# Patient Record
Sex: Female | Born: 1990 | Race: Black or African American | Hispanic: No | Marital: Single | Smoking: Former smoker
Health system: Southern US, Community
[De-identification: ages and names within clinical notes are randomized; demographics above are authoritative.]

## PROBLEM LIST (undated history)

## (undated) ENCOUNTER — Inpatient Hospital Stay (HOSPITAL_COMMUNITY): Payer: Self-pay

## (undated) DIAGNOSIS — B009 Herpesviral infection, unspecified: Secondary | ICD-10-CM

## (undated) DIAGNOSIS — F419 Anxiety disorder, unspecified: Secondary | ICD-10-CM

## (undated) DIAGNOSIS — R519 Headache, unspecified: Secondary | ICD-10-CM

## (undated) DIAGNOSIS — F329 Major depressive disorder, single episode, unspecified: Secondary | ICD-10-CM

## (undated) DIAGNOSIS — A549 Gonococcal infection, unspecified: Secondary | ICD-10-CM

## (undated) DIAGNOSIS — R51 Headache: Secondary | ICD-10-CM

## (undated) DIAGNOSIS — F319 Bipolar disorder, unspecified: Secondary | ICD-10-CM

## (undated) DIAGNOSIS — D649 Anemia, unspecified: Secondary | ICD-10-CM

## (undated) DIAGNOSIS — O1490 Unspecified pre-eclampsia, unspecified trimester: Secondary | ICD-10-CM

## (undated) DIAGNOSIS — R569 Unspecified convulsions: Secondary | ICD-10-CM

## (undated) DIAGNOSIS — Z3490 Encounter for supervision of normal pregnancy, unspecified, unspecified trimester: Secondary | ICD-10-CM

## (undated) HISTORY — PX: NO PAST SURGERIES: SHX2092

---
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
## 2008-01-19 ENCOUNTER — Emergency Department (HOSPITAL_COMMUNITY): Admission: EM | Admit: 2008-01-19 | Discharge: 2008-01-19 | Payer: Self-pay | Admitting: Emergency Medicine

## 2008-05-28 ENCOUNTER — Ambulatory Visit (HOSPITAL_COMMUNITY): Admission: RE | Admit: 2008-05-28 | Discharge: 2008-05-28 | Payer: Self-pay | Admitting: Obstetrics & Gynecology

## 2008-08-09 ENCOUNTER — Inpatient Hospital Stay (HOSPITAL_COMMUNITY): Admission: AD | Admit: 2008-08-09 | Discharge: 2008-08-09 | Payer: Self-pay | Admitting: Obstetrics & Gynecology

## 2008-08-23 ENCOUNTER — Ambulatory Visit: Payer: Self-pay | Admitting: Obstetrics and Gynecology

## 2008-08-23 ENCOUNTER — Inpatient Hospital Stay (HOSPITAL_COMMUNITY): Admission: AD | Admit: 2008-08-23 | Discharge: 2008-08-23 | Payer: Self-pay | Admitting: Obstetrics & Gynecology

## 2008-08-27 ENCOUNTER — Ambulatory Visit: Payer: Self-pay | Admitting: Family Medicine

## 2008-08-27 ENCOUNTER — Inpatient Hospital Stay (HOSPITAL_COMMUNITY): Admission: AD | Admit: 2008-08-27 | Discharge: 2008-08-30 | Payer: Self-pay | Admitting: Family Medicine

## 2009-05-27 ENCOUNTER — Emergency Department (HOSPITAL_COMMUNITY): Admission: EM | Admit: 2009-05-27 | Discharge: 2009-05-27 | Payer: Self-pay | Admitting: Family Medicine

## 2010-02-28 ENCOUNTER — Ambulatory Visit (HOSPITAL_COMMUNITY): Admission: RE | Admit: 2010-02-28 | Discharge: 2010-02-28 | Payer: Self-pay | Admitting: General Surgery

## 2010-04-15 ENCOUNTER — Emergency Department (HOSPITAL_COMMUNITY): Admission: EM | Admit: 2010-04-15 | Discharge: 2010-04-15 | Payer: Self-pay | Admitting: Family Medicine

## 2010-10-09 ENCOUNTER — Emergency Department (HOSPITAL_COMMUNITY): Admission: EM | Admit: 2010-10-09 | Discharge: 2010-08-14 | Payer: Self-pay | Admitting: Emergency Medicine

## 2011-01-15 LAB — URINALYSIS, ROUTINE W REFLEX MICROSCOPIC
Bilirubin Urine: NEGATIVE
Glucose, UA: NEGATIVE mg/dL
Hgb urine dipstick: NEGATIVE
Nitrite: NEGATIVE
Protein, ur: 30 mg/dL — AB
Specific Gravity, Urine: 1.033 — ABNORMAL HIGH (ref 1.005–1.030)
Urobilinogen, UA: 2 mg/dL — ABNORMAL HIGH (ref 0.0–1.0)
pH: 6 (ref 5.0–8.0)

## 2011-01-15 LAB — URINE MICROSCOPIC-ADD ON

## 2011-01-15 LAB — POCT PREGNANCY, URINE: Preg Test, Ur: NEGATIVE

## 2011-01-15 LAB — WET PREP, GENITAL
Trich, Wet Prep: NONE SEEN
Yeast Wet Prep HPF POC: NONE SEEN

## 2011-01-15 LAB — GC/CHLAMYDIA PROBE AMP, GENITAL
Chlamydia, DNA Probe: POSITIVE — AB
GC Probe Amp, Genital: NEGATIVE

## 2011-01-19 LAB — POCT URINALYSIS DIP (DEVICE)
Bilirubin Urine: NEGATIVE
Glucose, UA: NEGATIVE mg/dL
Hgb urine dipstick: NEGATIVE
Ketones, ur: NEGATIVE mg/dL
Nitrite: NEGATIVE
Protein, ur: NEGATIVE mg/dL
Specific Gravity, Urine: 1.025 (ref 1.005–1.030)
Urobilinogen, UA: 1 mg/dL (ref 0.0–1.0)
pH: 6.5 (ref 5.0–8.0)

## 2011-01-19 LAB — URINE CULTURE: Colony Count: 50000

## 2011-01-19 LAB — POCT PREGNANCY, URINE: Preg Test, Ur: NEGATIVE

## 2011-01-20 LAB — HCG, SERUM, QUALITATIVE: Preg, Serum: NEGATIVE

## 2011-01-20 LAB — DIFFERENTIAL
Basophils Absolute: 0.1 10*3/uL (ref 0.0–0.1)
Basophils Relative: 1 % (ref 0–1)
Eosinophils Absolute: 0.1 10*3/uL (ref 0.0–0.7)
Eosinophils Relative: 2 % (ref 0–5)
Lymphocytes Relative: 40 % (ref 12–46)
Lymphs Abs: 2.2 10*3/uL (ref 0.7–4.0)
Monocytes Absolute: 0.4 10*3/uL (ref 0.1–1.0)
Monocytes Relative: 8 % (ref 3–12)
Neutro Abs: 2.7 10*3/uL (ref 1.7–7.7)
Neutrophils Relative %: 49 % (ref 43–77)

## 2011-01-20 LAB — CBC
HCT: 37.6 % (ref 36.0–46.0)
Hemoglobin: 12.5 g/dL (ref 12.0–15.0)
MCHC: 33.1 g/dL (ref 30.0–36.0)
MCV: 84.9 fL (ref 78.0–100.0)
Platelets: 230 10*3/uL (ref 150–400)
RBC: 4.43 MIL/uL (ref 3.87–5.11)
RDW: 13.2 % (ref 11.5–15.5)
WBC: 5.5 10*3/uL (ref 4.0–10.5)

## 2011-02-08 LAB — GC/CHLAMYDIA PROBE AMP, GENITAL
Chlamydia, DNA Probe: NEGATIVE
GC Probe Amp, Genital: NEGATIVE

## 2011-02-08 LAB — POCT URINALYSIS DIP (DEVICE)
Bilirubin Urine: NEGATIVE
Glucose, UA: NEGATIVE mg/dL
Hgb urine dipstick: NEGATIVE
Ketones, ur: NEGATIVE mg/dL
Nitrite: NEGATIVE
Protein, ur: NEGATIVE mg/dL
Specific Gravity, Urine: 1.015 (ref 1.005–1.030)
Urobilinogen, UA: 2 mg/dL — ABNORMAL HIGH (ref 0.0–1.0)
pH: 6.5 (ref 5.0–8.0)

## 2011-02-08 LAB — WET PREP, GENITAL
Clue Cells Wet Prep HPF POC: NONE SEEN
Trich, Wet Prep: NONE SEEN
WBC, Wet Prep HPF POC: NONE SEEN
Yeast Wet Prep HPF POC: NONE SEEN

## 2011-02-10 ENCOUNTER — Inpatient Hospital Stay (INDEPENDENT_AMBULATORY_CARE_PROVIDER_SITE_OTHER)
Admission: RE | Admit: 2011-02-10 | Discharge: 2011-02-10 | Disposition: A | Discharge status: Home / Self Care | Payer: Medicaid Other | Source: Ambulatory Visit | Attending: Emergency Medicine | Admitting: Emergency Medicine

## 2011-02-10 DIAGNOSIS — K2970 Gastritis, unspecified, without bleeding: Secondary | ICD-10-CM

## 2011-02-10 DIAGNOSIS — R350 Frequency of micturition: Secondary | ICD-10-CM

## 2011-02-10 LAB — POCT URINALYSIS DIP (DEVICE)
Bilirubin Urine: NEGATIVE
Glucose, UA: NEGATIVE mg/dL
Nitrite: NEGATIVE
pH: 6 (ref 5.0–8.0)

## 2011-02-11 LAB — URINE CULTURE

## 2011-08-03 LAB — CBC
HCT: 28.8 — ABNORMAL LOW
Hemoglobin: 9 — ABNORMAL LOW
RDW: 14.7
WBC: 9.3

## 2012-04-14 IMAGING — US US PELVIS COMPLETE MODIFY
1 series · 13 of 25 positions shown · non-contrast
Comparison: None.

CLINICAL DATA: Abdominal pain.  Evaluate for torsion.  Right pelvic
pain.

TRANSABDOMINAL AND TRANSVAGINAL ULTRASOUND OF PELVIS
DOPPLER ULTRASOUND OF OVARIES
TECHNIQUE: Both transabdominal and transvaginal ultrasound
examinations of the pelvis were performed including evaluation of
the uterus, ovaries, adnexal regions, and pelvic cul-de-sac.
Transabdominal technique was performed for global imaging of the
pelvis.  Transvaginal technique was performed for detailed
evaluation of the endometrium and/or ovaries.  Color and duplex
Doppler ultrasound was utilized to evaluate blood flow to the
ovaries.

[Series 1: us pelvis complete modify · 0.19mm/px · 13 of 44 slices shown]
[im 1/44]
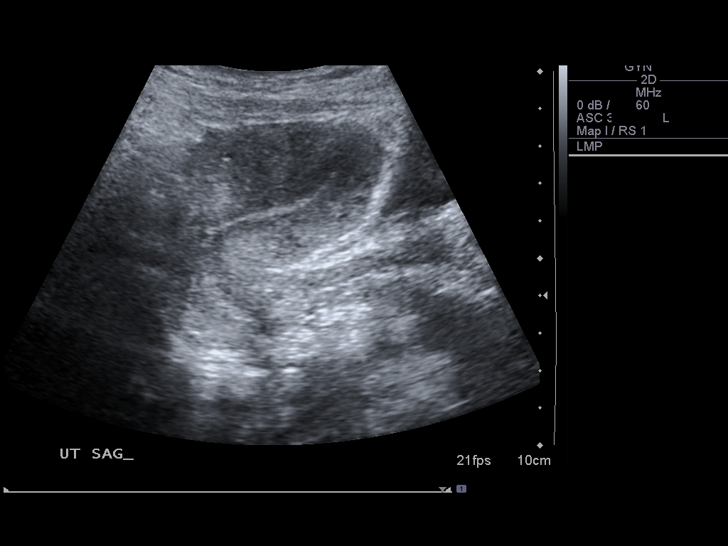
[im 4/44]
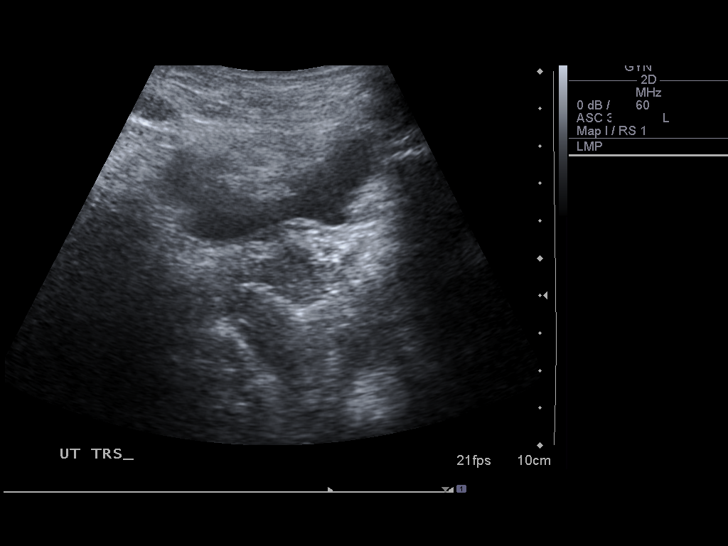
[im 8/44]
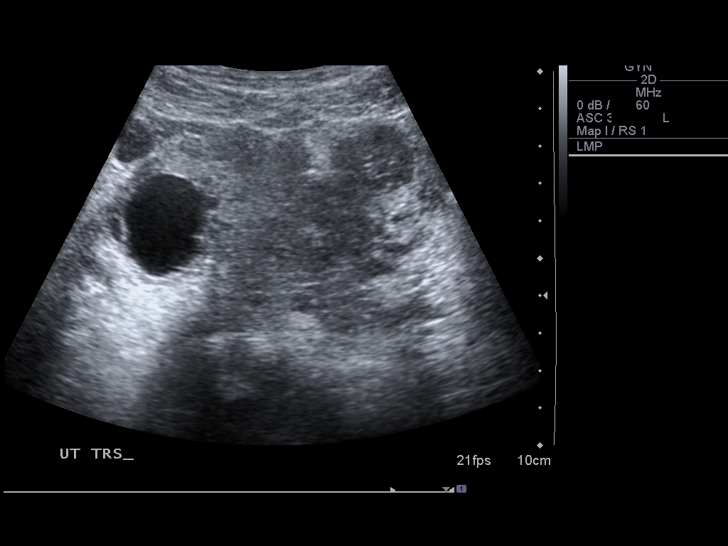
[im 11/44]
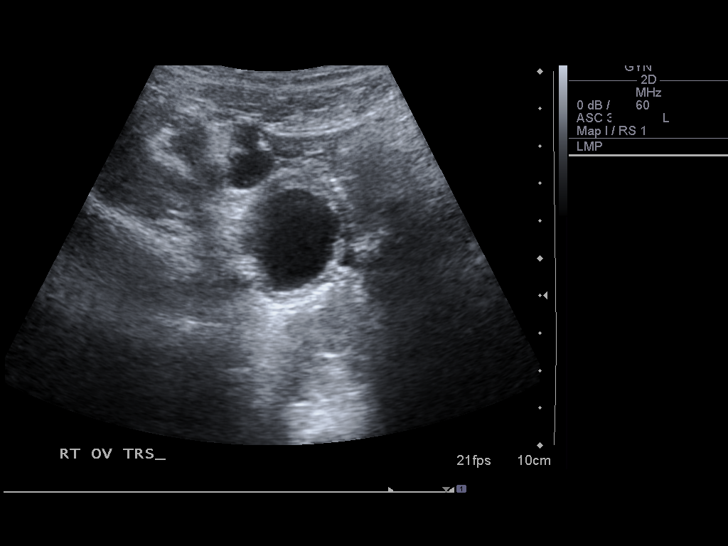
[im 15/44]
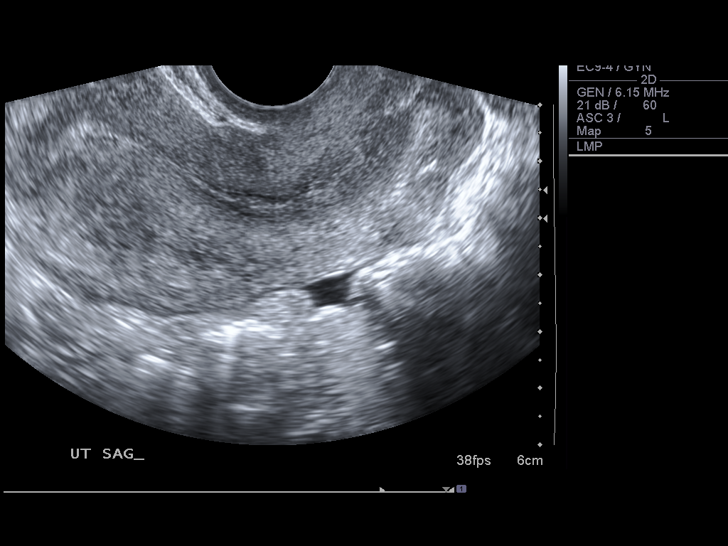
[im 18/44]
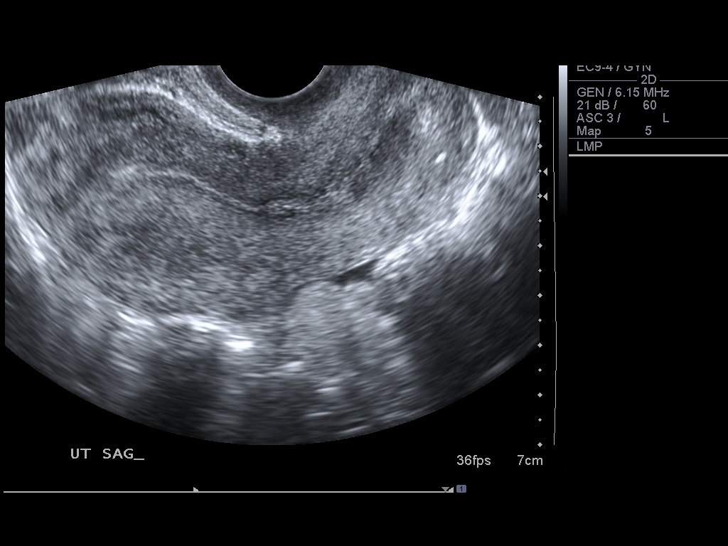
[im 22/44]
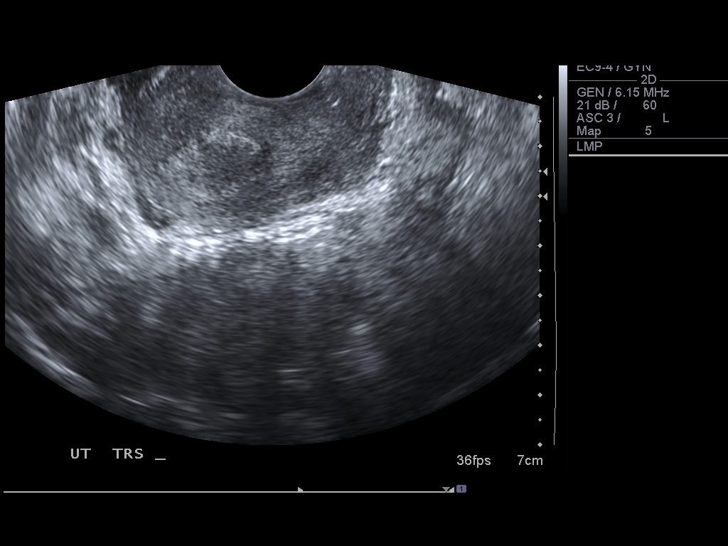
[im 26/44]
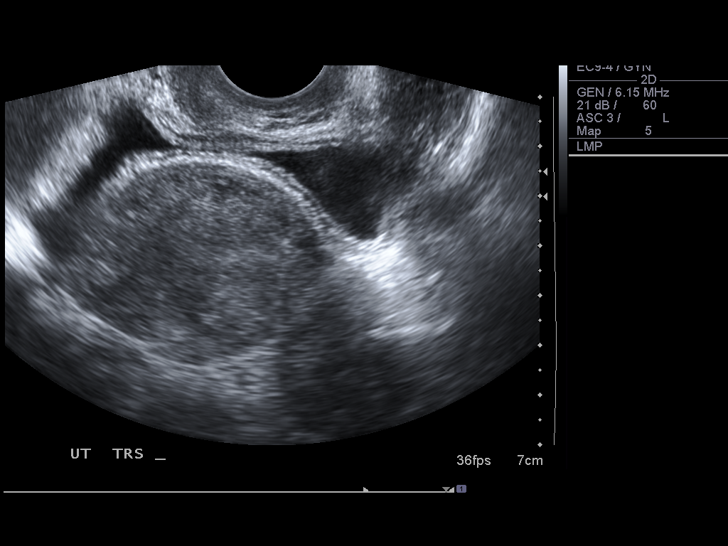
[im 29/44]
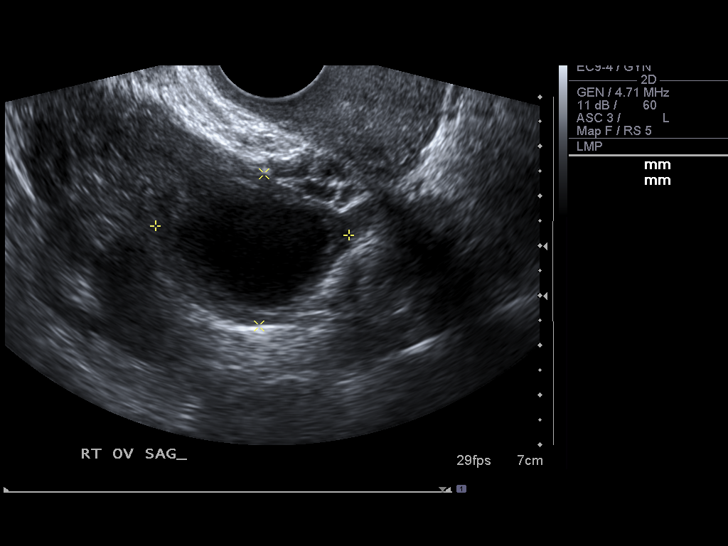
[im 33/44]
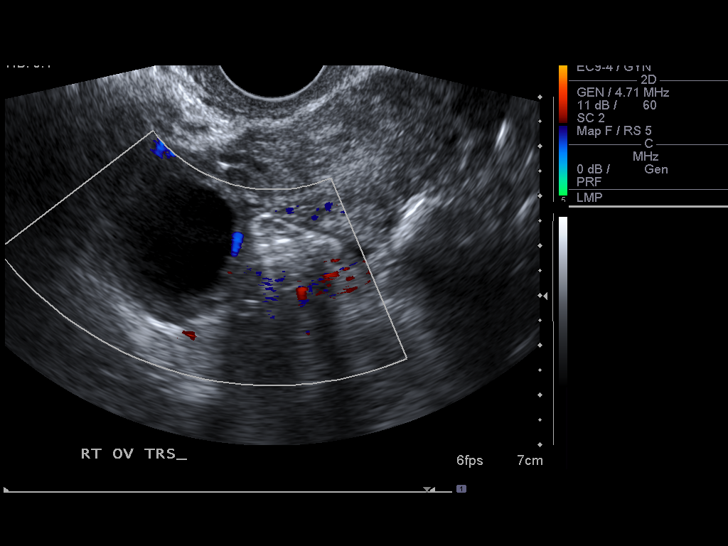
[im 36/44]
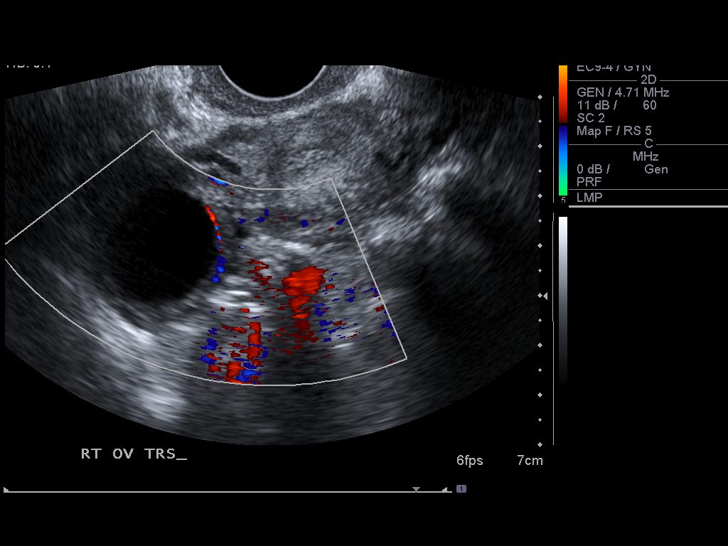
[im 40/44]
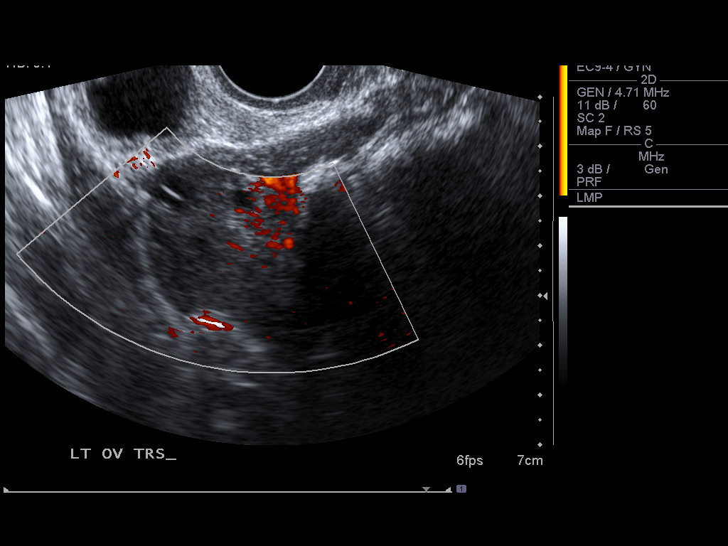
[im 44/44]
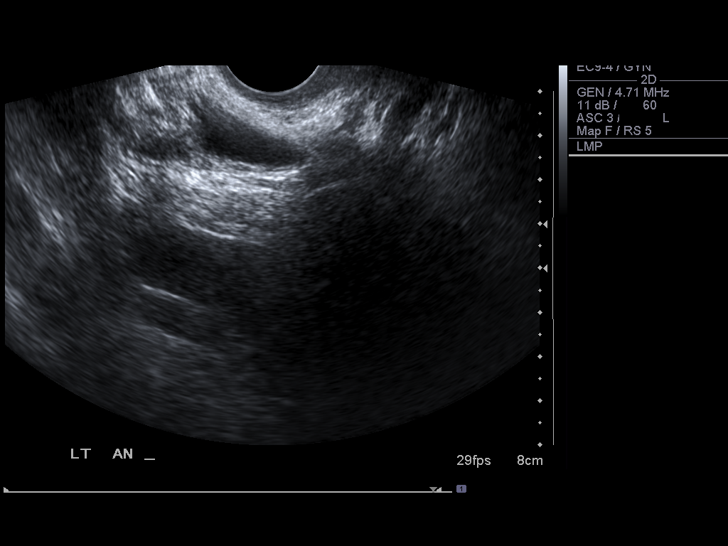

[13 of 25 positions shown; findings below may reference images not displayed]

FINDINGS: Uterus measures 9.3 x 4.5 x 5.9 cm, within normal limits.

Endometrium measures 3 mm, within normal limits.

Right Ovary measures 3.9 x 3.1 x 3.1 cm.  A dominant 2.7 x 2.3 x
2.2 cm cyst is present.  Normal arterial and venous waveforms are
present.

Left Ovary measures 3.7 x 3.2 x 1.9 cm.  It is of normal size and
echotexture.  Normal arterial and venous waveforms are present.

Other Findings:  A small amount of free fluid is present.
IMPRESSION: 1.  Normal arterial and venous waveforms are present in the ovaries
bilaterally without evidence for torsion.
2.  The dominant 2.7 cm cyst is present within the right ovary.
3.  Minimal free fluid is likely physiologic.

## 2012-10-19 ENCOUNTER — Encounter (HOSPITAL_COMMUNITY): Payer: Self-pay

## 2012-10-19 ENCOUNTER — Emergency Department (HOSPITAL_COMMUNITY)
Admission: EM | Admit: 2012-10-19 | Discharge: 2012-10-19 | Disposition: A | Discharge status: Home / Self Care | Payer: Medicaid Other | Attending: Emergency Medicine | Admitting: Emergency Medicine

## 2012-10-19 DIAGNOSIS — Z3202 Encounter for pregnancy test, result negative: Secondary | ICD-10-CM | POA: Insufficient documentation

## 2012-10-19 DIAGNOSIS — R350 Frequency of micturition: Secondary | ICD-10-CM | POA: Insufficient documentation

## 2012-10-19 DIAGNOSIS — R1084 Generalized abdominal pain: Secondary | ICD-10-CM | POA: Insufficient documentation

## 2012-10-19 DIAGNOSIS — N390 Urinary tract infection, site not specified: Secondary | ICD-10-CM | POA: Insufficient documentation

## 2012-10-19 DIAGNOSIS — N730 Acute parametritis and pelvic cellulitis: Secondary | ICD-10-CM | POA: Insufficient documentation

## 2012-10-19 LAB — WET PREP, GENITAL
Trich, Wet Prep: NONE SEEN
Yeast Wet Prep HPF POC: NONE SEEN

## 2012-10-19 LAB — URINE MICROSCOPIC-ADD ON

## 2012-10-19 LAB — URINALYSIS, ROUTINE W REFLEX MICROSCOPIC
Glucose, UA: NEGATIVE mg/dL
Hgb urine dipstick: NEGATIVE
Specific Gravity, Urine: 1.03 (ref 1.005–1.030)

## 2012-10-19 MED ORDER — MORPHINE SULFATE 4 MG/ML IJ SOLN
4.0000 mg | Freq: Once | INTRAMUSCULAR | Status: AC
  Administered 2012-10-19: 4 mg via INTRAVENOUS
  Filled 2012-10-19: qty 1

## 2012-10-19 MED ORDER — SULFAMETHOXAZOLE-TRIMETHOPRIM 800-160 MG PO TABS: 1.0000 | ORAL_TABLET | Freq: Two times a day (BID) | ORAL | Status: DC

## 2012-10-19 MED ORDER — OXYCODONE-ACETAMINOPHEN 5-325 MG PO TABS: 1.0000 | ORAL_TABLET | ORAL | Status: DC | PRN

## 2012-10-19 MED ORDER — AZITHROMYCIN 250 MG PO TABS
1000.0000 mg | ORAL_TABLET | Freq: Once | ORAL | Status: AC
  Administered 2012-10-19: 1000 mg via ORAL
  Filled 2012-10-19: qty 4

## 2012-10-19 MED ORDER — DEXTROSE 5 % IV SOLN
1.0000 g | Freq: Once | INTRAVENOUS | Status: AC
  Administered 2012-10-19: 1 g via INTRAVENOUS
  Filled 2012-10-19: qty 10

## 2012-10-19 MED ORDER — SODIUM CHLORIDE 0.9 % IV SOLN
Freq: Once | INTRAVENOUS | Status: AC
  Administered 2012-10-19: 21:00:00 via INTRAVENOUS

## 2012-10-19 MED ORDER — DOXYCYCLINE HYCLATE 100 MG PO CAPS: 100.0000 mg | ORAL_CAPSULE | Freq: Two times a day (BID) | ORAL | Status: DC

## 2012-10-19 NOTE — ED Provider Notes (Signed)
History     CSN: [id number]  Arrival date & time 10/19/12  1948   First MD Initiated Contact with Patient 10/19/12 2005      Chief Complaint  Patient presents with  . Near Syncope    (Consider location/radiation/quality/duration/timing/severity/associated sxs/prior treatment) HPI History provided by pt.   Pt presents w/ severe, diffuse lower abdominal pain for the past 2 weeks.  Radiates around to both side of low back and in the past 2-3 days has been aggravated by walking and changing positions.  Associated w/ increased urinary frequency, but no fever, N/V/D, vaginal or other urinary sx.  LMP 10/08/12.  Had unprotected sex approx 2 weeks prior to onset of sx.  No h/o STD.    History reviewed. No pertinent past medical history.  History reviewed. No pertinent past surgical history.  No family history on file.  History  Substance Use Topics  . Smoking status: Not on file  . Smokeless tobacco: Not on file  . Alcohol Use: Not on file    OB History    Grav Para Term Preterm Abortions TAB SAB Ect Mult Living                  Review of Systems  All other systems reviewed and are negative.    Allergies  Review of patient's allergies indicates no known allergies.  Home Medications  No current outpatient prescriptions on file.  BP 115/69  Pulse 114  Temp 98 F (36.7 C) (Oral)  Resp 20  SpO2 98%  Physical Exam  Nursing note and vitals reviewed. Constitutional: She is oriented to person, place, and time. She appears well-developed and well-nourished. No distress.       Pt appears uncomfortable  HENT:  Head: Normocephalic and atraumatic.  Eyes:       Normal appearance  Neck: Normal range of motion.  Cardiovascular: Normal rate and regular rhythm.        tachycardic  Pulmonary/Chest: Effort normal and breath sounds normal. No respiratory distress.  Abdominal: Soft. Bowel sounds are normal. She exhibits no distension and no mass. There is no rebound and no  guarding.       Diffuse, mod-severe lower abd ttp.   Genitourinary:       No CVA tenderness.  Nml external genitalia.  Mod amt purulent vaginal discharge.  Cervix closed and appears nml.  Cervical motion and bilateral adnexal ttp.  Musculoskeletal: Normal range of motion.  Neurological: She is alert and oriented to person, place, and time.  Skin: Skin is warm and dry. No rash noted.  Psychiatric: She has a normal mood and affect. Her behavior is normal.    ED Course  Procedures (including critical care time)  Labs Reviewed  URINALYSIS, ROUTINE W REFLEX MICROSCOPIC - Abnormal; Notable for the following:    Color, Urine AMBER (*)  BIOCHEMICALS MAY BE AFFECTED BY COLOR   APPearance TURBID (*)     Ketones, ur 15 (*)     Protein, ur 30 (*)     Urobilinogen, UA 4.0 (*)     Leukocytes, UA LARGE (*)     All other components within normal limits  WET PREP, GENITAL - Abnormal; Notable for the following:    Clue Cells Wet Prep HPF POC FEW (*)     WBC, Wet Prep HPF POC TOO NUMEROUS TO COUNT (*)     All other components within normal limits  URINE MICROSCOPIC-ADD ON - Abnormal; Notable for the following:  Squamous Epithelial / LPF MANY (*)     Bacteria, UA FEW (*)     All other components within normal limits  POCT PREGNANCY, URINE  GC/CHLAMYDIA PROBE AMP  URINE CULTURE   No results found.   1. Urinary tract infection   2. PID (acute pelvic inflammatory disease)       MDM  Healthy 21yo F presents w/ pelvic pain x 2 weeks + increased urinary frequency.  On exam, uncomfortable but non-toxic appearing, afebrile, mildly tachycardic, diffuse lower abd tenderness, purulent vaginal discharge and diffuse tenderness on bimanual exam.  U/A positive for infection and wet prep pos for tntc WBCs.  Will treat pt for UTI and PID.  Received IV NS, rocephin and morphine and po zithromax in ED and D/c'd home w/ percocet, doxycycline and bactrim.  Strict return precautions discussed.  She is aware that  [facility] has an ED.          Otilio Miu, PA-C 10/19/12 [phone]

## 2012-10-19 NOTE — ED Notes (Signed)
Pelvic cart at bedside. 

## 2012-10-19 NOTE — ED Notes (Signed)
Pt had near syncopal episode, constipation, pain in lower abd two weeks.  +orthostatics with EMS.

## 2012-10-19 NOTE — ED Notes (Signed)
Provided pt with crackers and sprite

## 2012-10-19 NOTE — ED Notes (Signed)
Pt had NS with EMS.

## 2012-10-20 LAB — GC/CHLAMYDIA PROBE AMP: CT Probe RNA: POSITIVE — AB

## 2012-10-20 NOTE — ED Provider Notes (Signed)
Medical screening examination/treatment/procedure(s) were performed by non-physician practitioner and as supervising physician I was immediately available for consultation/collaboration.   Kynisha Memon B. Aryav Wimberly, MD 10/20/12 1505 

## 2012-10-21 LAB — URINE CULTURE

## 2012-10-21 NOTE — ED Notes (Signed)
+  Chlamydia. Patient treated with Rocephin and Zithromax. [facility] faxed. 

## 2012-10-22 ENCOUNTER — Telehealth (HOSPITAL_COMMUNITY): Payer: Self-pay | Admitting: Emergency Medicine

## 2012-10-22 NOTE — ED Notes (Signed)
+  Urine. Patient given Doxycycline and Septra DS. No sensitivities listed. Chart sent to EDP office for review.

## 2012-10-23 NOTE — ED Notes (Signed)
Unable to contact patient via phone. Sent letter. °

## 2012-10-23 NOTE — ED Notes (Signed)
Patient treated per protocol. Chart sent to Medical Records. °

## 2012-10-31 NOTE — ED Notes (Signed)
Patient called after receiving letter..was infomed that resullts of her CT probe was positive and she was treat appropriately while here. Sexual educuation reviewed with patient..verbalized understanding

## 2012-11-05 NOTE — ED Notes (Signed)
Chart returned from EDP office. Per Azalia Bilis MD, likely contaminant. No indication for tx. PCP follow-up.

## 2012-11-23 ENCOUNTER — Other Ambulatory Visit: Payer: Medicaid Other

## 2013-02-26 ENCOUNTER — Encounter (HOSPITAL_COMMUNITY): Payer: Self-pay | Admitting: Emergency Medicine

## 2013-02-26 ENCOUNTER — Emergency Department (HOSPITAL_COMMUNITY)
Admission: EM | Admit: 2013-02-26 | Discharge: 2013-02-26 | Disposition: A | Discharge status: Home / Self Care | Payer: Medicaid Other | Attending: Emergency Medicine | Admitting: Emergency Medicine

## 2013-02-26 DIAGNOSIS — Z8669 Personal history of other diseases of the nervous system and sense organs: Secondary | ICD-10-CM | POA: Insufficient documentation

## 2013-02-26 DIAGNOSIS — G43909 Migraine, unspecified, not intractable, without status migrainosus: Secondary | ICD-10-CM | POA: Insufficient documentation

## 2013-02-26 HISTORY — DX: Unspecified convulsions: R56.9

## 2013-02-26 MED ORDER — IBUPROFEN 800 MG PO TABS: 800.0000 mg | ORAL_TABLET | Freq: Three times a day (TID) | ORAL | Status: DC | PRN

## 2013-02-26 MED ORDER — METOCLOPRAMIDE HCL 5 MG/ML IJ SOLN
10.0000 mg | Freq: Once | INTRAMUSCULAR | Status: AC
  Administered 2013-02-26: 10 mg via INTRAVENOUS
  Filled 2013-02-26: qty 2

## 2013-02-26 MED ORDER — DIPHENHYDRAMINE HCL 50 MG/ML IJ SOLN
25.0000 mg | Freq: Once | INTRAMUSCULAR | Status: AC
  Administered 2013-02-26: 25 mg via INTRAVENOUS
  Filled 2013-02-26: qty 1

## 2013-02-26 MED ORDER — KETOROLAC TROMETHAMINE 30 MG/ML IJ SOLN
30.0000 mg | Freq: Once | INTRAMUSCULAR | Status: AC
  Administered 2013-02-26: 30 mg via INTRAVENOUS
  Filled 2013-02-26: qty 1

## 2013-02-26 MED ORDER — SODIUM CHLORIDE 0.9 % IV BOLUS (SEPSIS)
2000.0000 mL | Freq: Once | INTRAVENOUS | Status: AC
  Administered 2013-02-26: 2000 mL via INTRAVENOUS

## 2013-02-26 NOTE — ED Notes (Signed)
Pt. Stated, I started having a headache about 2 weeks ago.

## 2013-02-26 NOTE — ED Provider Notes (Signed)
Medical screening examination/treatment/procedure(s) were performed by non-physician practitioner and as supervising physician I was immediately available for consultation/collaboration.  Anandi Abramo R. Eveline Sauve, MD 02/26/13 1537 

## 2013-02-26 NOTE — ED Provider Notes (Signed)
History     CSN: [id number]  Arrival date & time 02/26/13  1054   First MD Initiated Contact with Patient 02/26/13 1126      Chief Complaint  Patient presents with  . Headache    (Consider location/radiation/quality/duration/timing/severity/associated sxs/prior treatment) HPI Patient presents to the emergency department with migraine headache for the last 7.  Patient, states she's had mild headache over the last 2 weeks, but increased over the last 7 days.  Patient denies numbness, weakness, chest pain, shortness of breath, blurred vision, body aches, dysuria, dizziness, or syncope.  Patient  states she has photophobia.  Patient also, states she has nausea, as well. Past Medical History  Diagnosis Date  . Seizure     History reviewed. No pertinent past surgical history.  No family history on file.  History  Substance Use Topics  . Smoking status: Not on file  . Smokeless tobacco: Not on file  . Alcohol Use: Yes    OB History   Grav Para Term Preterm Abortions TAB SAB Ect Mult Living                  Review of Systems All other systems negative except as documented in the HPI. All pertinent positives and negatives as reviewed in the HPI. Allergies  Review of patient's allergies indicates no known allergies.  Home Medications   Current Outpatient Rx  Name  Route  Sig  Dispense  Refill  . Ibuprofen (IBU PO)   Oral   Take 3-4 tablets by mouth once.         . Naproxen Sodium (ALEVE PO)   Oral   Take 2 capsules by mouth 2 (two) times daily as needed (pain).           BP 119/74  Pulse 87  Temp(Src) 97.9 F (36.6 C) (Oral)  Resp 18  SpO2 100%  LMP 01/20/2013  Physical Exam  Nursing note and vitals reviewed. Constitutional: She is oriented to person, place, and time. She appears well-developed and well-nourished.  HENT:  Head: Normocephalic and atraumatic.  Mouth/Throat: Oropharynx is clear and moist.  Eyes: Pupils are equal, round, and reactive to  light.  Neck: Normal range of motion. Neck supple.  Cardiovascular: Normal rate, regular rhythm and normal heart sounds.  Exam reveals no gallop and no friction rub.   No murmur heard. Pulmonary/Chest: Effort normal and breath sounds normal. No respiratory distress.  Neurological: She is alert and oriented to person, place, and time. She has normal reflexes. No cranial nerve deficit. She exhibits normal muscle tone. Coordination normal.  Skin: Skin is warm and dry. No rash noted. No erythema.    ED Course  Procedures (including critical care time) Patient has no neurological deficits noted on exam.  Patient was treated for migraine headache, and has relief of her symptoms.  Patient is advised to return here for any worsening in her condition. MDM          Carlyle Dolly, PA-C 02/26/13 1329

## 2013-02-26 NOTE — ED Notes (Signed)
Pt requesting food.  States she feels much better and is ready to go home.

## 2013-04-22 ENCOUNTER — Emergency Department (HOSPITAL_COMMUNITY): Payer: Medicaid Other

## 2013-04-22 ENCOUNTER — Emergency Department (HOSPITAL_COMMUNITY)
Admission: EM | Admit: 2013-04-22 | Discharge: 2013-04-22 | Disposition: A | Discharge status: Home / Self Care | Payer: Medicaid Other | Attending: Emergency Medicine | Admitting: Emergency Medicine

## 2013-04-22 ENCOUNTER — Encounter (HOSPITAL_COMMUNITY): Payer: Self-pay | Admitting: *Deleted

## 2013-04-22 DIAGNOSIS — R0789 Other chest pain: Secondary | ICD-10-CM | POA: Insufficient documentation

## 2013-04-22 DIAGNOSIS — Z8669 Personal history of other diseases of the nervous system and sense organs: Secondary | ICD-10-CM | POA: Insufficient documentation

## 2013-04-22 DIAGNOSIS — N390 Urinary tract infection, site not specified: Secondary | ICD-10-CM | POA: Insufficient documentation

## 2013-04-22 DIAGNOSIS — R51 Headache: Secondary | ICD-10-CM | POA: Insufficient documentation

## 2013-04-22 DIAGNOSIS — Z3202 Encounter for pregnancy test, result negative: Secondary | ICD-10-CM | POA: Insufficient documentation

## 2013-04-22 DIAGNOSIS — N730 Acute parametritis and pelvic cellulitis: Secondary | ICD-10-CM | POA: Insufficient documentation

## 2013-04-22 DIAGNOSIS — R0602 Shortness of breath: Secondary | ICD-10-CM | POA: Insufficient documentation

## 2013-04-22 LAB — BASIC METABOLIC PANEL
BUN: 11 mg/dL (ref 6–23)
CO2: 27 mEq/L (ref 19–32)
Chloride: 105 mEq/L (ref 96–112)
GFR calc Af Amer: >90 mL/min (ref >90)
Potassium: 3.8 mEq/L (ref 3.5–5.1)

## 2013-04-22 LAB — URINALYSIS, ROUTINE W REFLEX MICROSCOPIC
Glucose, UA: NEGATIVE mg/dL
Ketones, ur: NEGATIVE mg/dL
Nitrite: NEGATIVE
Protein, ur: NEGATIVE mg/dL
Urobilinogen, UA: 1 mg/dL (ref 0.0–1.0)

## 2013-04-22 LAB — CBC WITH DIFFERENTIAL/PLATELET
Basophils Absolute: 0.1 10*3/uL (ref 0.0–0.1)
Basophils Relative: 1 % (ref 0–1)
Eosinophils Absolute: 0.2 10*3/uL (ref 0.0–0.7)
Eosinophils Relative: 4 % (ref 0–5)
HCT: 37.8 % (ref 36.0–46.0)
Hemoglobin: 12 g/dL (ref 12.0–15.0)
Lymphocytes Relative: 29 % (ref 12–46)
MCHC: 31.7 g/dL (ref 30.0–36.0)
MCV: 82.7 fL (ref 78.0–100.0)
Neutro Abs: 3.8 10*3/uL (ref 1.7–7.7)

## 2013-04-22 LAB — URINE MICROSCOPIC-ADD ON

## 2013-04-22 LAB — WET PREP, GENITAL: Yeast Wet Prep HPF POC: NONE SEEN

## 2013-04-22 LAB — POCT PREGNANCY, URINE: Preg Test, Ur: NEGATIVE

## 2013-04-22 MED ORDER — OXYCODONE-ACETAMINOPHEN 5-325 MG PO TABS: 1.0000 | ORAL_TABLET | Freq: Four times a day (QID) | ORAL | Status: DC | PRN

## 2013-04-22 MED ORDER — METRONIDAZOLE 500 MG PO TABS: 500.0000 mg | ORAL_TABLET | Freq: Two times a day (BID) | ORAL | Status: DC

## 2013-04-22 MED ORDER — PROCHLORPERAZINE EDISYLATE 5 MG/ML IJ SOLN
10.0000 mg | Freq: Once | INTRAMUSCULAR | Status: AC
  Administered 2013-04-22: 10 mg via INTRAVENOUS
  Filled 2013-04-22: qty 2

## 2013-04-22 MED ORDER — DOXYCYCLINE HYCLATE 100 MG PO CAPS: 100.0000 mg | ORAL_CAPSULE | Freq: Two times a day (BID) | ORAL | Status: DC

## 2013-04-22 MED ORDER — CEFTRIAXONE SODIUM 250 MG IJ SOLR
250.0000 mg | Freq: Once | INTRAMUSCULAR | Status: AC
  Administered 2013-04-22: 250 mg via INTRAMUSCULAR
  Filled 2013-04-22: qty 250

## 2013-04-22 MED ORDER — MORPHINE SULFATE 4 MG/ML IJ SOLN
4.0000 mg | Freq: Once | INTRAMUSCULAR | Status: AC
  Administered 2013-04-22: 4 mg via INTRAVENOUS
  Filled 2013-04-22: qty 1

## 2013-04-22 MED ORDER — ONDANSETRON HCL 4 MG/2ML IJ SOLN
4.0000 mg | Freq: Once | INTRAMUSCULAR | Status: AC
  Administered 2013-04-22: 4 mg via INTRAVENOUS
  Filled 2013-04-22: qty 2

## 2013-04-22 NOTE — ED Notes (Signed)
Secretary paged [place] tech

## 2013-04-22 NOTE — ED Notes (Signed)
Bed:WA22<BR> Expected date:<BR> Expected time:<BR> Means of arrival:<BR> Comments:<BR>

## 2013-04-22 NOTE — ED Notes (Signed)
Pt states she has been having lower abd pain that has been getting worse. Headache started this morning. No other associated symptoms with complaints.

## 2013-04-22 NOTE — ED Provider Notes (Signed)
History     CSN: [id number]  Arrival date & time 04/22/13  3244   First MD Initiated Contact with Patient 04/22/13 [phone]      Chief Complaint  Patient presents with  . Abdominal Pain  . Headache    (Consider location/radiation/quality/duration/timing/severity/associated sxs/prior treatment) Patient is a 22 y.o. female presenting with abdominal pain and headaches. The history is provided by the patient.  Abdominal Pain This is a new problem. Associated symptoms include abdominal pain, headaches and shortness of breath. Pertinent negatives include no chest pain.  Headache Associated symptoms: abdominal pain   Associated symptoms: no back pain, no diarrhea, no pain, no nausea, no neck stiffness, no numbness and no vomiting    patient's had lower abdominal pain for the last week. States it is dull and sharp. It is constant. It does not change with eating. No change in her bowel habits. No dysuria. No vaginal bleeding or discharge. Her last period was the end of March. She states she does not have regular periods. No fevers. No change in her appetite. No swelling of her abdomen. She has not had pain like this before. She also said to headache like some of her previous migraines. She states it started today. She also states she woke today with some tightness in her chest. No fevers. No cough.  Past Medical History  Diagnosis Date  . Seizure     History reviewed. No pertinent past surgical history.  No family history on file.  History  Substance Use Topics  . Smoking status: Never Smoker   . Smokeless tobacco: Not on file  . Alcohol Use: Yes    OB History   Grav Para Term Preterm Abortions TAB SAB Ect Mult Living                  Review of Systems  Constitutional: Negative for activity change and appetite change.  HENT: Negative for neck stiffness.   Eyes: Negative for pain.  Respiratory: Positive for shortness of breath. Negative for chest tightness.   Cardiovascular:  Negative for chest pain and leg swelling.  Gastrointestinal: Positive for abdominal pain. Negative for nausea, vomiting and diarrhea.  Genitourinary: Negative for flank pain.  Musculoskeletal: Negative for back pain.  Skin: Negative for rash.  Neurological: Positive for headaches. Negative for weakness and numbness.  Psychiatric/Behavioral: Negative for behavioral problems.    Allergies  Review of patient's allergies indicates no known allergies.  Home Medications   Current Outpatient Rx  Name  Route  Sig  Dispense  Refill  . doxycycline (VIBRAMYCIN) 100 MG capsule   Oral   Take 1 capsule (100 mg total) by mouth 2 (two) times daily.   28 capsule   0   . metroNIDAZOLE (FLAGYL) 500 MG tablet   Oral   Take 1 tablet (500 mg total) by mouth 2 (two) times daily.   14 tablet   0   . oxyCODONE-acetaminophen (PERCOCET/ROXICET) 5-325 MG per tablet   Oral   Take 1-2 tablets by mouth every 6 (six) hours as needed for pain.   6 tablet   0     BP 116/59  Pulse 90  Temp(Src) 98.4 F (36.9 C) (Oral)  Resp 16  SpO2 99%  LMP 03/29/2013  Physical Exam  Nursing note and vitals reviewed. Constitutional: She is oriented to person, place, and time. She appears well-developed and well-nourished.  HENT:  Head: Normocephalic and atraumatic.  Eyes: EOM are normal. Pupils are equal, round, and reactive to  light.  Neck: Normal range of motion. Neck supple.  Cardiovascular: Normal rate, regular rhythm and normal heart sounds.   No murmur heard. Pulmonary/Chest: Effort normal and breath sounds normal. No respiratory distress. She has no wheezes. She has no rales.  Abdominal: Soft. Bowel sounds are normal. She exhibits no distension. There is tenderness. There is no rebound and no guarding.  Tenderness in the midline. No mass. No rebound or guarding  Musculoskeletal: Normal range of motion.  Neurological: She is alert and oriented to person, place, and time. No cranial nerve deficit.  Skin:  Skin is warm and dry.  Psychiatric: She has a normal mood and affect. Her speech is normal.   pelvic exam showed thick white cervical discharge. No adnexal tenderness. There was cervical motion tenderness.  ED Course  Procedures (including critical care time)  Labs Reviewed  WET PREP, GENITAL - Abnormal; Notable for the following:    Clue Cells Wet Prep HPF POC FEW (*)    WBC, Wet Prep HPF POC MODERATE (*)    All other components within normal limits  URINALYSIS, ROUTINE W REFLEX MICROSCOPIC - Abnormal; Notable for the following:    APPearance CLOUDY (*)    Specific Gravity, Urine 1.031 (*)    Leukocytes, UA LARGE (*)    All other components within normal limits  URINE MICROSCOPIC-ADD ON - Abnormal; Notable for the following:    Squamous Epithelial / LPF MANY (*)    Bacteria, UA MANY (*)    All other components within normal limits  GC/CHLAMYDIA PROBE AMP  URINE CULTURE  BASIC METABOLIC PANEL  CBC WITH DIFFERENTIAL  POCT PREGNANCY, URINE   US Transvaginal Non-ob  04/22/2013   *RADIOLOGY REPORT*  Clinical Data: Abdominal pain  TRANSABDOMINAL AND TRANSVAGINAL ULTRASOUND OF PELVIS  Technique:  Both transabdominal and transvaginal ultrasound examinations of the pelvis were performed.  Transabdominal technique was performed for global imaging of the pelvis including uterus, ovaries, adnexal regions, and pelvic cul-de-sac.  It was necessary to proceed with endovaginal exam following the transabdominal exam to visualize the endometrium, uterus and ovaries.  Comparison:  08/14/2010  Findings: Uterus:  The uterus measures 8.5 x 4.7 x 6.5 cm.  No mass identified.  Endometrium: Appears normal measuring 5.7 mm.  Right ovary: Appears normal measuring 4.1 x 2.2 x 2.2 cm.  Left ovary: Appears normal measuring 4.0 x 2.3 x 2.6 cm.  Other Findings:  No free fluid identified.  IMPRESSION: Normal study.  No evidence of pelvic mass or other significant abnormality.   Original Report Authenticated By: Signa Kell, M.D.   US Pelvis Complete  04/22/2013   *RADIOLOGY REPORT*  Clinical Data: Abdominal pain  TRANSABDOMINAL AND TRANSVAGINAL ULTRASOUND OF PELVIS  Technique:  Both transabdominal and transvaginal ultrasound examinations of the pelvis were performed.  Transabdominal technique was performed for global imaging of the pelvis including uterus, ovaries, adnexal regions, and pelvic cul-de-sac.  It was necessary to proceed with endovaginal exam following the transabdominal exam to visualize the endometrium, uterus and ovaries.  Comparison:  08/14/2010  Findings: Uterus:  The uterus measures 8.5 x 4.7 x 6.5 cm.  No mass identified.  Endometrium: Appears normal measuring 5.7 mm.  Right ovary: Appears normal measuring 4.1 x 2.2 x 2.2 cm.  Left ovary: Appears normal measuring 4.0 x 2.3 x 2.6 cm.  Other Findings:  No free fluid identified.  IMPRESSION: Normal study.  No evidence of pelvic mass or other significant abnormality.   Original Report Authenticated By: Signa Kell,  M.D.     1. PID (acute pelvic inflammatory disease)   2. UTI (urinary tract infection)       MDM  Patient with lower abdominal pain. Likely PID and possible urinary tract infection. Patient be treated with antibiotics. Urine culture is sent. Ultrasound did not show tubo-ovarian abscess.        Juliet Rude. Rubin Payor, MD 04/22/13 1544

## 2013-04-23 LAB — URINE CULTURE

## 2013-04-28 ENCOUNTER — Telehealth (HOSPITAL_COMMUNITY): Payer: Self-pay | Admitting: *Deleted

## 2013-04-28 NOTE — ED Notes (Signed)
+   Chlamydia Patient treated with Rocephin And Zithromax-[facility] faxed 

## 2013-04-30 ENCOUNTER — Telehealth (HOSPITAL_COMMUNITY): Payer: Self-pay | Admitting: Emergency Medicine

## 2013-05-05 ENCOUNTER — Telehealth (HOSPITAL_COMMUNITY): Payer: Self-pay | Admitting: Emergency Medicine

## 2013-05-06 NOTE — ED Notes (Signed)
Unable to contact patient via phone. Sent letter. °

## 2013-05-10 ENCOUNTER — Telehealth (HOSPITAL_COMMUNITY): Payer: Self-pay | Admitting: Emergency Medicine

## 2013-06-05 NOTE — ED Notes (Signed)
No response after 30 days .Chart closed out and sent to Medical Records.  

## 2013-07-18 ENCOUNTER — Emergency Department (HOSPITAL_COMMUNITY)
Admission: EM | Admit: 2013-07-18 | Discharge: 2013-07-18 | Disposition: A | Discharge status: Home / Self Care | Payer: Medicaid Other | Attending: Emergency Medicine | Admitting: Emergency Medicine

## 2013-07-18 ENCOUNTER — Encounter (HOSPITAL_COMMUNITY): Payer: Self-pay | Admitting: *Deleted

## 2013-07-18 DIAGNOSIS — R0602 Shortness of breath: Secondary | ICD-10-CM | POA: Insufficient documentation

## 2013-07-18 DIAGNOSIS — Z3201 Encounter for pregnancy test, result positive: Secondary | ICD-10-CM | POA: Insufficient documentation

## 2013-07-18 DIAGNOSIS — O2390 Unspecified genitourinary tract infection in pregnancy, unspecified trimester: Secondary | ICD-10-CM | POA: Insufficient documentation

## 2013-07-18 DIAGNOSIS — R569 Unspecified convulsions: Secondary | ICD-10-CM | POA: Insufficient documentation

## 2013-07-18 DIAGNOSIS — R112 Nausea with vomiting, unspecified: Secondary | ICD-10-CM | POA: Insufficient documentation

## 2013-07-18 DIAGNOSIS — Z3490 Encounter for supervision of normal pregnancy, unspecified, unspecified trimester: Secondary | ICD-10-CM

## 2013-07-18 DIAGNOSIS — N390 Urinary tract infection, site not specified: Secondary | ICD-10-CM | POA: Insufficient documentation

## 2013-07-18 LAB — URINE MICROSCOPIC-ADD ON

## 2013-07-18 LAB — URINALYSIS, ROUTINE W REFLEX MICROSCOPIC
Glucose, UA: NEGATIVE mg/dL
Hgb urine dipstick: NEGATIVE
Protein, ur: 30 mg/dL — AB
Specific Gravity, Urine: 1.029 (ref 1.005–1.030)

## 2013-07-18 LAB — PREGNANCY, URINE: Preg Test, Ur: POSITIVE — AB

## 2013-07-18 MED ORDER — ONDANSETRON HCL 4 MG PO TABS: 4.0000 mg | ORAL_TABLET | Freq: Four times a day (QID) | ORAL | Status: DC

## 2013-07-18 MED ORDER — CEPHALEXIN 500 MG PO CAPS: 500.0000 mg | ORAL_CAPSULE | Freq: Four times a day (QID) | ORAL | Status: DC

## 2013-07-18 NOTE — ED Notes (Addendum)
Pt presents to department for evaluation of nausea/vomiting x2 weeks. States she took at home pregnancy test and was positive. Also states SOB. Respirations unlabored. Pt is alert and oriented x4. No signs of acute distress noted. LMP: 05/29/13.

## 2013-07-18 NOTE — ED Provider Notes (Signed)
CSN: [id number]     Arrival date & time 07/18/13  0945 History   First MD Initiated Contact with Patient 07/18/13 1029     Chief Complaint  Patient presents with  . Emesis   (Consider location/radiation/quality/duration/timing/severity/associated sxs/prior Treatment) The history is provided by the patient. No language interpreter was used.  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 Patient reported that she has had shortness of breath with exertion for the past 2 weeks, reported that when she takes a deep breath in she does not feel like she is getting a full deep breath in. Reported that the shortness of breath only occurs with activity, reported that she breathes fine and has no difficulty at rest. Patient reported that she has been having nausea and emesis for the past week. Reported that every time she eats something she feels nausea and a couple of minutes later she vomits. Patient reported that she has at least 4 episodes of emesis per day. Reported that she took a pregnancy test at home, stated that it was positive. Patient reported that her LMP was anywhere between May 29, 2013 and June 02, 2013. Reported that she traveled to [facility] June 15, 2013 and then returned June 18, 2013 via car - reported that she took breaks during the trip. Patient denied difficulty swallowing, chest pain, difficulty breathing, cough, nasal congestion, neck pain, back pain, dizziness, blurred vision, visual distortions, headaches, sick contacts, numbness, tingling, dysuria, diarrhea, melena, vaginal discharge, vaginal bleeding, fever, chills, pelvic pain, abdominal cramping, abdominal pain. PCP none   Past Medical History  Diagnosis Date  . Seizure    History reviewed. No pertinent past surgical history. History reviewed. No pertinent family history. History  Substance Use Topics  . Smoking  status: Never Smoker   . Smokeless tobacco: Not on file  . Alcohol Use: Yes   OB History   Grav Para Term Preterm Abortions TAB SAB Ect Mult Living                 Review of Systems  Constitutional: Negative for fever and chills.  HENT: Negative for congestion, sore throat, trouble swallowing, neck pain and neck stiffness.   Eyes: Negative for visual disturbance.  Respiratory: Positive for shortness of breath. Negative for cough and chest tightness.   Cardiovascular: Negative for chest pain.  Gastrointestinal: Positive for nausea and vomiting. Negative for abdominal pain, diarrhea, blood in stool and anal bleeding.  Genitourinary: Negative for dysuria, decreased urine volume, vaginal bleeding, vaginal discharge, difficulty urinating, vaginal pain and pelvic pain.  Musculoskeletal: Negative for back pain and arthralgias.  Neurological: Negative for dizziness, weakness and headaches.  All other systems reviewed and are negative.    Allergies  Review of patient's allergies indicates no known allergies.  Home Medications   Current Outpatient Rx  Name  Route  Sig  Dispense  Refill  . cephALEXin (KEFLEX) 500 MG capsule   Oral   Take 1 capsule (500 mg total) by mouth 4 (four) times daily.   40 capsule   0   . ondansetron (ZOFRAN) 4 MG tablet   Oral   Take 1 tablet (4 mg total) by mouth every 6 (six) hours.   12 tablet   0    BP 101/70  Pulse 97  Temp(Src) 98.3 F (36.8 C) (Oral)  Resp 18  SpO2 98%  LMP 05/29/2013 Physical  Exam  Nursing note and vitals reviewed. Constitutional: She is oriented to person, place, and time. She appears well-developed and well-nourished. No distress.  HENT:  Head: Normocephalic and atraumatic.  Mouth/Throat: Oropharynx is clear and moist. No oropharyngeal exudate.  Eyes: Conjunctivae and EOM are normal. Pupils are equal, round, and reactive to light. Right eye exhibits no discharge. Left eye exhibits no discharge.  Neck: Normal range of  motion. Neck supple.  Negative neck stiffness Negative nuchal rigidity Negative lymphadenopathy  Cardiovascular: Normal rate, regular rhythm and normal heart sounds.  Exam reveals no friction rub.   No murmur heard. Pulses:      Radial pulses are 2+ on the right side, and 2+ on the left side.       Dorsalis pedis pulses are 2+ on the right side, and 2+ on the left side.  Cap refill < 3 seconds bilaterally Negative swelling to ankles and legs bilaterally Negative erythema noted to the legs bilaterally Negative pain upon palpation to the calf bilaterally  Pulmonary/Chest: Effort normal and breath sounds normal. No respiratory distress. She has no wheezes. She has no rales. She exhibits no tenderness.  Abdominal: Soft. Bowel sounds are normal. There is no tenderness.  Musculoskeletal: Normal range of motion.  Lymphadenopathy:    She has no cervical adenopathy.  Neurological: She is alert and oriented to person, place, and time. She exhibits normal muscle tone. Coordination normal.  Strength 5+/5+ to upper and lower extremities bilaterally with resistance, equal distribution identified.  Follows commands accordingly, answers questions appropriately  Skin: Skin is warm and dry. No rash noted. She is not diaphoretic. No erythema.  Psychiatric: She has a normal mood and affect. Her behavior is normal. Thought content normal.    ED Course  Procedures (including critical care time)  11:00 AM Spoke with Dr. Freida Busman regarding case - as per physician, did not recommend [place] or beta hCG work-up. Discussed complaint of shortness of breath with exertion and recent road trip, as per physician reported less likely to be a  PE secondary to travel being a month ago, as per physician did not recommend work-up.   11:20 AM Spoke with patient regarding lab findings. Patient denied feeling nauseous. Reported that she has not had an episode of emesis today. Patient calm and relaxing, laying in bed - texting on  her cellphone.   Labs Review Labs Reviewed  URINALYSIS, ROUTINE W REFLEX MICROSCOPIC - Abnormal; Notable for the following:    Color, Urine AMBER (*)    APPearance HAZY (*)    Bilirubin Urine SMALL (*)    Ketones, ur 15 (*)    Protein, ur 30 (*)    Leukocytes, UA MODERATE (*)    All other components within normal limits  PREGNANCY, URINE - Abnormal; Notable for the following:    Preg Test, Ur POSITIVE (*)    All other components within normal limits  URINE MICROSCOPIC-ADD ON - Abnormal; Notable for the following:    Squamous Epithelial / LPF MANY (*)    All other components within normal limits   Imaging Review No results found.  MDM   1. Pregnancy at early stage   2. UTI (urinary tract infection)   3. Shortness of breath      Patient presenting to the ED with shortness of breath for 2 weeks and nausea/vomiting for the past week. Patient reported that she took a pregnancy test at home a couple of days ago, which came back positive. Denied chest pain,  difficulty breathing, abdominal pain, abdominal cramping, vaginal bleeding, vaginal discharge, pelvic pain.  Alert and oriented. Lungs clear to auscultation bilaterally to upper and lower lobes. Negative pain upon palpation to the chest wall. Heart rate and rhythm normal. Pulses palpable, distal and proximal. Cap refill < 3 seconds. Negative swelling or erythema noted to the lower extremities bilaterally. Negative pain with palpation to the calves. Full ROM to extremities. Abdomen exam unremarkable, BS normoactive in all 4 quadrants, negative pain upon palpation. Positive urine pregnancy test. Urine noted infection with moderate leukocytes noted, elevated WBC of 7-10.  Patient stable, afebrile. Doubt PE - patient took breaks during the trip, non-tachypneic, non-tachycardic, negative hemoptysis, negative chest pain, oxygen saturation stable. PERC score 0. Suspicion for shortness of breath with mild cough, beginnings of URI, viral versus  allergic. Nausea and vomiting due to pregnancy, hyperemesis gravidum. UTI noted to urine. Discharged patient with Zofran and Keflex. Referred patient to [facility] for ultrasound, labs for first trimester to be performed. Discussed with patient that she needs to start prenatal vitamins. Discussed with patient what symptoms to watch out for regarding issues with pregnancy. Discussed with patient to rest and stay hydrated. Discussed with patient to continue to monitor symptoms and if symptoms are to worsen or change to report back to the ED - strict return instructions given.  Patient agreed to plan of care, understood, all questions answered.    Raymon Mutton, PA-C 07/18/13 1735

## 2013-07-18 NOTE — ED Notes (Signed)
Pt reports feeling sob, cough and n/v x 2 weeks. Took pregnancy test yesterday and it was +. lmp July.

## 2013-07-19 NOTE — ED Provider Notes (Signed)
Medical screening examination/treatment/procedure(s) were performed by non-physician practitioner and as supervising physician I was immediately available for consultation/collaboration.   Haili Donofrio B. Marcus Schwandt, MD 07/19/13 1842 

## 2013-07-20 ENCOUNTER — Telehealth: Payer: Self-pay | Admitting: *Deleted

## 2013-07-20 NOTE — Telephone Encounter (Signed)
Lehua left a message that was hard to hear, but states was told to call us to schedule an ultrasound.  Per chart review Arrie is pregnant and went to ER for SOB, n&v, is scheduled for first prenatal visit 08/23/13. Called [place] and she said they told her to call us about getting the ultrasound. She states her period was end of July or 1st day August.  Informed her I would schedule per protocol and call her back.  Reveiwed protocol and will schedule when patient is at her visit as she is sure period was end of July or 1st of August. Will let provider at visit decide if we should schedule for routine 18-20 weeks or sooner if exam indicates . Called [facility] and left a message we will schedule the appointment we discussed at her visit , please call if you wish to discuss this.

## 2013-08-18 ENCOUNTER — Encounter (HOSPITAL_COMMUNITY): Payer: Self-pay | Admitting: *Deleted

## 2013-08-18 ENCOUNTER — Inpatient Hospital Stay (HOSPITAL_COMMUNITY)
Admission: AD | Admit: 2013-08-18 | Discharge: 2013-08-18 | Disposition: A | Discharge status: Home / Self Care | Payer: Medicaid Other | Source: Ambulatory Visit | Attending: Obstetrics and Gynecology | Admitting: Obstetrics and Gynecology

## 2013-08-18 ENCOUNTER — Inpatient Hospital Stay (HOSPITAL_COMMUNITY): Payer: Medicaid Other

## 2013-08-18 DIAGNOSIS — O99891 Other specified diseases and conditions complicating pregnancy: Secondary | ICD-10-CM | POA: Insufficient documentation

## 2013-08-18 DIAGNOSIS — R0602 Shortness of breath: Secondary | ICD-10-CM | POA: Insufficient documentation

## 2013-08-18 DIAGNOSIS — Z3482 Encounter for supervision of other normal pregnancy, second trimester: Secondary | ICD-10-CM

## 2013-08-18 DIAGNOSIS — O2390 Unspecified genitourinary tract infection in pregnancy, unspecified trimester: Secondary | ICD-10-CM | POA: Insufficient documentation

## 2013-08-18 DIAGNOSIS — B9689 Other specified bacterial agents as the cause of diseases classified elsewhere: Secondary | ICD-10-CM | POA: Insufficient documentation

## 2013-08-18 DIAGNOSIS — N760 Acute vaginitis: Secondary | ICD-10-CM

## 2013-08-18 DIAGNOSIS — A499 Bacterial infection, unspecified: Secondary | ICD-10-CM

## 2013-08-18 LAB — WET PREP, GENITAL: Trich, Wet Prep: NONE SEEN

## 2013-08-18 MED ORDER — METRONIDAZOLE 500 MG PO TABS: 500.0000 mg | ORAL_TABLET | Freq: Two times a day (BID) | ORAL | Status: DC

## 2013-08-18 NOTE — MAU Provider Note (Signed)
History     CSN: [id number]  Arrival date and time: 08/18/13 1352   None     Chief Complaint  Patient presents with  . Shortness of Breath   HPI  Deanna Murray is 22 y.o. [id number] [redacted]w[redacted]d weeks presenting with trouble breathing when she takes a deep breath.  This began at 10:30pm. Lying down makes it worse, sitting helps.  Denies hx of asthma.  Tried honey without relief.  Denies URI, exposure to illness.  States she feels a little better now.  Having lower abdominal pain that began 2 days ago.  Worst pain 6/10, now 5/10.  Was seen at [facility] 9/14 with same sx.  Pregnancy confirmed that date.  Denies vaginal discharge or bleeding.  Sexually active X 1 partner X 1 yr.  Unplanned pregnancy.  Not using contraception.  Patient of [facility]--has appt on 10/20.     Past Medical History  Diagnosis Date  . Seizure     Past Surgical History  Procedure Laterality Date  . No past surgeries      Family History  Problem Relation Age of Onset  . Alcohol abuse Neg Hx   . Arthritis Neg Hx   . Asthma Neg Hx   . Birth defects Neg Hx   . Cancer Neg Hx   . COPD Neg Hx   . Depression Neg Hx   . Diabetes Neg Hx   . Drug abuse Neg Hx   . Early death Neg Hx   . Hearing loss Neg Hx   . Heart disease Neg Hx   . Hyperlipidemia Neg Hx   . Hypertension Neg Hx   . Kidney disease Neg Hx   . Learning disabilities Neg Hx   . Mental illness Neg Hx   . Mental retardation Neg Hx   . Miscarriages / Stillbirths Neg Hx   . Stroke Neg Hx   . Vision loss Neg Hx     History  Substance Use Topics  . Smoking status: Never Smoker   . Smokeless tobacco: Not on file  . Alcohol Use: Yes    Allergies: No Known Allergies  Prescriptions prior to admission  Medication Sig Dispense Refill  . [DISCONTINUED] cephALEXin (KEFLEX) 500 MG capsule Take 1 capsule (500 mg total) by mouth 4 (four) times daily.  40 capsule  0  . [DISCONTINUED] ondansetron (ZOFRAN) 4 MG tablet Take 1 tablet (4 mg total) by mouth every 6  (six) hours.  12 tablet  0    Review of Systems  Constitutional: Negative for fever and chills.  Gastrointestinal: Positive for abdominal pain (intermittent). Negative for nausea, vomiting, diarrhea and constipation.  Genitourinary: Negative for dysuria, urgency and frequency.       Neg for vaginal discharge and bleeding  Neurological: Negative for headaches.   Physical Exam   Blood pressure 103/54, pulse 84, temperature 98.2 F (36.8 C), temperature source Oral, resp. rate 18, height 5\' 6"  (1.676 m), weight 167 lb (75.751 kg), last menstrual period 05/29/2013, SpO2 100.00%.  Physical Exam  Constitutional: She is oriented to person, place, and time. She appears well-developed and well-nourished. No distress.  HENT:  Head: Normocephalic.  Neck: Normal range of motion.  Cardiovascular: Normal rate.   Respiratory: Effort normal and breath sounds normal. No respiratory distress. She has no wheezes. She has no rales.  GI: Soft. She exhibits no distension and no mass. There is no tenderness. There is no rebound and no guarding.  Fundus palpated to umbilicus.  Genitourinary: There  is no rash, tenderness or lesion on the right labia. There is no rash, tenderness or lesion on the left labia. Uterus is enlarged (measures 20-21wks). No erythema, tenderness or bleeding around the vagina. Vaginal discharge (moderate amount of frothy malodorous discharge) found.  Neurological: She is alert and oriented to person, place, and time.  Skin: Skin is warm and dry.  Psychiatric: She has a normal mood and affect. Her behavior is normal.    MAU Course  Procedures Results for orders placed during the hospital encounter of 08/18/13 (from the past 24 hour(s))  WET PREP, GENITAL     Status: Abnormal   Collection Time    08/18/13  3:40 PM      Result Value Range   Yeast Wet Prep HPF POC NONE SEEN  NONE SEEN   Trich, Wet Prep NONE SEEN  NONE SEEN   Clue Cells Wet Prep HPF POC FEW (*) NONE SEEN   WBC, Wet  Prep HPF POC MODERATE (*) NONE SEEN   MDM Care turned over to S.Chase Picket, NP  Assessment and Plan  SOB [redacted]w[redacted]d pregnant BV- flagyl 500mg  BID for 7 days F/u for OB care at [facility] clinic- message sent to clinic to call pt for appointment  KEY,EVE M 08/18/2013, 3:19 PM

## 2013-08-18 NOTE — MAU Note (Signed)
C/o SOB since last night around 2230;

## 2013-08-19 LAB — CULTURE, OB URINE: Special Requests: NORMAL

## 2013-08-21 LAB — GC/CHLAMYDIA PROBE AMP: GC Probe RNA: NEGATIVE

## 2013-08-21 NOTE — MAU Provider Note (Signed)
Attestation of Attending Supervision of Advanced Practitioner (CNM/NP): Evaluation and management procedures were performed by the Advanced Practitioner under my supervision and collaboration.  I have reviewed the Advanced Practitioner's note and chart, and I agree with the management and plan.  Deanna Murray 08/21/2013 2:33 PM

## 2013-08-22 ENCOUNTER — Encounter (HOSPITAL_COMMUNITY): Payer: Self-pay | Admitting: *Deleted

## 2013-08-23 ENCOUNTER — Encounter: Payer: Self-pay | Admitting: Obstetrics and Gynecology

## 2013-08-23 ENCOUNTER — Encounter: Payer: Medicaid Other | Admitting: Obstetrics and Gynecology

## 2013-08-24 ENCOUNTER — Encounter: Payer: Self-pay | Admitting: *Deleted

## 2013-08-28 ENCOUNTER — Encounter: Payer: Self-pay | Admitting: *Deleted

## 2013-09-13 ENCOUNTER — Encounter: Payer: Self-pay | Admitting: Obstetrics and Gynecology

## 2013-09-13 ENCOUNTER — Encounter: Payer: Medicaid Other | Admitting: Obstetrics and Gynecology

## 2013-09-13 DIAGNOSIS — Z3480 Encounter for supervision of other normal pregnancy, unspecified trimester: Secondary | ICD-10-CM | POA: Insufficient documentation

## 2013-09-13 DIAGNOSIS — B9689 Other specified bacterial agents as the cause of diseases classified elsewhere: Secondary | ICD-10-CM | POA: Insufficient documentation

## 2013-09-13 NOTE — Patient Instructions (Signed)
Second Trimester of Pregnancy The second trimester is from week 13 through week 28, months 4 through 6. The second trimester is often a time when you feel your best. Your body has also adjusted to being pregnant, and you begin to feel better physically. Usually, morning sickness has lessened or quit completely, you may have more energy, and you may have an increase in appetite. The second trimester is also a time when the fetus is growing rapidly. At the end of the sixth month, the fetus is about 9 inches long and weighs about 1 pounds. You will likely begin to feel the baby move (quickening) between 18 and 20 weeks of the pregnancy. BODY CHANGES Your body goes through many changes during pregnancy. The changes vary from woman to woman.   Your weight will continue to increase. You will notice your lower abdomen bulging out.  You may begin to get stretch marks on your hips, abdomen, and breasts.  You may develop headaches that can be relieved by medicines approved by your caregiver.  You may urinate more often because the fetus is pressing on your bladder.  You may develop or continue to have heartburn as a result of your pregnancy.  You may develop constipation because certain hormones are causing the muscles that push waste through your intestines to slow down.  You may develop hemorrhoids or swollen, bulging veins (varicose veins).  You may have back pain because of the weight gain and pregnancy hormones relaxing your joints between the bones in your pelvis and as a result of a shift in weight and the muscles that support your balance.  Your breasts will continue to grow and be tender.  Your gums may bleed and may be sensitive to brushing and flossing.  Dark spots or blotches (chloasma, mask of pregnancy) may develop on your face. This will likely fade after the baby is born.  A dark line from your belly button to the pubic area (linea nigra) may appear. This will likely fade after the  baby is born. WHAT TO EXPECT AT YOUR PRENATAL VISITS During a routine prenatal visit:  You will be weighed to make sure you and the fetus are growing normally.  Your blood pressure will be taken.  Your abdomen will be measured to track your baby's growth.  The fetal heartbeat will be listened to.  Any test results from the previous visit will be discussed. Your caregiver may ask you:  How you are feeling.  If you are feeling the baby move.  If you have had any abnormal symptoms, such as leaking fluid, bleeding, severe headaches, or abdominal cramping.  If you have any questions. Other tests that may be performed during your second trimester include:  Blood tests that check for:  Low iron levels (anemia).  Gestational diabetes (between 24 and 28 weeks).  Rh antibodies.  Urine tests to check for infections, diabetes, or protein in the urine.  An ultrasound to confirm the proper growth and development of the baby.  An amniocentesis to check for possible genetic problems.  Fetal screens for spina bifida and Down syndrome. HOME CARE INSTRUCTIONS   Avoid all smoking, herbs, alcohol, and unprescribed drugs. These chemicals affect the formation and growth of the baby.  Follow your caregiver's instructions regarding medicine use. There are medicines that are either safe or unsafe to take during pregnancy.  Exercise only as directed by your caregiver. Experiencing uterine cramps is a good sign to stop exercising.  Continue to eat regular,   healthy meals.  Wear a good support bra for breast tenderness.  Do not use hot tubs, steam rooms, or saunas.  Wear your seat belt at all times when driving.  Avoid raw meat, uncooked cheese, cat litter boxes, and soil used by cats. These carry germs that can cause birth defects in the baby.  Take your prenatal vitamins.  Try taking a stool softener (if your caregiver approves) if you develop constipation. Eat more high-fiber foods,  such as fresh vegetables or fruit and whole grains. Drink plenty of fluids to keep your urine clear or pale yellow.  Take warm sitz baths to soothe any pain or discomfort caused by hemorrhoids. Use hemorrhoid cream if your caregiver approves.  If you develop varicose veins, wear support hose. Elevate your feet for 15 minutes, 3 4 times a day. Limit salt in your diet.  Avoid heavy lifting, wear low heel shoes, and practice good posture.  Rest with your legs elevated if you have leg cramps or low back pain.  Visit your dentist if you have not gone yet during your pregnancy. Use a soft toothbrush to brush your teeth and be gentle when you floss.  A sexual relationship may be continued unless your caregiver directs you otherwise.  Continue to go to all your prenatal visits as directed by your caregiver. SEEK MEDICAL CARE IF:   You have dizziness.  You have mild pelvic cramps, pelvic pressure, or nagging pain in the abdominal area.  You have persistent nausea, vomiting, or diarrhea.  You have a bad smelling vaginal discharge.  You have pain with urination. SEEK IMMEDIATE MEDICAL CARE IF:   You have a fever.  You are leaking fluid from your vagina.  You have spotting or bleeding from your vagina.  You have severe abdominal cramping or pain.  You have rapid weight gain or loss.  You have shortness of breath with chest pain.  You notice sudden or extreme swelling of your face, hands, ankles, feet, or legs.  You have not felt your baby move in over an hour.  You have severe headaches that do not go away with medicine.  You have vision changes. Document Released: 10/13/2001 Document Revised: 06/21/2013 Document Reviewed: 12/20/2012 [employer] Patient Information 2014 [employer].  

## 2013-09-15 ENCOUNTER — Encounter (HOSPITAL_COMMUNITY): Payer: Self-pay | Admitting: General Practice

## 2013-09-15 ENCOUNTER — Inpatient Hospital Stay (HOSPITAL_COMMUNITY)
Admission: AD | Admit: 2013-09-15 | Discharge: 2013-09-15 | Disposition: A | Discharge status: Home / Self Care | Payer: Medicaid Other | Source: Ambulatory Visit | Attending: Obstetrics & Gynecology | Admitting: Obstetrics & Gynecology

## 2013-09-15 DIAGNOSIS — N949 Unspecified condition associated with female genital organs and menstrual cycle: Secondary | ICD-10-CM | POA: Insufficient documentation

## 2013-09-15 DIAGNOSIS — O98319 Other infections with a predominantly sexual mode of transmission complicating pregnancy, unspecified trimester: Secondary | ICD-10-CM | POA: Insufficient documentation

## 2013-09-15 DIAGNOSIS — A749 Chlamydial infection, unspecified: Secondary | ICD-10-CM

## 2013-09-15 DIAGNOSIS — O0930 Supervision of pregnancy with insufficient antenatal care, unspecified trimester: Secondary | ICD-10-CM | POA: Insufficient documentation

## 2013-09-15 DIAGNOSIS — A5619 Other chlamydial genitourinary infection: Secondary | ICD-10-CM | POA: Insufficient documentation

## 2013-09-15 DIAGNOSIS — R109 Unspecified abdominal pain: Secondary | ICD-10-CM | POA: Insufficient documentation

## 2013-09-15 DIAGNOSIS — N739 Female pelvic inflammatory disease, unspecified: Secondary | ICD-10-CM | POA: Insufficient documentation

## 2013-09-15 LAB — URINALYSIS, ROUTINE W REFLEX MICROSCOPIC
Glucose, UA: NEGATIVE mg/dL
Hgb urine dipstick: NEGATIVE
Ketones, ur: 40 mg/dL — AB
Leukocytes, UA: NEGATIVE
Protein, ur: NEGATIVE mg/dL

## 2013-09-15 MED ORDER — ONDANSETRON 4 MG PO TBDP: 8.0000 mg | ORAL_TABLET | Freq: Three times a day (TID) | ORAL | Status: DC | PRN

## 2013-09-15 MED ORDER — AZITHROMYCIN 250 MG PO TABS
1000.0000 mg | ORAL_TABLET | Freq: Once | ORAL | Status: AC
  Administered 2013-09-15: 1000 mg via ORAL
  Filled 2013-09-15: qty 4

## 2013-09-15 MED ORDER — ONDANSETRON 8 MG PO TBDP
8.0000 mg | ORAL_TABLET | Freq: Once | ORAL | Status: AC
  Administered 2013-09-15: 8 mg via ORAL
  Filled 2013-09-15: qty 1

## 2013-09-15 NOTE — MAU Note (Signed)
Pt arrived via EMS to MAU with c/o abdominal pain that has been going on for the past 2 days but today has become worse.  Pt states some red spotting 2 days ago but did not think anything of it. Pt denies sexual intercourse since she became pregnant. Pt denies drug use.

## 2013-09-15 NOTE — MAU Provider Note (Signed)
History     CSN: [id number]  Arrival date and time: 09/15/13 1749   First Provider Initiated Contact with Patient 09/15/13 1907      Chief Complaint  Patient presents with  . Abdominal Pain   HPI This is a 22 y.o. female at [redacted]w[redacted]d who presents via EMS with c/o abdominal pain for 2 days which worsened today. Also had spotting a few days ago. Denies fever but has had N/V today   Has missed her first two New OB appointments. No explanation. States did not know her chlamydia was positive.  Certified letter was sent since patient's phone mailbox was full. Has not been treated. States broke up with [facility] long time ago.    RN Note: Pt arrived via EMS to MAU with c/o abdominal pain that has been going on for the past 2 days but today has become worse. Pt states some red spotting 2 days ago but did not think anything of it. Pt denies sexual intercourse since she became pregnant. Pt denies drug use.      OB History   Grav Para Term Preterm Abortions TAB SAB Ect Mult Living   3 2 2       2       Past Medical History  Diagnosis Date  . Seizure     Past Surgical History  Procedure Laterality Date  . No past surgeries      Family History  Problem Relation Age of Onset  . Alcohol abuse Neg Hx   . Arthritis Neg Hx   . Asthma Neg Hx   . Birth defects Neg Hx   . Cancer Neg Hx   . COPD Neg Hx   . Depression Neg Hx   . Diabetes Neg Hx   . Drug abuse Neg Hx   . Early death Neg Hx   . Hearing loss Neg Hx   . Heart disease Neg Hx   . Hyperlipidemia Neg Hx   . Hypertension Neg Hx   . Kidney disease Neg Hx   . Learning disabilities Neg Hx   . Mental illness Neg Hx   . Mental retardation Neg Hx   . Miscarriages / Stillbirths Neg Hx   . Stroke Neg Hx   . Vision loss Neg Hx     History  Substance Use Topics  . Smoking status: Never Smoker   . Smokeless tobacco: Not on file  . Alcohol Use: Yes    Allergies: No Known Allergies  Prescriptions prior to admission  Medication Sig  Dispense Refill  . Prenatal Vit-Fe Fumarate-FA (PRENATAL MULTIVITAMIN) TABS tablet Take 1 tablet by mouth daily at 12 noon.        Review of Systems  Constitutional: Negative for fever, chills and malaise/fatigue.  Gastrointestinal: Positive for nausea, vomiting and abdominal pain. Negative for diarrhea and constipation.  Genitourinary: Negative for dysuria.  Neurological: Negative for dizziness.   Physical Exam   Blood pressure 107/60, pulse 113, temperature 98.7 F (37.1 C), temperature source Oral, resp. rate 18, height 5\' 4"  (1.626 m), weight 68.493 kg (151 lb), last menstrual period 05/29/2013.  Physical Exam  Constitutional: She is oriented to person, place, and time. She appears well-developed and well-nourished. No distress (talking on cellphone during interview).  Cardiovascular: Normal rate.   Respiratory: Effort normal.  GI: Soft. She exhibits no distension and no mass. There is tenderness (suprapubic). There is no rebound and no guarding.  Genitourinary: Vagina normal and uterus normal. No vaginal discharge found.  Cervix  closed and long Pelvic exam tender  Musculoskeletal: Normal range of motion.  Neurological: She is alert and oriented to person, place, and time.  Skin: Skin is warm and dry.  Psychiatric: She has a normal mood and affect.    MAU Course  Procedures  MDM Cultures deferred, since already done.   Assessment and Plan  A:  SIUP at [redacted]w[redacted]d       No prenatal care      Missed 2 new ob appts      Untreated chlamydia      Pelvic pain  P:  Zithromax       Zofran       Followup with next New OB appt  [facility] 09/15/2013, 7:26 PM   Pt still with abd pain but declines additional meds/eval- ready to go home. D/C home Rx Zofran F/U on 12/4 at clinic as scheduled- will need TOC at that visit. Clelia Croft, KIMBERLY 9:10 PM 09/15/2013

## 2013-09-18 NOTE — Progress Notes (Signed)
Error - no show

## 2013-10-05 ENCOUNTER — Ambulatory Visit (INDEPENDENT_AMBULATORY_CARE_PROVIDER_SITE_OTHER): Payer: Medicaid Other | Admitting: Advanced Practice Midwife

## 2013-10-05 ENCOUNTER — Other Ambulatory Visit (HOSPITAL_COMMUNITY)
Admission: RE | Admit: 2013-10-05 | Discharge: 2013-10-05 | Disposition: A | Discharge status: Home / Self Care | Payer: Medicaid Other | Source: Ambulatory Visit | Attending: Advanced Practice Midwife | Admitting: Advanced Practice Midwife

## 2013-10-05 ENCOUNTER — Encounter: Payer: Self-pay | Admitting: Advanced Practice Midwife

## 2013-10-05 VITALS — BP 101/62 | Temp 97.9°F | Wt 154.5 lb

## 2013-10-05 DIAGNOSIS — Z113 Encounter for screening for infections with a predominantly sexual mode of transmission: Secondary | ICD-10-CM | POA: Insufficient documentation

## 2013-10-05 DIAGNOSIS — Z1151 Encounter for screening for human papillomavirus (HPV): Secondary | ICD-10-CM | POA: Insufficient documentation

## 2013-10-05 DIAGNOSIS — Z3480 Encounter for supervision of other normal pregnancy, unspecified trimester: Secondary | ICD-10-CM

## 2013-10-05 DIAGNOSIS — Z124 Encounter for screening for malignant neoplasm of cervix: Secondary | ICD-10-CM | POA: Insufficient documentation

## 2013-10-05 DIAGNOSIS — O0930 Supervision of pregnancy with insufficient antenatal care, unspecified trimester: Secondary | ICD-10-CM

## 2013-10-05 DIAGNOSIS — A749 Chlamydial infection, unspecified: Secondary | ICD-10-CM

## 2013-10-05 DIAGNOSIS — O0932 Supervision of pregnancy with insufficient antenatal care, second trimester: Secondary | ICD-10-CM

## 2013-10-05 DIAGNOSIS — O98319 Other infections with a predominantly sexual mode of transmission complicating pregnancy, unspecified trimester: Secondary | ICD-10-CM

## 2013-10-05 DIAGNOSIS — Z23 Encounter for immunization: Secondary | ICD-10-CM

## 2013-10-05 DIAGNOSIS — Z3482 Encounter for supervision of other normal pregnancy, second trimester: Secondary | ICD-10-CM

## 2013-10-05 DIAGNOSIS — R87810 Cervical high risk human papillomavirus (HPV) DNA test positive: Secondary | ICD-10-CM | POA: Insufficient documentation

## 2013-10-05 DIAGNOSIS — O0933 Supervision of pregnancy with insufficient antenatal care, third trimester: Secondary | ICD-10-CM | POA: Insufficient documentation

## 2013-10-05 DIAGNOSIS — R87619 Unspecified abnormal cytological findings in specimens from cervix uteri: Secondary | ICD-10-CM | POA: Insufficient documentation

## 2013-10-05 LAB — POCT URINALYSIS DIP (DEVICE)
Hgb urine dipstick: NEGATIVE
Protein, ur: NEGATIVE mg/dL
Specific Gravity, Urine: 1.025 (ref 1.005–1.030)
Urobilinogen, UA: 2 mg/dL — ABNORMAL HIGH (ref 0.0–1.0)
pH: 6.5 (ref 5.0–8.0)

## 2013-10-05 NOTE — Progress Notes (Signed)
P= 100 Pt. C/o of intermittent lower abdominal/pelvic pressure.  Edema in ankles.  Discussed appropriate weight gain based on BMI (15-25lb); pt. Verbalized understanding.  Flu vaccine today/

## 2013-10-05 NOTE — Progress Notes (Signed)
New OB.  See other note  Subjective:    Deanna Murray is a [id number] [redacted]w[redacted]d being seen today for her first obstetrical visit.  Her obstetrical history is significant for Late to care. Patient does not intend to breast feed. Pregnancy history fully reviewed.  Patient reports no complaints.  Filed Vitals:   10/05/13 1328  BP: 101/62  Temp: 97.9 F (36.6 C)  Weight: 154 lb 8 oz (70.081 kg)    HISTORY: OB History  Gravida Para Term Preterm AB SAB TAB Ectopic Multiple Living  3 2 2       2     # Outcome Date GA Lbr Len/2nd Weight Sex Delivery Anes PTL Lv  3 CUR           2 TRM 08/28/08 [redacted]w[redacted]d  7 lb 10 oz (3.459 kg) F SVD EPI  Y  1 TRM 06/29/06 [redacted]w[redacted]d  7 lb 15 oz (3.6 kg) M SVD None  Y     Past Medical History  Diagnosis Date  . Seizure    Past Surgical History  Procedure Laterality Date  . No past surgeries     Family History  Problem Relation Age of Onset  . Alcohol abuse Neg Hx   . Arthritis Neg Hx   . Asthma Neg Hx   . Birth defects Neg Hx   . Cancer Neg Hx   . COPD Neg Hx   . Depression Neg Hx   . Diabetes Neg Hx   . Drug abuse Neg Hx   . Early death Neg Hx   . Hearing loss Neg Hx   . Heart disease Neg Hx   . Hyperlipidemia Neg Hx   . Hypertension Neg Hx   . Kidney disease Neg Hx   . Learning disabilities Neg Hx   . Mental illness Neg Hx   . Mental retardation Neg Hx   . Miscarriages / Stillbirths Neg Hx   . Stroke Neg Hx   . Vision loss Neg Hx      Exam    Uterus:  Fundal Height: 24 cm  Pelvic Exam:    Perineum: No Hemorrhoids   Vulva: Bartholin's, Urethra, Skene's normal   Vagina:  normal mucosa, normal discharge   pH:    Cervix: multiparous appearance   Adnexa: normal adnexa and no mass, fullness, tenderness   Bony Pelvis: gynecoid  System: Breast:  normal appearance, no masses or tenderness   Skin: normal coloration and turgor, no rashes    Neurologic: oriented, grossly non-focal, normal mood   Extremities: normal strength, tone, and muscle  mass   HEENT neck supple with midline trachea   Mouth/Teeth mucous membranes moist, pharynx normal without lesions   Neck supple   Cardiovascular: regular rate and rhythm   Respiratory:  appears well, vitals normal, no respiratory distress, acyanotic, normal RR, ear and throat exam is normal, neck free of mass or lymphadenopathy, chest clear, no wheezing, crepitations, rhonchi, normal symmetric air entry   Abdomen: soft, non-tender; bowel sounds normal; no masses,  no organomegaly   Urinary: urethral meatus normal      Assessment:    Pregnancy: [id number] Patient Active Problem List   Diagnosis Date Noted  . Late prenatal care complicating pregnancy in second trimester 10/05/2013  . Supervision of normal subsequent pregnancy 09/13/2013  . BV (bacterial vaginosis) 09/13/2013        Plan:     Initial labs drawn. Prenatal vitamins. Problem list reviewed and updated. Genetic Screening discussed Quad Screen:  Too late.  Ultrasound discussed; fetal survey: results reviewed.  Follow up in 4 weeks. 50% of 30 min visit spent on counseling and coordination of care.   Glucola next visit   [facility] 10/05/2013

## 2013-10-05 NOTE — Patient Instructions (Signed)
Second Trimester of Pregnancy The second trimester is from week 13 through week 28, months 4 through 6. The second trimester is often a time when you feel your best. Your body has also adjusted to being pregnant, and you begin to feel better physically. Usually, morning sickness has lessened or quit completely, you may have more energy, and you may have an increase in appetite. The second trimester is also a time when the fetus is growing rapidly. At the end of the sixth month, the fetus is about 9 inches long and weighs about 1 pounds. You will likely begin to feel the baby move (quickening) between 18 and 20 weeks of the pregnancy. BODY CHANGES Your body goes through many changes during pregnancy. The changes vary from woman to woman.   Your weight will continue to increase. You will notice your lower abdomen bulging out.  You may begin to get stretch marks on your hips, abdomen, and breasts.  You may develop headaches that can be relieved by medicines approved by your caregiver.  You may urinate more often because the fetus is pressing on your bladder.  You may develop or continue to have heartburn as a result of your pregnancy.  You may develop constipation because certain hormones are causing the muscles that push waste through your intestines to slow down.  You may develop hemorrhoids or swollen, bulging veins (varicose veins).  You may have back pain because of the weight gain and pregnancy hormones relaxing your joints between the bones in your pelvis and as a result of a shift in weight and the muscles that support your balance.  Your breasts will continue to grow and be tender.  Your gums may bleed and may be sensitive to brushing and flossing.  Dark spots or blotches (chloasma, mask of pregnancy) may develop on your face. This will likely fade after the baby is born.  A dark line from your belly button to the pubic area (linea nigra) may appear. This will likely fade after the  baby is born. WHAT TO EXPECT AT YOUR PRENATAL VISITS During a routine prenatal visit:  You will be weighed to make sure you and the fetus are growing normally.  Your blood pressure will be taken.  Your abdomen will be measured to track your baby's growth.  The fetal heartbeat will be listened to.  Any test results from the previous visit will be discussed. Your caregiver may ask you:  How you are feeling.  If you are feeling the baby move.  If you have had any abnormal symptoms, such as leaking fluid, bleeding, severe headaches, or abdominal cramping.  If you have any questions. Other tests that may be performed during your second trimester include:  Blood tests that check for:  Low iron levels (anemia).  Gestational diabetes (between 24 and 28 weeks).  Rh antibodies.  Urine tests to check for infections, diabetes, or protein in the urine.  An ultrasound to confirm the proper growth and development of the baby.  An amniocentesis to check for possible genetic problems.  Fetal screens for spina bifida and Down syndrome. HOME CARE INSTRUCTIONS   Avoid all smoking, herbs, alcohol, and unprescribed drugs. These chemicals affect the formation and growth of the baby.  Follow your caregiver's instructions regarding medicine use. There are medicines that are either safe or unsafe to take during pregnancy.  Exercise only as directed by your caregiver. Experiencing uterine cramps is a good sign to stop exercising.  Continue to eat regular,   healthy meals.  Wear a good support bra for breast tenderness.  Do not use hot tubs, steam rooms, or saunas.  Wear your seat belt at all times when driving.  Avoid raw meat, uncooked cheese, cat litter boxes, and soil used by cats. These carry germs that can cause birth defects in the baby.  Take your prenatal vitamins.  Try taking a stool softener (if your caregiver approves) if you develop constipation. Eat more high-fiber foods,  such as fresh vegetables or fruit and whole grains. Drink plenty of fluids to keep your urine clear or pale yellow.  Take warm sitz baths to soothe any pain or discomfort caused by hemorrhoids. Use hemorrhoid cream if your caregiver approves.  If you develop varicose veins, wear support hose. Elevate your feet for 15 minutes, 3 4 times a day. Limit salt in your diet.  Avoid heavy lifting, wear low heel shoes, and practice good posture.  Rest with your legs elevated if you have leg cramps or low back pain.  Visit your dentist if you have not gone yet during your pregnancy. Use a soft toothbrush to brush your teeth and be gentle when you floss.  A sexual relationship may be continued unless your caregiver directs you otherwise.  Continue to go to all your prenatal visits as directed by your caregiver. SEEK MEDICAL CARE IF:   You have dizziness.  You have mild pelvic cramps, pelvic pressure, or nagging pain in the abdominal area.  You have persistent nausea, vomiting, or diarrhea.  You have a bad smelling vaginal discharge.  You have pain with urination. SEEK IMMEDIATE MEDICAL CARE IF:   You have a fever.  You are leaking fluid from your vagina.  You have spotting or bleeding from your vagina.  You have severe abdominal cramping or pain.  You have rapid weight gain or loss.  You have shortness of breath with chest pain.  You notice sudden or extreme swelling of your face, hands, ankles, feet, or legs.  You have not felt your baby move in over an hour.  You have severe headaches that do not go away with medicine.  You have vision changes. Document Released: 10/13/2001 Document Revised: 06/21/2013 Document Reviewed: 12/20/2012 [employer] Patient Information 2014 [employer].  

## 2013-10-06 LAB — OBSTETRIC PANEL
Antibody Screen: NEGATIVE
HCT: 31.9 % — ABNORMAL LOW (ref 36.0–46.0)
Hemoglobin: 10.4 g/dL — ABNORMAL LOW (ref 12.0–15.0)
Lymphocytes Relative: 19 % (ref 12–46)
Lymphs Abs: 1.4 10*3/uL (ref 0.7–4.0)
Monocytes Absolute: 0.4 10*3/uL (ref 0.1–1.0)
Monocytes Relative: 6 % (ref 3–12)
Neutro Abs: 5.6 10*3/uL (ref 1.7–7.7)
RBC: 3.86 MIL/uL — ABNORMAL LOW (ref 3.87–5.11)
Rh Type: POSITIVE
Rubella: 1.87 Index — ABNORMAL HIGH (ref <0.90)
WBC: 7.5 10*3/uL (ref 4.0–10.5)

## 2013-10-06 LAB — HIV ANTIBODY (ROUTINE TESTING W REFLEX): HIV: NONREACTIVE

## 2013-10-09 LAB — HEMOGLOBINOPATHY EVALUATION: Hemoglobin Other: 0 %

## 2013-10-12 ENCOUNTER — Encounter: Payer: Self-pay | Admitting: Advanced Practice Midwife

## 2013-10-12 ENCOUNTER — Other Ambulatory Visit: Payer: Self-pay | Admitting: Advanced Practice Midwife

## 2013-10-12 DIAGNOSIS — R87619 Unspecified abnormal cytological findings in specimens from cervix uteri: Secondary | ICD-10-CM | POA: Insufficient documentation

## 2013-10-12 DIAGNOSIS — O98212 Gonorrhea complicating pregnancy, second trimester: Secondary | ICD-10-CM | POA: Insufficient documentation

## 2013-10-12 MED ORDER — CEFTRIAXONE SODIUM 1 G IJ SOLR: 250.0000 mg | Freq: Once | INTRAMUSCULAR | Status: DC

## 2013-10-16 ENCOUNTER — Telehealth: Payer: Self-pay

## 2013-10-16 NOTE — Telephone Encounter (Signed)
Message copied by Louanna Raw on Mon Oct 16, 2013  1:01 PM ------      Message from: [place], [place] L      Created: Thu Oct 12, 2013  8:55 PM      Regarding: Needs GC treatment and Colpo       Has ASCUS pap, Cannot exclude high grade lesion, + HR HPV            Also has GC.            I put in a Rocephin order.             Chlamydia negative            Thanks       Hilda Lias ------

## 2013-10-16 NOTE — Telephone Encounter (Signed)
Called pt. And informed her of gonorrhea and the need to come in and get treated. Pt. Stated she could come in tomorrow morning at 930. Also informed patient that her pap was abnormal and therefore she will need a colposcopy. Pt. Verbalized understanding. Informed pt. I would pass her information to the front and she would be called with an appointment for that. Pt. Verbalized understanding and had no further questions or concerns.

## 2013-10-17 ENCOUNTER — Telehealth: Payer: Self-pay

## 2013-10-17 ENCOUNTER — Ambulatory Visit: Payer: Medicaid Other

## 2013-10-17 NOTE — Telephone Encounter (Signed)
Opened in error

## 2013-10-17 NOTE — Telephone Encounter (Signed)
Called pt. To see why she had not come to her appointment for treatment. Pt. Stated she really forgot. Pt. States she can come in tomorrow morning at 10am. Informed pt. I will put her on the schedule and we will see her tomorrow at 10. Reiterated it is important for both her and her partner to be treated Pt. Verbalized understanding and gratitude.

## 2013-10-18 ENCOUNTER — Ambulatory Visit: Payer: Medicaid Other

## 2013-11-01 ENCOUNTER — Ambulatory Visit (INDEPENDENT_AMBULATORY_CARE_PROVIDER_SITE_OTHER): Payer: Medicaid Other | Admitting: Family Medicine

## 2013-11-01 ENCOUNTER — Encounter: Payer: Self-pay | Admitting: Family Medicine

## 2013-11-01 VITALS — BP 127/72 | Temp 97.0°F | Wt 159.5 lb

## 2013-11-01 DIAGNOSIS — O0930 Supervision of pregnancy with insufficient antenatal care, unspecified trimester: Secondary | ICD-10-CM

## 2013-11-01 DIAGNOSIS — O0932 Supervision of pregnancy with insufficient antenatal care, second trimester: Secondary | ICD-10-CM

## 2013-11-01 DIAGNOSIS — O98212 Gonorrhea complicating pregnancy, second trimester: Secondary | ICD-10-CM

## 2013-11-01 DIAGNOSIS — O98219 Gonorrhea complicating pregnancy, unspecified trimester: Secondary | ICD-10-CM

## 2013-11-01 DIAGNOSIS — IMO0002 Reserved for concepts with insufficient information to code with codable children: Secondary | ICD-10-CM

## 2013-11-01 DIAGNOSIS — Z3483 Encounter for supervision of other normal pregnancy, third trimester: Secondary | ICD-10-CM

## 2013-11-01 DIAGNOSIS — Z23 Encounter for immunization: Secondary | ICD-10-CM

## 2013-11-01 LAB — HIV ANTIBODY (ROUTINE TESTING W REFLEX): HIV: NONREACTIVE

## 2013-11-01 LAB — GLUCOSE TOLERANCE, 1 HOUR (50G) W/O FASTING: Glucose, 1 Hour GTT: 142 mg/dL — ABNORMAL HIGH (ref 70–140)

## 2013-11-01 LAB — POCT URINALYSIS DIP (DEVICE)
Ketones, ur: NEGATIVE mg/dL
Protein, ur: 30 mg/dL — AB
Urobilinogen, UA: 1 mg/dL (ref 0.0–1.0)
pH: 6.5 (ref 5.0–8.0)

## 2013-11-01 LAB — CBC
Hemoglobin: 9.8 g/dL — ABNORMAL LOW (ref 12.0–15.0)
MCH: 27.2 pg (ref 26.0–34.0)
MCHC: 32.3 g/dL (ref 30.0–36.0)
MCV: 84.2 fL (ref 78.0–100.0)
RDW: 13.9 % (ref 11.5–15.5)

## 2013-11-01 LAB — RPR

## 2013-11-01 MED ORDER — TETANUS-DIPHTH-ACELL PERTUSSIS 5-2.5-18.5 LF-MCG/0.5 IM SUSP: 0.5000 mL | Freq: Once | INTRAMUSCULAR | Status: DC

## 2013-11-01 NOTE — Progress Notes (Signed)
Pulse- 94 

## 2013-11-01 NOTE — Addendum Note (Signed)
Addended by: Gerome Apley on: 11/01/2013 11:29 AM   Modules accepted: Orders

## 2013-11-01 NOTE — Progress Notes (Signed)
+  FM, no lof, no vb no ctx TOC for ghonorrhea today  Colpo - delay until postpartum - discussed risks with patient and agrees with plan   Deanna Murray is a 22 y.o. [id number] at [redacted]w[redacted]d here for ROB visit.  Discussed with Patient:  -Plans to breast/bottle feed.  All questions answered. -Continue prenatal vitamins. -Reviewed fetal kick counts (Pt to perform daily at a time when the baby is active, lie laterally with both hands on belly in quiet room and count all movements (hiccups, shoulder rolls, obvious kicks, etc); pt is to report to clinic or MAU for less than 10 movements felt in a one hour time period-pt told as soon as she counts 10 movements the count is complete.)  - Routine precautions discussed (depression, infection s/s).   Patient provided with all pertinent phone numbers for emergencies. - RTC for any VB, regular, painful cramps/ctxs occurring at a rate of >2/10 min, fever (100.5 or higher), n/v/d, any pain that is unresolving or worsening, LOF, decreased fetal movement, CP, SOB, edema  Problems: Patient Active Problem List   Diagnosis Date Noted  . Abnormal Pap smear 10/12/2013  . Gonorrhea complicating pregnancy in second trimester 10/12/2013  . Late prenatal care complicating pregnancy in second trimester 10/05/2013  . Supervision of normal subsequent pregnancy 09/13/2013  . BV (bacterial vaginosis) 09/13/2013    To Do: 1. glucola today 2. CBC and antibody screen ordered. 3. TOC today 4.  [ ]  Vaccines: Flu: rec'd  Tdap: 11/01/13 [ ]  BCM: mirena  Edu: [x ] PTL precautions; [ ]  BF class; [ ]  childbirth class; [ ]   BF counseling;

## 2013-11-01 NOTE — Patient Instructions (Signed)
Third Trimester of Pregnancy  The third trimester is from week 29 through week 42, months 7 through 9. The third trimester is a time when the fetus is growing rapidly. At the end of the ninth month, the fetus is about 20 inches in length and weighs 6 10 pounds.   BODY CHANGES  Your body goes through many changes during pregnancy. The changes vary from woman to woman.    Your weight will continue to increase. You can expect to gain 25 35 pounds (11 16 kg) by the end of the pregnancy.   You may begin to get stretch marks on your hips, abdomen, and breasts.   You may urinate more often because the fetus is moving lower into your pelvis and pressing on your bladder.   You may develop or continue to have heartburn as a result of your pregnancy.   You may develop constipation because certain hormones are causing the muscles that push waste through your intestines to slow down.   You may develop hemorrhoids or swollen, bulging veins (varicose veins).   You may have pelvic pain because of the weight gain and pregnancy hormones relaxing your joints between the bones in your pelvis. Back aches may result from over exertion of the muscles supporting your posture.   Your breasts will continue to grow and be tender. A yellow discharge may leak from your breasts called colostrum.   Your belly button may stick out.   You may feel short of breath because of your expanding uterus.   You may notice the fetus "dropping," or moving lower in your abdomen.   You may have a bloody mucus discharge. This usually occurs a few days to a week before labor begins.   Your cervix becomes thin and soft (effaced) near your due date.  WHAT TO EXPECT AT YOUR PRENATAL EXAMS   You will have prenatal exams every 2 weeks until week 36. Then, you will have weekly prenatal exams. During a routine prenatal visit:   You will be weighed to make sure you and the fetus are growing normally.   Your blood pressure is taken.   Your abdomen will be  measured to track your baby's growth.   The fetal heartbeat will be listened to.   Any test results from the previous visit will be discussed.   You may have a cervical check near your due date to see if you have effaced.  At around 36 weeks, your caregiver will check your cervix. At the same time, your caregiver will also perform a test on the secretions of the vaginal tissue. This test is to determine if a type of bacteria, Group B streptococcus, is present. Your caregiver will explain this further.  Your caregiver may ask you:   What your birth plan is.   How you are feeling.   If you are feeling the baby move.   If you have had any abnormal symptoms, such as leaking fluid, bleeding, severe headaches, or abdominal cramping.   If you have any questions.  Other tests or screenings that may be performed during your third trimester include:   Blood tests that check for low iron levels (anemia).   Fetal testing to check the health, activity level, and growth of the fetus. Testing is done if you have certain medical conditions or if there are problems during the pregnancy.  FALSE LABOR  You may feel small, irregular contractions that eventually go away. These are called Braxton Hicks contractions, or   false labor. Contractions may last for hours, days, or even weeks before true labor sets in. If contractions come at regular intervals, intensify, or become painful, it is best to be seen by your caregiver.   SIGNS OF LABOR    Menstrual-like cramps.   Contractions that are 5 minutes apart or less.   Contractions that start on the top of the uterus and spread down to the lower abdomen and back.   A sense of increased pelvic pressure or back pain.   A watery or bloody mucus discharge that comes from the vagina.  If you have any of these signs before the 37th week of pregnancy, call your caregiver right away. You need to go to the hospital to get checked immediately.  HOME CARE INSTRUCTIONS    Avoid all  smoking, herbs, alcohol, and unprescribed drugs. These chemicals affect the formation and growth of the baby.   Follow your caregiver's instructions regarding medicine use. There are medicines that are either safe or unsafe to take during pregnancy.   Exercise only as directed by your caregiver. Experiencing uterine cramps is a good sign to stop exercising.   Continue to eat regular, healthy meals.   Wear a good support bra for breast tenderness.   Do not use hot tubs, steam rooms, or saunas.   Wear your seat belt at all times when driving.   Avoid raw meat, uncooked cheese, cat litter boxes, and soil used by cats. These carry germs that can cause birth defects in the baby.   Take your prenatal vitamins.   Try taking a stool softener (if your caregiver approves) if you develop constipation. Eat more high-fiber foods, such as fresh vegetables or fruit and whole grains. Drink plenty of fluids to keep your urine clear or pale yellow.   Take warm sitz baths to soothe any pain or discomfort caused by hemorrhoids. Use hemorrhoid cream if your caregiver approves.   If you develop varicose veins, wear support hose. Elevate your feet for 15 minutes, 3 4 times a day. Limit salt in your diet.   Avoid heavy lifting, wear low heal shoes, and practice good posture.   Rest a lot with your legs elevated if you have leg cramps or low back pain.   Visit your dentist if you have not gone during your pregnancy. Use a soft toothbrush to brush your teeth and be gentle when you floss.   A sexual relationship may be continued unless your caregiver directs you otherwise.   Do not travel far distances unless it is absolutely necessary and only with the approval of your caregiver.   Take prenatal classes to understand, practice, and ask questions about the labor and delivery.   Make a trial run to the hospital.   Pack your hospital bag.   Prepare the baby's nursery.   Continue to go to all your prenatal visits as directed  by your caregiver.  SEEK MEDICAL CARE IF:   You are unsure if you are in labor or if your water has broken.   You have dizziness.   You have mild pelvic cramps, pelvic pressure, or nagging pain in your abdominal area.   You have persistent nausea, vomiting, or diarrhea.   You have a bad smelling vaginal discharge.   You have pain with urination.  SEEK IMMEDIATE MEDICAL CARE IF:    You have a fever.   You are leaking fluid from your vagina.   You have spotting or bleeding from your vagina.     You have severe abdominal cramping or pain.   You have rapid weight loss or gain.   You have shortness of breath with chest pain.   You notice sudden or extreme swelling of your face, hands, ankles, feet, or legs.   You have not felt your baby move in over an hour.   You have severe headaches that do not go away with medicine.   You have vision changes.  Document Released: 10/13/2001 Document Revised: 06/21/2013 Document Reviewed: 12/20/2012  [employer] Patient Information 2014 [employer].

## 2013-11-02 LAB — GC/CHLAMYDIA PROBE AMP
CT Probe RNA: NEGATIVE
GC Probe RNA: POSITIVE — AB

## 2013-11-02 NOTE — L&D Delivery Note (Signed)
Delivery Note At 3:28 AM a viable female was delivered via Vaginal, Spontaneous Delivery (Presentation: ROA).  APGAR: 10,10 ; weight - pending.  Placenta status: Intact, Spontaneous.  Cord: 3 vessel cord with the following complications: None.  Cord pH: N/A.  Anesthesia: Epidural  Episiotomy: None Lacerations: None; Small skin tear (hemostatic).  Suture Repair: N/A Est. Blood Loss (mL): 250  Mom to postpartum.  Baby to Couplet care / Skin to Skin.  Everlene OtherCook, Jayce 02/01/2014, 3:42 AM  I was present for the exam and agree with above.  [place] Chayim Bialas, CNM 02/01/2014 6:03 AM

## 2013-11-06 ENCOUNTER — Telehealth: Payer: Self-pay

## 2013-11-06 NOTE — Telephone Encounter (Signed)
Message copied by Louanna RawAMPBELL, Jalyne Brodzinski M on Mon Nov 06, 2013 11:38 AM ------      Message from: Jolyn LentDOM, MICHAEL R      Created: Sun Nov 05, 2013  4:45 PM       Elevated 1hr, please schedule for 3hr ------

## 2013-11-06 NOTE — Telephone Encounter (Signed)
Called. Pt. And informed her of abnormal 1hr gtt. Explained to patient that she needs to come in for 3hr in which she is fasting. Pt. States she can come tomorrow 11/07/13 at 8am. Will give information to front desk.

## 2013-11-07 ENCOUNTER — Other Ambulatory Visit: Payer: Medicaid Other

## 2013-11-07 DIAGNOSIS — O99810 Abnormal glucose complicating pregnancy: Secondary | ICD-10-CM

## 2013-11-08 ENCOUNTER — Encounter: Payer: Self-pay | Admitting: *Deleted

## 2013-11-08 LAB — GLUCOSE TOLERANCE, 3 HOURS
Glucose Tolerance, 1 hour: 129 mg/dL (ref 70–189)
Glucose Tolerance, 2 hour: 118 mg/dL (ref 70–164)
Glucose Tolerance, Fasting: 81 mg/dL (ref 70–104)
Glucose, GTT - 3 Hour: 112 mg/dL (ref 70–144)

## 2013-11-15 ENCOUNTER — Emergency Department (HOSPITAL_COMMUNITY)
Admission: EM | Admit: 2013-11-15 | Discharge: 2013-11-15 | Disposition: A | Discharge status: Home / Self Care | Payer: Medicaid Other | Attending: Emergency Medicine | Admitting: Emergency Medicine

## 2013-11-15 ENCOUNTER — Encounter: Payer: Self-pay | Admitting: Family Medicine

## 2013-11-15 ENCOUNTER — Encounter (HOSPITAL_COMMUNITY): Payer: Self-pay | Admitting: Emergency Medicine

## 2013-11-15 DIAGNOSIS — IMO0002 Reserved for concepts with insufficient information to code with codable children: Secondary | ICD-10-CM | POA: Insufficient documentation

## 2013-11-15 DIAGNOSIS — L0291 Cutaneous abscess, unspecified: Secondary | ICD-10-CM

## 2013-11-15 DIAGNOSIS — Z8669 Personal history of other diseases of the nervous system and sense organs: Secondary | ICD-10-CM | POA: Insufficient documentation

## 2013-11-15 DIAGNOSIS — Z79899 Other long term (current) drug therapy: Secondary | ICD-10-CM | POA: Insufficient documentation

## 2013-11-15 DIAGNOSIS — O9989 Other specified diseases and conditions complicating pregnancy, childbirth and the puerperium: Secondary | ICD-10-CM | POA: Insufficient documentation

## 2013-11-15 DIAGNOSIS — L0390 Cellulitis, unspecified: Secondary | ICD-10-CM

## 2013-11-15 HISTORY — DX: Encounter for supervision of normal pregnancy, unspecified, unspecified trimester: Z34.90

## 2013-11-15 MED ORDER — CEPHALEXIN 500 MG PO CAPS: 500.0000 mg | ORAL_CAPSULE | Freq: Four times a day (QID) | ORAL | Status: DC

## 2013-11-15 NOTE — ED Provider Notes (Signed)
Medical screening examination/treatment/procedure(s) were performed by non-physician practitioner and as supervising physician I was immediately available for consultation/collaboration.  EKG Interpretation   None         Shon Batonourtney F Jadda Hunsucker, MD 11/15/13 2006

## 2013-11-15 NOTE — ED Provider Notes (Signed)
CSN: [id number]     Arrival date & time 11/15/13  1020 History  This chart was scribed for non-physician practitioner Junius FinnerErin O'Malley, PA-C working with Shon Batonourtney F Horton, MD by Danella Maiersaroline Early, ED Scribe. This patient was seen in room TR10C/TR10C and the patient's care was started at 10:47 AM.    Chief Complaint  Patient presents with  . Insect Bite   The history is provided by the patient. No language interpreter was used.   HPI Comments: Deanna Murray is a 23 y.o. female who is 7 months pregnant who presents to the Emergency Department complaining of abscess to her right forearm with associated redness, pain to touch, and swelling onset 2 days ago. She is unsure how she got the abscess and denies seeing an insect bite her. She denies drainage fromm the area, fevers, numbness or tingling in her fingers. She denies h/o abscesses.   Past Medical History  Diagnosis Date  . Seizure   . Pregnancy     7 months   Past Surgical History  Procedure Laterality Date  . No past surgeries     Family History  Problem Relation Age of Onset  . Alcohol abuse Neg Hx   . Arthritis Neg Hx   . Asthma Neg Hx   . Birth defects Neg Hx   . Cancer Neg Hx   . COPD Neg Hx   . Depression Neg Hx   . Diabetes Neg Hx   . Drug abuse Neg Hx   . Early death Neg Hx   . Hearing loss Neg Hx   . Heart disease Neg Hx   . Hyperlipidemia Neg Hx   . Hypertension Neg Hx   . Kidney disease Neg Hx   . Learning disabilities Neg Hx   . Mental illness Neg Hx   . Mental retardation Neg Hx   . Miscarriages / Stillbirths Neg Hx   . Stroke Neg Hx   . Vision loss Neg Hx    History  Substance Use Topics  . Smoking status: Never Smoker   . Smokeless tobacco: Never Used  . Alcohol Use: No   OB History   Grav Para Term Preterm Abortions TAB SAB Ect Mult Living   3 2 2       2      Review of Systems  Constitutional: Negative for fever.  Skin: Positive for wound.  Neurological: Negative for numbness.  All other  systems reviewed and are negative.    Allergies  Review of patient's allergies indicates no known allergies.  Home Medications   Current Outpatient Rx  Name  Route  Sig  Dispense  Refill  . Prenatal Vit-Fe Fumarate-FA (PRENATAL MULTIVITAMIN) TABS tablet   Oral   Take 1 tablet by mouth daily at 12 noon.         . cephALEXin (KEFLEX) 500 MG capsule   Oral   Take 1 capsule (500 mg total) by mouth 4 (four) times daily.   40 capsule   0    BP 107/63  Pulse 110  Temp(Src) 97.8 F (36.6 C) (Oral)  Resp 18  Ht 5\' 4"  (1.626 m)  Wt 161 lb (73.029 kg)  BMI 27.62 kg/m2  SpO2 99%  LMP 05/29/2013 Physical Exam  Nursing note and vitals reviewed. Constitutional: She appears well-developed and well-nourished. No distress.  HENT:  Head: Normocephalic and atraumatic.  Eyes: Conjunctivae are normal. No scleral icterus.  Neck: Normal range of motion.  Cardiovascular: Normal rate, regular rhythm and normal  heart sounds.   Pulmonary/Chest: Effort normal and breath sounds normal. No respiratory distress. She has no wheezes. She has no rales. She exhibits no tenderness.  Abdominal: Soft.  Musculoskeletal: Normal range of motion.  Neurological: She is alert.  Skin: Skin is warm and dry. She is not diaphoretic.  Right forearm -  3 cm circular area of erythema and induration. Centralized scab. No active drainage. Ttp. No red streaking. Radial pulse 2+. 5/5 grip strength.    ED Course  Procedures (including critical care time) Medications - No data to display  DIAGNOSTIC STUDIES: Oxygen Saturation is 99% on RA, normal by my interpretation.    COORDINATION OF CARE: 11:31 AM- Discussed treatment plan with pt which includes discharge home with antibiotics. Pt agrees to plan.    Labs Review Labs Reviewed - No data to display Imaging Review No results found.  EKG Interpretation   None       MDM   1. Cellulitis and abscess    Possible insect bite with cellulitis and abscess  of right forearm. No active drainage, although centralized scab over abscess.  Tender to palpation. No red streaking. Pt denies fever, n/v/d. Rx: keflex, advised to use warm compresses 3-4x day, acetaminophen as needed for pain. F/u in 2 days with [facility] or ED if necessary for recheck of wound. Return precautions provided. Pt verbalized understanding and agreement with tx plan.  I personally performed the services described in this documentation, which was scribed in my presence. The recorded information has been reviewed and is accurate.   Junius Finner, PA-C 11/15/13 1534

## 2013-11-15 NOTE — ED Notes (Signed)
23 yo female from home presents with open pus filled abscess with swelling approximately 2 cm around the area on right lateral forearm.  She states that it has been there for 2 days; no medications or treatment have been taken or placed on affected area. Pt is 7 months pregnant and didn't know what to do for it.

## 2013-11-15 NOTE — Discharge Instructions (Signed)
Please take antibiotics as prescribed. You may take acetaminophen and ibuprofen as needed for pain.  Please use a warm damp compress such as a washcloth and gentle massage to area 3-4 times a day to help area drain.  If not improving in 2 days, follow up with a primary care provider or return to ER if necessary.  Follow up sooner for NEW or worsening symptoms.   Abscess An abscess (boil or furuncle) is an infected area on or under the skin. This area is filled with yellowish-white fluid (pus) and other material (debris). HOME CARE   Only take medicines as told by your doctor.  If you were given antibiotic medicine, take it as directed. Finish the medicine even if you start to feel better.  If gauze is used, follow your doctor's directions for changing the gauze.  To avoid spreading the infection:  Keep your abscess covered with a bandage.  Wash your hands well.  Do not share personal care items, towels, or whirlpools with others.  Avoid skin contact with others.  Keep your skin and clothes clean around the abscess.  Keep all doctor visits as told. GET HELP RIGHT AWAY IF:   You have more pain, puffiness (swelling), or redness in the wound site.  You have more fluid or blood coming from the wound site.  You have muscle aches, chills, or you feel sick.  You have a fever. Cellulitis Cellulitis is an infection of the skin and the tissue under the skin. The infected area is usually red and tender. This happens most often in the arms and lower legs. HOME CARE  Take your antibiotic medicine as told. Finish the medicine even if you start to feel better. Keep the infected arm or leg raised (elevated). Put a warm cloth on the area up to 4 times per day. Only take medicines as told by your doctor. Keep all doctor visits as told. GET HELP RIGHT AWAY IF:  You have a fever. You feel very sleepy. You throw up (vomit) or have watery poop (diarrhea). You feel sick and have muscle aches  and pains. You see red streaks on the skin coming from the infected area. Your red area gets bigger or turns a dark color. Your bone or joint under the infected area is painful after the skin heals. Your infection comes back in the same area or different area. You have a puffy (swollen) bump in the infected area. You have new symptoms. MAKE SURE YOU:  Understand these instructions. Will watch your condition. Will get help right away if you are not doing well or get worse. Document Released: 04/06/2008 Document Revised: 04/19/2012 Document Reviewed: 01/04/2012 [employer] Patient Information 2014 [employer], [place].  MAKE SURE YOU:   Understand these instructions.  Will watch your condition.  Will get help right away if you are not doing well or get worse. Document Released: 04/06/2008 Document Revised: 04/19/2012 Document Reviewed: 01/01/2012 [facility] Patient Information 2014 [place].

## 2013-11-22 ENCOUNTER — Encounter: Payer: Self-pay | Admitting: Advanced Practice Midwife

## 2013-11-22 ENCOUNTER — Ambulatory Visit (INDEPENDENT_AMBULATORY_CARE_PROVIDER_SITE_OTHER): Payer: Medicaid Other | Admitting: Advanced Practice Midwife

## 2013-11-22 ENCOUNTER — Encounter: Payer: Self-pay | Admitting: *Deleted

## 2013-11-22 VITALS — BP 103/64 | Temp 97.1°F | Wt 160.2 lb

## 2013-11-22 DIAGNOSIS — O0932 Supervision of pregnancy with insufficient antenatal care, second trimester: Secondary | ICD-10-CM

## 2013-11-22 DIAGNOSIS — O0930 Supervision of pregnancy with insufficient antenatal care, unspecified trimester: Secondary | ICD-10-CM

## 2013-11-22 LAB — POCT URINALYSIS DIP (DEVICE)
Glucose, UA: NEGATIVE mg/dL
HGB URINE DIPSTICK: NEGATIVE
KETONES UR: NEGATIVE mg/dL
Nitrite: NEGATIVE
PH: 6.5 (ref 5.0–8.0)
Protein, ur: 30 mg/dL — AB
Specific Gravity, Urine: 1.025 (ref 1.005–1.030)
Urobilinogen, UA: 1 mg/dL (ref 0.0–1.0)

## 2013-11-22 NOTE — Patient Instructions (Signed)
Third Trimester of Pregnancy  The third trimester is from week 29 through week 42, months 7 through 9. The third trimester is a time when the fetus is growing rapidly. At the end of the ninth month, the fetus is about 20 inches in length and weighs 6 10 pounds.   BODY CHANGES  Your body goes through many changes during pregnancy. The changes vary from woman to woman.    Your weight will continue to increase. You can expect to gain 25 35 pounds (11 16 kg) by the end of the pregnancy.   You may begin to get stretch marks on your hips, abdomen, and breasts.   You may urinate more often because the fetus is moving lower into your pelvis and pressing on your bladder.   You may develop or continue to have heartburn as a result of your pregnancy.   You may develop constipation because certain hormones are causing the muscles that push waste through your intestines to slow down.   You may develop hemorrhoids or swollen, bulging veins (varicose veins).   You may have pelvic pain because of the weight gain and pregnancy hormones relaxing your joints between the bones in your pelvis. Back aches may result from over exertion of the muscles supporting your posture.   Your breasts will continue to grow and be tender. A yellow discharge may leak from your breasts called colostrum.   Your belly button may stick out.   You may feel short of breath because of your expanding uterus.   You may notice the fetus "dropping," or moving lower in your abdomen.   You may have a bloody mucus discharge. This usually occurs a few days to a week before labor begins.   Your cervix becomes thin and soft (effaced) near your due date.  WHAT TO EXPECT AT YOUR PRENATAL EXAMS   You will have prenatal exams every 2 weeks until week 36. Then, you will have weekly prenatal exams. During a routine prenatal visit:   You will be weighed to make sure you and the fetus are growing normally.   Your blood pressure is taken.   Your abdomen will be  measured to track your baby's growth.   The fetal heartbeat will be listened to.   Any test results from the previous visit will be discussed.   You may have a cervical check near your due date to see if you have effaced.  At around 36 weeks, your caregiver will check your cervix. At the same time, your caregiver will also perform a test on the secretions of the vaginal tissue. This test is to determine if a type of bacteria, Group B streptococcus, is present. Your caregiver will explain this further.  Your caregiver may ask you:   What your birth plan is.   How you are feeling.   If you are feeling the baby move.   If you have had any abnormal symptoms, such as leaking fluid, bleeding, severe headaches, or abdominal cramping.   If you have any questions.  Other tests or screenings that may be performed during your third trimester include:   Blood tests that check for low iron levels (anemia).   Fetal testing to check the health, activity level, and growth of the fetus. Testing is done if you have certain medical conditions or if there are problems during the pregnancy.  FALSE LABOR  You may feel small, irregular contractions that eventually go away. These are called Braxton Hicks contractions, or   false labor. Contractions may last for hours, days, or even weeks before true labor sets in. If contractions come at regular intervals, intensify, or become painful, it is best to be seen by your caregiver.   SIGNS OF LABOR    Menstrual-like cramps.   Contractions that are 5 minutes apart or less.   Contractions that start on the top of the uterus and spread down to the lower abdomen and back.   A sense of increased pelvic pressure or back pain.   A watery or bloody mucus discharge that comes from the vagina.  If you have any of these signs before the 37th week of pregnancy, call your caregiver right away. You need to go to the hospital to get checked immediately.  HOME CARE INSTRUCTIONS    Avoid all  smoking, herbs, alcohol, and unprescribed drugs. These chemicals affect the formation and growth of the baby.   Follow your caregiver's instructions regarding medicine use. There are medicines that are either safe or unsafe to take during pregnancy.   Exercise only as directed by your caregiver. Experiencing uterine cramps is a good sign to stop exercising.   Continue to eat regular, healthy meals.   Wear a good support bra for breast tenderness.   Do not use hot tubs, steam rooms, or saunas.   Wear your seat belt at all times when driving.   Avoid raw meat, uncooked cheese, cat litter boxes, and soil used by cats. These carry germs that can cause birth defects in the baby.   Take your prenatal vitamins.   Try taking a stool softener (if your caregiver approves) if you develop constipation. Eat more high-fiber foods, such as fresh vegetables or fruit and whole grains. Drink plenty of fluids to keep your urine clear or pale yellow.   Take warm sitz baths to soothe any pain or discomfort caused by hemorrhoids. Use hemorrhoid cream if your caregiver approves.   If you develop varicose veins, wear support hose. Elevate your feet for 15 minutes, 3 4 times a day. Limit salt in your diet.   Avoid heavy lifting, wear low heal shoes, and practice good posture.   Rest a lot with your legs elevated if you have leg cramps or low back pain.   Visit your dentist if you have not gone during your pregnancy. Use a soft toothbrush to brush your teeth and be gentle when you floss.   A sexual relationship may be continued unless your caregiver directs you otherwise.   Do not travel far distances unless it is absolutely necessary and only with the approval of your caregiver.   Take prenatal classes to understand, practice, and ask questions about the labor and delivery.   Make a trial run to the hospital.   Pack your hospital bag.   Prepare the baby's nursery.   Continue to go to all your prenatal visits as directed  by your caregiver.  SEEK MEDICAL CARE IF:   You are unsure if you are in labor or if your water has broken.   You have dizziness.   You have mild pelvic cramps, pelvic pressure, or nagging pain in your abdominal area.   You have persistent nausea, vomiting, or diarrhea.   You have a bad smelling vaginal discharge.   You have pain with urination.  SEEK IMMEDIATE MEDICAL CARE IF:    You have a fever.   You are leaking fluid from your vagina.   You have spotting or bleeding from your vagina.     You have severe abdominal cramping or pain.   You have rapid weight loss or gain.   You have shortness of breath with chest pain.   You notice sudden or extreme swelling of your face, hands, ankles, feet, or legs.   You have not felt your baby move in over an hour.   You have severe headaches that do not go away with medicine.   You have vision changes.  Document Released: 10/13/2001 Document Revised: 06/21/2013 Document Reviewed: 12/20/2012  [employer] Patient Information 2014 [employer].

## 2013-11-22 NOTE — Progress Notes (Signed)
P=96  Pt reports unexplained bumps between toes/hand/rt arm.  Pt states she woke up yesterday with rt side swollen.

## 2013-11-22 NOTE — Progress Notes (Signed)
Does not want to do TOC today. Will need at next visit.  Has scattered papules on 3 places, less than 1mm.  On finger, foot and arm. No itching or pain. Unsure etiology. C/O swelling in right leg and knee, but no edema observed anywhere. Right knee is tender to movement of patella. Suspect this is dependent edema from sleeping position.

## 2013-12-06 ENCOUNTER — Ambulatory Visit (INDEPENDENT_AMBULATORY_CARE_PROVIDER_SITE_OTHER): Payer: Medicaid Other | Admitting: Obstetrics & Gynecology

## 2013-12-06 VITALS — BP 113/64 | Temp 97.4°F | Wt 164.0 lb

## 2013-12-06 DIAGNOSIS — Z3480 Encounter for supervision of other normal pregnancy, unspecified trimester: Secondary | ICD-10-CM

## 2013-12-06 DIAGNOSIS — O0930 Supervision of pregnancy with insufficient antenatal care, unspecified trimester: Secondary | ICD-10-CM

## 2013-12-06 DIAGNOSIS — O0932 Supervision of pregnancy with insufficient antenatal care, second trimester: Secondary | ICD-10-CM

## 2013-12-06 LAB — POCT URINALYSIS DIP (DEVICE)
Bilirubin Urine: NEGATIVE
Glucose, UA: NEGATIVE mg/dL
HGB URINE DIPSTICK: NEGATIVE
Nitrite: NEGATIVE
PROTEIN: NEGATIVE mg/dL
SPECIFIC GRAVITY, URINE: 1.025 (ref 1.005–1.030)
Urobilinogen, UA: 1 mg/dL (ref 0.0–1.0)
pH: 6.5 (ref 5.0–8.0)

## 2013-12-06 NOTE — Progress Notes (Signed)
P = 111   Pt reports back pain and irregular UC's- approx 5/hr.

## 2013-12-06 NOTE — Patient Instructions (Signed)
Third Trimester of Pregnancy  The third trimester is from week 29 through week 42, months 7 through 9. The third trimester is a time when the fetus is growing rapidly. At the end of the ninth month, the fetus is about 20 inches in length and weighs 6 10 pounds.   BODY CHANGES  Your body goes through many changes during pregnancy. The changes vary from woman to woman.    Your weight will continue to increase. You can expect to gain 25 35 pounds (11 16 kg) by the end of the pregnancy.   You may begin to get stretch marks on your hips, abdomen, and breasts.   You may urinate more often because the fetus is moving lower into your pelvis and pressing on your bladder.   You may develop or continue to have heartburn as a result of your pregnancy.   You may develop constipation because certain hormones are causing the muscles that push waste through your intestines to slow down.   You may develop hemorrhoids or swollen, bulging veins (varicose veins).   You may have pelvic pain because of the weight gain and pregnancy hormones relaxing your joints between the bones in your pelvis. Back aches may result from over exertion of the muscles supporting your posture.   Your breasts will continue to grow and be tender. A yellow discharge may leak from your breasts called colostrum.   Your belly button may stick out.   You may feel short of breath because of your expanding uterus.   You may notice the fetus "dropping," or moving lower in your abdomen.   You may have a bloody mucus discharge. This usually occurs a few days to a week before labor begins.   Your cervix becomes thin and soft (effaced) near your due date.  WHAT TO EXPECT AT YOUR PRENATAL EXAMS   You will have prenatal exams every 2 weeks until week 36. Then, you will have weekly prenatal exams. During a routine prenatal visit:   You will be weighed to make sure you and the fetus are growing normally.   Your blood pressure is taken.   Your abdomen will be  measured to track your baby's growth.   The fetal heartbeat will be listened to.   Any test results from the previous visit will be discussed.   You may have a cervical check near your due date to see if you have effaced.  At around 36 weeks, your caregiver will check your cervix. At the same time, your caregiver will also perform a test on the secretions of the vaginal tissue. This test is to determine if a type of bacteria, Group B streptococcus, is present. Your caregiver will explain this further.  Your caregiver may ask you:   What your birth plan is.   How you are feeling.   If you are feeling the baby move.   If you have had any abnormal symptoms, such as leaking fluid, bleeding, severe headaches, or abdominal cramping.   If you have any questions.  Other tests or screenings that may be performed during your third trimester include:   Blood tests that check for low iron levels (anemia).   Fetal testing to check the health, activity level, and growth of the fetus. Testing is done if you have certain medical conditions or if there are problems during the pregnancy.  FALSE LABOR  You may feel small, irregular contractions that eventually go away. These are called Braxton Hicks contractions, or   false labor. Contractions may last for hours, days, or even weeks before true labor sets in. If contractions come at regular intervals, intensify, or become painful, it is best to be seen by your caregiver.   SIGNS OF LABOR    Menstrual-like cramps.   Contractions that are 5 minutes apart or less.   Contractions that start on the top of the uterus and spread down to the lower abdomen and back.   A sense of increased pelvic pressure or back pain.   A watery or bloody mucus discharge that comes from the vagina.  If you have any of these signs before the 37th week of pregnancy, call your caregiver right away. You need to go to the hospital to get checked immediately.  HOME CARE INSTRUCTIONS    Avoid all  smoking, herbs, alcohol, and unprescribed drugs. These chemicals affect the formation and growth of the baby.   Follow your caregiver's instructions regarding medicine use. There are medicines that are either safe or unsafe to take during pregnancy.   Exercise only as directed by your caregiver. Experiencing uterine cramps is a good sign to stop exercising.   Continue to eat regular, healthy meals.   Wear a good support bra for breast tenderness.   Do not use hot tubs, steam rooms, or saunas.   Wear your seat belt at all times when driving.   Avoid raw meat, uncooked cheese, cat litter boxes, and soil used by cats. These carry germs that can cause birth defects in the baby.   Take your prenatal vitamins.   Try taking a stool softener (if your caregiver approves) if you develop constipation. Eat more high-fiber foods, such as fresh vegetables or fruit and whole grains. Drink plenty of fluids to keep your urine clear or pale yellow.   Take warm sitz baths to soothe any pain or discomfort caused by hemorrhoids. Use hemorrhoid cream if your caregiver approves.   If you develop varicose veins, wear support hose. Elevate your feet for 15 minutes, 3 4 times a day. Limit salt in your diet.   Avoid heavy lifting, wear low heal shoes, and practice good posture.   Rest a lot with your legs elevated if you have leg cramps or low back pain.   Visit your dentist if you have not gone during your pregnancy. Use a soft toothbrush to brush your teeth and be gentle when you floss.   A sexual relationship may be continued unless your caregiver directs you otherwise.   Do not travel far distances unless it is absolutely necessary and only with the approval of your caregiver.   Take prenatal classes to understand, practice, and ask questions about the labor and delivery.   Make a trial run to the hospital.   Pack your hospital bag.   Prepare the baby's nursery.   Continue to go to all your prenatal visits as directed  by your caregiver.  SEEK MEDICAL CARE IF:   You are unsure if you are in labor or if your water has broken.   You have dizziness.   You have mild pelvic cramps, pelvic pressure, or nagging pain in your abdominal area.   You have persistent nausea, vomiting, or diarrhea.   You have a bad smelling vaginal discharge.   You have pain with urination.  SEEK IMMEDIATE MEDICAL CARE IF:    You have a fever.   You are leaking fluid from your vagina.   You have spotting or bleeding from your vagina.     You have severe abdominal cramping or pain.   You have rapid weight loss or gain.   You have shortness of breath with chest pain.   You notice sudden or extreme swelling of your face, hands, ankles, feet, or legs.   You have not felt your baby move in over an hour.   You have severe headaches that do not go away with medicine.   You have vision changes.  Document Released: 10/13/2001 Document Revised: 06/21/2013 Document Reviewed: 12/20/2012  [employer] Patient Information 2014 [employer].

## 2013-12-06 NOTE — Progress Notes (Signed)
Has to prop up at night to breath properly, no cough, no HB.

## 2013-12-27 ENCOUNTER — Ambulatory Visit (INDEPENDENT_AMBULATORY_CARE_PROVIDER_SITE_OTHER): Payer: Medicaid Other | Admitting: Obstetrics and Gynecology

## 2013-12-27 VITALS — BP 118/65 | Temp 96.8°F | Wt 168.6 lb

## 2013-12-27 DIAGNOSIS — D509 Iron deficiency anemia, unspecified: Secondary | ICD-10-CM | POA: Insufficient documentation

## 2013-12-27 DIAGNOSIS — O0930 Supervision of pregnancy with insufficient antenatal care, unspecified trimester: Secondary | ICD-10-CM

## 2013-12-27 DIAGNOSIS — O99820 Streptococcus B carrier state complicating pregnancy: Secondary | ICD-10-CM

## 2013-12-27 DIAGNOSIS — O98212 Gonorrhea complicating pregnancy, second trimester: Secondary | ICD-10-CM

## 2013-12-27 DIAGNOSIS — Z22330 Carrier of Group B streptococcus: Secondary | ICD-10-CM

## 2013-12-27 DIAGNOSIS — O98219 Gonorrhea complicating pregnancy, unspecified trimester: Secondary | ICD-10-CM

## 2013-12-27 DIAGNOSIS — O09899 Supervision of other high risk pregnancies, unspecified trimester: Secondary | ICD-10-CM

## 2013-12-27 DIAGNOSIS — O0932 Supervision of pregnancy with insufficient antenatal care, second trimester: Secondary | ICD-10-CM

## 2013-12-27 LAB — POCT URINALYSIS DIP (DEVICE)
Glucose, UA: NEGATIVE mg/dL
HGB URINE DIPSTICK: NEGATIVE
Ketones, ur: NEGATIVE mg/dL
Nitrite: NEGATIVE
PH: 7 (ref 5.0–8.0)
PROTEIN: 30 mg/dL — AB
SPECIFIC GRAVITY, URINE: 1.02 (ref 1.005–1.030)
UROBILINOGEN UA: 1 mg/dL (ref 0.0–1.0)

## 2013-12-27 LAB — OB RESULTS CONSOLE GBS: STREP GROUP B AG: POSITIVE

## 2013-12-27 NOTE — Progress Notes (Signed)
GC/CT, TOC and GBS done. Rare B-H. Plans breast. Contraception undecided, not PPS. Hgb 9.8> iron foods and 1 iron tab/d.

## 2013-12-27 NOTE — Addendum Note (Signed)
Addended by: Danae OrleansPOE, Evaristo Tsuda C on: 12/27/2013 02:56 PM   Modules accepted: Orders

## 2013-12-27 NOTE — Progress Notes (Signed)
Pulse- 99 Patient reports pain and pressure in pelvis and a few braxton hicks contractions

## 2013-12-27 NOTE — Patient Instructions (Signed)
Third Trimester of Pregnancy  The third trimester is from week 29 through week 42, months 7 through 9. The third trimester is a time when the fetus is growing rapidly. At the end of the ninth month, the fetus is about 20 inches in length and weighs 6 10 pounds.   BODY CHANGES  Your body goes through many changes during pregnancy. The changes vary from woman to woman.    Your weight will continue to increase. You can expect to gain 25 35 pounds (11 16 kg) by the end of the pregnancy.   You may begin to get stretch marks on your hips, abdomen, and breasts.   You may urinate more often because the fetus is moving lower into your pelvis and pressing on your bladder.   You may develop or continue to have heartburn as a result of your pregnancy.   You may develop constipation because certain hormones are causing the muscles that push waste through your intestines to slow down.   You may develop hemorrhoids or swollen, bulging veins (varicose veins).   You may have pelvic pain because of the weight gain and pregnancy hormones relaxing your joints between the bones in your pelvis. Back aches may result from over exertion of the muscles supporting your posture.   Your breasts will continue to grow and be tender. A yellow discharge may leak from your breasts called colostrum.   Your belly button may stick out.   You may feel short of breath because of your expanding uterus.   You may notice the fetus "dropping," or moving lower in your abdomen.   You may have a bloody mucus discharge. This usually occurs a few days to a week before labor begins.   Your cervix becomes thin and soft (effaced) near your due date.  WHAT TO EXPECT AT YOUR PRENATAL EXAMS   You will have prenatal exams every 2 weeks until week 36. Then, you will have weekly prenatal exams. During a routine prenatal visit:   You will be weighed to make sure you and the fetus are growing normally.   Your blood pressure is taken.   Your abdomen will be  measured to track your baby's growth.   The fetal heartbeat will be listened to.   Any test results from the previous visit will be discussed.   You may have a cervical check near your due date to see if you have effaced.  At around 36 weeks, your caregiver will check your cervix. At the same time, your caregiver will also perform a test on the secretions of the vaginal tissue. This test is to determine if a type of bacteria, Group B streptococcus, is present. Your caregiver will explain this further.  Your caregiver may ask you:   What your birth plan is.   How you are feeling.   If you are feeling the baby move.   If you have had any abnormal symptoms, such as leaking fluid, bleeding, severe headaches, or abdominal cramping.   If you have any questions.  Other tests or screenings that may be performed during your third trimester include:   Blood tests that check for low iron levels (anemia).   Fetal testing to check the health, activity level, and growth of the fetus. Testing is done if you have certain medical conditions or if there are problems during the pregnancy.  FALSE LABOR  You may feel small, irregular contractions that eventually go away. These are called Braxton Hicks contractions, or   false labor. Contractions may last for hours, days, or even weeks before true labor sets in. If contractions come at regular intervals, intensify, or become painful, it is best to be seen by your caregiver.   SIGNS OF LABOR    Menstrual-like cramps.   Contractions that are 5 minutes apart or less.   Contractions that start on the top of the uterus and spread down to the lower abdomen and back.   A sense of increased pelvic pressure or back pain.   A watery or bloody mucus discharge that comes from the vagina.  If you have any of these signs before the 37th week of pregnancy, call your caregiver right away. You need to go to the hospital to get checked immediately.  HOME CARE INSTRUCTIONS    Avoid all  smoking, herbs, alcohol, and unprescribed drugs. These chemicals affect the formation and growth of the baby.   Follow your caregiver's instructions regarding medicine use. There are medicines that are either safe or unsafe to take during pregnancy.   Exercise only as directed by your caregiver. Experiencing uterine cramps is a good sign to stop exercising.   Continue to eat regular, healthy meals.   Wear a good support bra for breast tenderness.   Do not use hot tubs, steam rooms, or saunas.   Wear your seat belt at all times when driving.   Avoid raw meat, uncooked cheese, cat litter boxes, and soil used by cats. These carry germs that can cause birth defects in the baby.   Take your prenatal vitamins.   Try taking a stool softener (if your caregiver approves) if you develop constipation. Eat more high-fiber foods, such as fresh vegetables or fruit and whole grains. Drink plenty of fluids to keep your urine clear or pale yellow.   Take warm sitz baths to soothe any pain or discomfort caused by hemorrhoids. Use hemorrhoid cream if your caregiver approves.   If you develop varicose veins, wear support hose. Elevate your feet for 15 minutes, 3 4 times a day. Limit salt in your diet.   Avoid heavy lifting, wear low heal shoes, and practice good posture.   Rest a lot with your legs elevated if you have leg cramps or low back pain.   Visit your dentist if you have not gone during your pregnancy. Use a soft toothbrush to brush your teeth and be gentle when you floss.   A sexual relationship may be continued unless your caregiver directs you otherwise.   Do not travel far distances unless it is absolutely necessary and only with the approval of your caregiver.   Take prenatal classes to understand, practice, and ask questions about the labor and delivery.   Make a trial run to the hospital.   Pack your hospital bag.   Prepare the baby's nursery.   Continue to go to all your prenatal visits as directed  by your caregiver.  SEEK MEDICAL CARE IF:   You are unsure if you are in labor or if your water has broken.   You have dizziness.   You have mild pelvic cramps, pelvic pressure, or nagging pain in your abdominal area.   You have persistent nausea, vomiting, or diarrhea.   You have a bad smelling vaginal discharge.   You have pain with urination.  SEEK IMMEDIATE MEDICAL CARE IF:    You have a fever.   You are leaking fluid from your vagina.   You have spotting or bleeding from your vagina.     You have severe abdominal cramping or pain.   You have rapid weight loss or gain.   You have shortness of breath with chest pain.   You notice sudden or extreme swelling of your face, hands, ankles, feet, or legs.   You have not felt your baby move in over an hour.   You have severe headaches that do not go away with medicine.   You have vision changes.  Document Released: 10/13/2001 Document Revised: 06/21/2013 Document Reviewed: 12/20/2012  [employer] Patient Information 2014 [employer].

## 2013-12-27 NOTE — Addendum Note (Signed)
Addended by: Aldona LentoFISHER, Tirso Laws L on: 12/27/2013 02:55 PM   Modules accepted: Orders

## 2013-12-28 LAB — GC/CHLAMYDIA PROBE AMP
CT Probe RNA: NEGATIVE
GC Probe RNA: POSITIVE — AB

## 2013-12-29 DIAGNOSIS — O99820 Streptococcus B carrier state complicating pregnancy: Secondary | ICD-10-CM | POA: Insufficient documentation

## 2013-12-29 LAB — CULTURE, BETA STREP (GROUP B ONLY)

## 2014-01-01 ENCOUNTER — Inpatient Hospital Stay (HOSPITAL_COMMUNITY)
Admission: AD | Admit: 2014-01-01 | Discharge: 2014-01-01 | Disposition: A | Discharge status: Home / Self Care | Payer: Medicaid Other | Source: Ambulatory Visit | Attending: Obstetrics & Gynecology | Admitting: Obstetrics & Gynecology

## 2014-01-01 ENCOUNTER — Encounter (HOSPITAL_COMMUNITY): Payer: Self-pay | Admitting: *Deleted

## 2014-01-01 ENCOUNTER — Telehealth: Payer: Self-pay

## 2014-01-01 DIAGNOSIS — O4700 False labor before 37 completed weeks of gestation, unspecified trimester: Secondary | ICD-10-CM | POA: Insufficient documentation

## 2014-01-01 DIAGNOSIS — O479 False labor, unspecified: Secondary | ICD-10-CM

## 2014-01-01 NOTE — Telephone Encounter (Signed)
Called pt. On both numbers. Left messages on both numbers stating we are calling with results, it is important, if you could call us back at your earliest convenience.

## 2014-01-01 NOTE — MAU Note (Signed)
Pt G3 P2 at 36.4wks having contractions and bloody show.

## 2014-01-01 NOTE — Discharge Instructions (Signed)

## 2014-01-01 NOTE — Telephone Encounter (Signed)
Message copied by Louanna RawAMPBELL, Rosemond Lyttle M on Mon Jan 01, 2014  9:39 AM ------      Message from: POE, DEIRDRE C      Created: Fri Dec 29, 2013  8:33 AM       Please treat Rocephin 250 mg IM and azithromycin 1 gm. This is second pos GC. Confirm partner treated and advise abstinence after tx ------

## 2014-01-01 NOTE — MAU Note (Signed)
PT SAYS SHE STARTED HURTING BAD AT 6PM.    PNC- DOWNSTAIRS-  CLINIC.   VE LAST WEEK -  DOES NOT REMENMBER.     DENIES HSV AND RMSA.

## 2014-01-02 NOTE — Telephone Encounter (Signed)
Called pt. And informed her of positive GC. Pt. Will be here for an appointment tomorrow and will be treated at that time. Pt. Verbalized understanding and had no other questions or concerns.

## 2014-01-03 ENCOUNTER — Inpatient Hospital Stay (HOSPITAL_COMMUNITY)
Admission: AD | Admit: 2014-01-03 | Discharge: 2014-01-03 | Disposition: A | Discharge status: Home / Self Care | Payer: Medicaid Other | Source: Ambulatory Visit | Attending: Obstetrics and Gynecology | Admitting: Obstetrics and Gynecology

## 2014-01-03 ENCOUNTER — Encounter (HOSPITAL_COMMUNITY): Payer: Self-pay | Admitting: *Deleted

## 2014-01-03 ENCOUNTER — Encounter: Payer: Medicaid Other | Admitting: Family Medicine

## 2014-01-03 DIAGNOSIS — O4690 Antepartum hemorrhage, unspecified, unspecified trimester: Secondary | ICD-10-CM | POA: Insufficient documentation

## 2014-01-03 DIAGNOSIS — R109 Unspecified abdominal pain: Secondary | ICD-10-CM | POA: Insufficient documentation

## 2014-01-03 DIAGNOSIS — O479 False labor, unspecified: Secondary | ICD-10-CM

## 2014-01-03 DIAGNOSIS — O4700 False labor before 37 completed weeks of gestation, unspecified trimester: Secondary | ICD-10-CM | POA: Insufficient documentation

## 2014-01-03 LAB — WET PREP, GENITAL
Trich, Wet Prep: NONE SEEN
YEAST WET PREP: NONE SEEN

## 2014-01-03 LAB — URINALYSIS, ROUTINE W REFLEX MICROSCOPIC
Bilirubin Urine: NEGATIVE
Glucose, UA: NEGATIVE mg/dL
Hgb urine dipstick: NEGATIVE
Ketones, ur: 15 mg/dL — AB
Nitrite: NEGATIVE
PROTEIN: NEGATIVE mg/dL
SPECIFIC GRAVITY, URINE: 1.02 (ref 1.005–1.030)
UROBILINOGEN UA: 0.2 mg/dL (ref 0.0–1.0)
pH: 6.5 (ref 5.0–8.0)

## 2014-01-03 LAB — URINE MICROSCOPIC-ADD ON

## 2014-01-03 MED ORDER — AZITHROMYCIN 250 MG PO TABS
1000.0000 mg | ORAL_TABLET | Freq: Once | ORAL | Status: AC
  Administered 2014-01-03: 1000 mg via ORAL
  Filled 2014-01-03: qty 4

## 2014-01-03 MED ORDER — CEFTRIAXONE SODIUM 250 MG IJ SOLR
250.0000 mg | Freq: Once | INTRAMUSCULAR | Status: AC
  Administered 2014-01-03: 250 mg via INTRAMUSCULAR
  Filled 2014-01-03: qty 250

## 2014-01-03 NOTE — Discharge Instructions (Signed)

## 2014-01-03 NOTE — MAU Provider Note (Signed)
Chief Complaint:  Vaginal Bleeding and Contractions  Deanna Murray is a 23 y.o.  [id number] with IUP at [redacted]w[redacted]d presenting for Vaginal Bleeding and Contractions She reports irregular contraction starting about 4pm today and then a "gush" of blood slightly less than a period while taking a shower @ 6 pm.  She also reports "9/10 pressure like lower abdominal pain in her cervix that has been constant for the past 15 mins. She reports good FM. Denies any fevers, chills, N/V/D or dysuria.  She has not been treated for [facility] that was diagnosed yesterday. She denies any complications with this or previous pregnancies. She says her first delivery was breech.   PNC at [facility] since 24 wks.   Menstrual History: OB History   Grav Para Term Preterm Abortions TAB SAB Ect Mult Living   3 2 2       2       Patient's last menstrual period was 05/29/2013.    Past Medical History  Diagnosis Date  . Seizure   . Pregnancy     7 months    Past Surgical History  Procedure Laterality Date  . No past surgeries      Family History  Problem Relation Age of Onset  . Alcohol abuse Neg Hx   . Arthritis Neg Hx   . Asthma Neg Hx   . Birth defects Neg Hx   . Cancer Neg Hx   . COPD Neg Hx   . Depression Neg Hx   . Diabetes Neg Hx   . Drug abuse Neg Hx   . Early death Neg Hx   . Hearing loss Neg Hx   . Heart disease Neg Hx   . Hyperlipidemia Neg Hx   . Hypertension Neg Hx   . Kidney disease Neg Hx   . Learning disabilities Neg Hx   . Mental illness Neg Hx   . Mental retardation Neg Hx   . Miscarriages / Stillbirths Neg Hx   . Stroke Neg Hx   . Vision loss Neg Hx     History  Substance Use Topics  . Smoking status: Never Smoker   . Smokeless tobacco: Never Used  . Alcohol Use: No     No Known Allergies  Prescriptions prior to admission  Medication Sig Dispense Refill  . Prenatal Vit-Fe Fumarate-FA (PRENATAL MULTIVITAMIN) TABS tablet Take 1 tablet by mouth daily at 12 noon.        Review of  Systems - Negative except for what is mentioned in HPI.  Physical Exam  Blood pressure 131/72, pulse 104, temperature 98.3 F (36.8 C), temperature source Oral, resp. rate 20, last menstrual period 05/29/2013. GENERAL: Well-developed, well-nourished female in no acute distress.  LUNGS: Clear to auscultation bilaterally.  HEART: Regular rate and rhythm. ABDOMEN: Soft, nontender, nondistended, gravid.  EXTREMITIES: Nontender, no edema, 2+ distal pulses. FHT:  Baseline rate 135 bpm   Variability moderate  Accelerations present   Decelerations occasional variable that resolved with repositioning; Contractions: Irregular Speculum exam: No pooling or blood noted; Thick white discharge Dilation: Closed Effacement (%): Thick Cervical Position: Posterior Exam by:: Vira Blanco, RN   Labs: Results for orders placed during the hospital encounter of 01/03/14 (from the past 24 hour(s))  WET PREP, GENITAL   Collection Time    01/03/14  9:23 PM      Result Value Ref Range   Yeast Wet Prep HPF POC NONE SEEN  NONE SEEN   Trich, Wet Prep NONE SEEN  NONE  SEEN   Clue Cells Wet Prep HPF POC FEW (*) NONE SEEN   WBC, Wet Prep HPF POC MODERATE (*) NONE SEEN  URINALYSIS, ROUTINE W REFLEX MICROSCOPIC   Collection Time    01/03/14  9:23 PM      Result Value Ref Range   Color, Urine YELLOW  YELLOW   APPearance CLEAR  CLEAR   Specific Gravity, Urine 1.020  1.005 - 1.030   pH 6.5  5.0 - 8.0   Glucose, UA NEGATIVE  NEGATIVE mg/dL   Hgb urine dipstick NEGATIVE  NEGATIVE   Bilirubin Urine NEGATIVE  NEGATIVE   Ketones, ur 15 (*) NEGATIVE mg/dL   Protein, ur NEGATIVE  NEGATIVE mg/dL   Urobilinogen, UA 0.2  0.0 - 1.0 mg/dL   Nitrite NEGATIVE  NEGATIVE   Leukocytes, UA SMALL (*) NEGATIVE  URINE MICROSCOPIC-ADD ON   Collection Time    01/03/14  9:23 PM      Result Value Ref Range   Squamous Epithelial / LPF FEW (*) RARE   WBC, UA 7-10  <3 WBC/hpf   RBC / HPF 3-6  <3 RBC/hpf   Bacteria, UA FEW (*)  RARE   Imaging Studies:  No results found.  Assessment: Deanna Murray is  23 y.o. [id number] at 4719w6d presents with Vaginal Bleeding and Contractions Speculum exam and fern negative for ruptured membranes. Irregular contractions with closed cervix. FHT reactive and reassuring due to a few variables that resolved. Pt did have occasional variable decels but with great variability. Overall reassuring and reactive.  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 No blood in vault is reassuring. Pt given strict return precautions and overall reassuring infant. Pt to follow up as previously scheduled  I spoke with and examined patient and agree with resident's note and plan of care.  Tawana ScaleMichael Ryan Kyeshia Zinn, MD OB Fellow 01/04/2014 5:44 AM

## 2014-01-03 NOTE — MAU Note (Signed)
Taking a shower and noticed blood running down legs.  Started having contractions.  No intercourse since early pregnancy

## 2014-01-04 LAB — RAPID URINE DRUG SCREEN, HOSP PERFORMED
AMPHETAMINES: NOT DETECTED
Barbiturates: NOT DETECTED
Benzodiazepines: NOT DETECTED
Cocaine: NOT DETECTED
Opiates: NOT DETECTED
TETRAHYDROCANNABINOL: NOT DETECTED

## 2014-01-08 ENCOUNTER — Ambulatory Visit (INDEPENDENT_AMBULATORY_CARE_PROVIDER_SITE_OTHER): Payer: Medicaid Other | Admitting: Obstetrics & Gynecology

## 2014-01-08 VITALS — BP 130/85 | Temp 98.1°F | Wt 179.8 lb

## 2014-01-08 DIAGNOSIS — Z22330 Carrier of Group B streptococcus: Secondary | ICD-10-CM

## 2014-01-08 DIAGNOSIS — O0933 Supervision of pregnancy with insufficient antenatal care, third trimester: Secondary | ICD-10-CM

## 2014-01-08 DIAGNOSIS — A499 Bacterial infection, unspecified: Secondary | ICD-10-CM

## 2014-01-08 DIAGNOSIS — B9689 Other specified bacterial agents as the cause of diseases classified elsewhere: Secondary | ICD-10-CM

## 2014-01-08 DIAGNOSIS — O99820 Streptococcus B carrier state complicating pregnancy: Secondary | ICD-10-CM

## 2014-01-08 DIAGNOSIS — O0930 Supervision of pregnancy with insufficient antenatal care, unspecified trimester: Secondary | ICD-10-CM

## 2014-01-08 DIAGNOSIS — N760 Acute vaginitis: Secondary | ICD-10-CM

## 2014-01-08 DIAGNOSIS — O09899 Supervision of other high risk pregnancies, unspecified trimester: Secondary | ICD-10-CM

## 2014-01-08 NOTE — Progress Notes (Signed)
Labor was r/o 01/03/14 in MAU

## 2014-01-08 NOTE — Patient Instructions (Signed)

## 2014-01-08 NOTE — Progress Notes (Signed)
Pulse- 98 

## 2014-01-12 NOTE — MAU Provider Note (Signed)
Attestation of Attending Supervision of Advanced Practitioner: Evaluation and management procedures were performed by the PA/NP/CNM/OB Fellow under my supervision/collaboration. Chart reviewed and agree with management and plan.  Cassady Turano V 01/12/2014 6:50 PM

## 2014-01-15 ENCOUNTER — Ambulatory Visit (INDEPENDENT_AMBULATORY_CARE_PROVIDER_SITE_OTHER): Payer: Medicaid Other | Admitting: Family Medicine

## 2014-01-15 VITALS — BP 132/75 | Temp 97.6°F | Wt 181.4 lb

## 2014-01-15 DIAGNOSIS — O98219 Gonorrhea complicating pregnancy, unspecified trimester: Secondary | ICD-10-CM

## 2014-01-15 DIAGNOSIS — O09899 Supervision of other high risk pregnancies, unspecified trimester: Secondary | ICD-10-CM

## 2014-01-15 DIAGNOSIS — O0930 Supervision of pregnancy with insufficient antenatal care, unspecified trimester: Secondary | ICD-10-CM

## 2014-01-15 DIAGNOSIS — O0933 Supervision of pregnancy with insufficient antenatal care, third trimester: Secondary | ICD-10-CM

## 2014-01-15 DIAGNOSIS — O99820 Streptococcus B carrier state complicating pregnancy: Secondary | ICD-10-CM

## 2014-01-15 DIAGNOSIS — O98212 Gonorrhea complicating pregnancy, second trimester: Secondary | ICD-10-CM

## 2014-01-15 DIAGNOSIS — Z22330 Carrier of Group B streptococcus: Secondary | ICD-10-CM

## 2014-01-15 LAB — POCT URINALYSIS DIP (DEVICE)
BILIRUBIN URINE: NEGATIVE
GLUCOSE, UA: NEGATIVE mg/dL
Hgb urine dipstick: NEGATIVE
Ketones, ur: NEGATIVE mg/dL
NITRITE: NEGATIVE
Protein, ur: 30 mg/dL — AB
Specific Gravity, Urine: 1.02 (ref 1.005–1.030)
Urobilinogen, UA: 2 mg/dL — ABNORMAL HIGH (ref 0.0–1.0)
pH: 7 (ref 5.0–8.0)

## 2014-01-15 LAB — OB RESULTS CONSOLE GC/CHLAMYDIA
Chlamydia: NEGATIVE
Gonorrhea: NEGATIVE

## 2014-01-15 NOTE — Patient Instructions (Signed)
Third Trimester of Pregnancy The third trimester is from week 29 through week 42, months 7 through 9. The third trimester is a time when the fetus is growing rapidly. At the end of the ninth month, the fetus is about 20 inches in length and weighs 6 10 pounds.  BODY CHANGES Your body goes through many changes during pregnancy. The changes vary from woman to woman.   Your weight will continue to increase. You can expect to gain 25 35 pounds (11 16 kg) by the end of the pregnancy.  You may begin to get stretch marks on your hips, abdomen, and breasts.  You may urinate more often because the fetus is moving lower into your pelvis and pressing on your bladder.  You may develop or continue to have heartburn as a result of your pregnancy.  You may develop constipation because certain hormones are causing the muscles that push waste through your intestines to slow down.  You may develop hemorrhoids or swollen, bulging veins (varicose veins).  You may have pelvic pain because of the weight gain and pregnancy hormones relaxing your joints between the bones in your pelvis. Back aches may result from over exertion of the muscles supporting your posture.  Your breasts will continue to grow and be tender. A yellow discharge may leak from your breasts called colostrum.  Your belly button may stick out.  You may feel short of breath because of your expanding uterus.  You may notice the fetus "dropping," or moving lower in your abdomen.  You may have a bloody mucus discharge. This usually occurs a few days to a week before labor begins.  Your cervix becomes thin and soft (effaced) near your due date. WHAT TO EXPECT AT YOUR PRENATAL EXAMS  You will have prenatal exams every 2 weeks until week 36. Then, you will have weekly prenatal exams. During a routine prenatal visit:  You will be weighed to make sure you and the fetus are growing normally.  Your blood pressure is taken.  Your abdomen will  be measured to track your baby's growth.  The fetal heartbeat will be listened to.  Any test results from the previous visit will be discussed.  You may have a cervical check near your due date to see if you have effaced. At around 36 weeks, your caregiver will check your cervix. At the same time, your caregiver will also perform a test on the secretions of the vaginal tissue. This test is to determine if a type of bacteria, Group B streptococcus, is present. Your caregiver will explain this further. Your caregiver may ask you:  What your birth plan is.  How you are feeling.  If you are feeling the baby move.  If you have had any abnormal symptoms, such as leaking fluid, bleeding, severe headaches, or abdominal cramping.  If you have any questions. Other tests or screenings that may be performed during your third trimester include:  Blood tests that check for low iron levels (anemia).  Fetal testing to check the health, activity level, and growth of the fetus. Testing is done if you have certain medical conditions or if there are problems during the pregnancy. FALSE LABOR You may feel small, irregular contractions that eventually go away. These are called Braxton Hicks contractions, or false labor. Contractions may last for hours, days, or even weeks before true labor sets in. If contractions come at regular intervals, intensify, or become painful, it is best to be seen by your caregiver.    SIGNS OF LABOR   Menstrual-like cramps.  Contractions that are 5 minutes apart or less.  Contractions that start on the top of the uterus and spread down to the lower abdomen and back.  A sense of increased pelvic pressure or back pain.  A watery or bloody mucus discharge that comes from the vagina. If you have any of these signs before the 37th week of pregnancy, call your caregiver right away. You need to go to the hospital to get checked immediately. HOME CARE INSTRUCTIONS   Avoid all  smoking, herbs, alcohol, and unprescribed drugs. These chemicals affect the formation and growth of the baby.  Follow your caregiver's instructions regarding medicine use. There are medicines that are either safe or unsafe to take during pregnancy.  Exercise only as directed by your caregiver. Experiencing uterine cramps is a good sign to stop exercising.  Continue to eat regular, healthy meals.  Wear a good support bra for breast tenderness.  Do not use hot tubs, steam rooms, or saunas.  Wear your seat belt at all times when driving.  Avoid raw meat, uncooked cheese, cat litter boxes, and soil used by cats. These carry germs that can cause birth defects in the baby.  Take your prenatal vitamins.  Try taking a stool softener (if your caregiver approves) if you develop constipation. Eat more high-fiber foods, such as fresh vegetables or fruit and whole grains. Drink plenty of fluids to keep your urine clear or pale yellow.  Take warm sitz baths to soothe any pain or discomfort caused by hemorrhoids. Use hemorrhoid cream if your caregiver approves.  If you develop varicose veins, wear support hose. Elevate your feet for 15 minutes, 3 4 times a day. Limit salt in your diet.  Avoid heavy lifting, wear low heal shoes, and practice good posture.  Rest a lot with your legs elevated if you have leg cramps or low back pain.  Visit your dentist if you have not gone during your pregnancy. Use a soft toothbrush to brush your teeth and be gentle when you floss.  A sexual relationship may be continued unless your caregiver directs you otherwise.  Do not travel far distances unless it is absolutely necessary and only with the approval of your caregiver.  Take prenatal classes to understand, practice, and ask questions about the labor and delivery.  Make a trial run to the hospital.  Pack your hospital bag.  Prepare the baby's nursery.  Continue to go to all your prenatal visits as directed  by your caregiver. SEEK MEDICAL CARE IF:  You are unsure if you are in labor or if your water has broken.  You have dizziness.  You have mild pelvic cramps, pelvic pressure, or nagging pain in your abdominal area.  You have persistent nausea, vomiting, or diarrhea.  You have a bad smelling vaginal discharge.  You have pain with urination. SEEK IMMEDIATE MEDICAL CARE IF:   You have a fever.  You are leaking fluid from your vagina.  You have spotting or bleeding from your vagina.  You have severe abdominal cramping or pain.  You have rapid weight loss or gain.  You have shortness of breath with chest pain.  You notice sudden or extreme swelling of your face, hands, ankles, feet, or legs.  You have not felt your baby move in over an hour.  You have severe headaches that do not go away with medicine.  You have vision changes. Document Released: 10/13/2001 Document Revised: 06/21/2013 Document Reviewed:   12/20/2012 [employer] Patient Information 2014 [employer].  Breastfeeding Deciding to breastfeed is one of the best choices you can make for you and your baby. A change in hormones during pregnancy causes your breast tissue to grow and increases the number and size of your milk ducts. These hormones also allow proteins, sugars, and fats from your blood supply to make breast milk in your milk-producing glands. Hormones prevent breast milk from being released before your baby is born as well as prompt milk flow after birth. Once breastfeeding has begun, thoughts of your baby, as well as his or her sucking or crying, can stimulate the release of milk from your milk-producing glands.  BENEFITS OF BREASTFEEDING For Your Baby  Your first milk (colostrum) helps your baby's digestive system function better.   There are antibodies in your milk that help your baby fight off infections.   Your baby has a lower incidence of asthma, allergies, and sudden infant death syndrome.    The nutrients in breast milk are better for your baby than infant formulas and are designed uniquely for your baby's needs.   Breast milk improves your baby's brain development.   Your baby is less likely to develop other conditions, such as childhood obesity, asthma, or type 2 diabetes mellitus.  For You   Breastfeeding helps to create a very special bond between you and your baby.   Breastfeeding is convenient. Breast milk is always available at the correct temperature and costs nothing.   Breastfeeding helps to burn calories and helps you lose the weight gained during pregnancy.   Breastfeeding makes your uterus contract to its prepregnancy size faster and slows bleeding (lochia) after you give birth.   Breastfeeding helps to lower your risk of developing type 2 diabetes mellitus, osteoporosis, and breast or ovarian cancer later in life. SIGNS THAT YOUR BABY IS HUNGRY Early Signs of Hunger  Increased alertness or activity.  Stretching.  Movement of the head from side to side.  Movement of the head and opening of the mouth when the corner of the mouth or cheek is stroked (rooting).  Increased sucking sounds, smacking lips, cooing, sighing, or squeaking.  Hand-to-mouth movements.  Increased sucking of fingers or hands. Late Signs of Hunger  Fussing.  Intermittent crying. Extreme Signs of Hunger Signs of extreme hunger will require calming and consoling before your baby will be able to breastfeed successfully. Do not wait for the following signs of extreme hunger to occur before you initiate breastfeeding:   Restlessness.  A loud, strong cry.   Screaming. BREASTFEEDING BASICS Breastfeeding Initiation  Find a comfortable place to sit or lie down, with your neck and back well supported.  Place a pillow or rolled up blanket under your baby to bring him or her to the level of your breast (if you are seated). Nursing pillows are specially designed to help  support your arms and your baby while you breastfeed.  Make sure that your baby's abdomen is facing your abdomen.   Gently massage your breast. With your fingertips, massage from your chest wall toward your nipple in a circular motion. This encourages milk flow. You may need to continue this action during the feeding if your milk flows slowly.  Support your breast with 4 fingers underneath and your thumb above your nipple. Make sure your fingers are well away from your nipple and your baby's mouth.   Stroke your baby's lips gently with your finger or nipple.   When your baby's mouth is   open wide enough, quickly bring your baby to your breast, placing your entire nipple and as much of the colored area around your nipple (areola) as possible into your baby's mouth.   More areola should be visible above your baby's upper lip than below the lower lip.   Your baby's tongue should be between his or her lower gum and your breast.   Ensure that your baby's mouth is correctly positioned around your nipple (latched). Your baby's lips should create a seal on your breast and be turned out (everted).  It is common for your baby to suck about 2 3 minutes in order to start the flow of breast milk. Latching Teaching your baby how to latch on to your breast properly is very important. An improper latch can cause nipple pain and decreased milk supply for you and poor weight gain in your baby. Also, if your baby is not latched onto your nipple properly, he or she may swallow some air during feeding. This can make your baby fussy. Burping your baby when you switch breasts during the feeding can help to get rid of the air. However, teaching your baby to latch on properly is still the best way to prevent fussiness from swallowing air while breastfeeding. Signs that your baby has successfully latched on to your nipple:    Silent tugging or silent sucking, without causing you pain.   Swallowing heard  between every 3 4 sucks.    Muscle movement above and in front of his or her ears while sucking.  Signs that your baby has not successfully latched on to nipple:   Sucking sounds or smacking sounds from your baby while breastfeeding.  Nipple pain. If you think your baby has not latched on correctly, slip your finger into the corner of your baby's mouth to break the suction and place it between your baby's gums. Attempt breastfeeding initiation again. Signs of Successful Breastfeeding Signs from your baby:   A gradual decrease in the number of sucks or complete cessation of sucking.   Falling asleep.   Relaxation of his or her body.   Retention of a small amount of milk in his or her mouth.   Letting go of your breast by himself or herself. Signs from you:  Breasts that have increased in firmness, weight, and size 1 3 hours after feeding.   Breasts that are softer immediately after breastfeeding.  Increased milk volume, as well as a change in milk consistency and color by the 5th day of breastfeeding.   Nipples that are not sore, cracked, or bleeding. Signs That Your Baby is Getting Enough Milk  Wetting at least 3 diapers in a 24-hour period. The urine should be clear and pale yellow by age 5 days.  At least 3 stools in a 24-hour period by age 5 days. The stool should be soft and yellow.  At least 3 stools in a 24-hour period by age 7 days. The stool should be seedy and yellow.  No loss of weight greater than 10% of birth weight during the first 3 days of age.  Average weight gain of 4 7 ounces (120 210 mL) per week after age 4 days.  Consistent daily weight gain by age 5 days, without weight loss after the age of 2 weeks. After a feeding, your baby may spit up a small amount. This is common. BREASTFEEDING FREQUENCY AND DURATION Frequent feeding will help you make more milk and can prevent sore nipples and breast engorgement.   Breastfeed when you feel the need to  reduce the fullness of your breasts or when your baby shows signs of hunger. This is called "breastfeeding on demand." Avoid introducing a pacifier to your baby while you are working to establish breastfeeding (the first 4 6 weeks after your baby is born). After this time you may choose to use a pacifier. Research has shown that pacifier use during the first year of a baby's life decreases the risk of sudden infant death syndrome (SIDS). Allow your baby to feed on each breast as long as he or she wants. Breastfeed until your baby is finished feeding. When your baby unlatches or falls asleep while feeding from the first breast, offer the second breast. Because newborns are often sleepy in the first few weeks of life, you may need to awaken your baby to get him or her to feed. Breastfeeding times will vary from baby to baby. However, the following rules can serve as a guide to help you ensure that your baby is properly fed:  Newborns (babies 4 weeks of age or younger) may breastfeed every 1 3 hours.  Newborns should not go longer than 3 hours during the day or 5 hours during the night without breastfeeding.  You should breastfeed your baby a minimum of 8 times in a 24-hour period until you begin to introduce solid foods to your baby at around 6 months of age. BREAST MILK PUMPING Pumping and storing breast milk allows you to ensure that your baby is exclusively fed your breast milk, even at times when you are unable to breastfeed. This is especially important if you are going back to work while you are still breastfeeding or when you are not able to be present during feedings. Your lactation consultant can give you guidelines on how long it is safe to store breast milk.  A breast pump is a machine that allows you to pump milk from your breast into a sterile bottle. The pumped breast milk can then be stored in a refrigerator or freezer. Some breast pumps are operated by hand, while others use electricity. Ask  your lactation consultant which type will work best for you. Breast pumps can be purchased, but some hospitals and breastfeeding support groups lease breast pumps on a monthly basis. A lactation consultant can teach you how to hand express breast milk, if you prefer not to use a pump.  CARING FOR YOUR BREASTS WHILE YOU BREASTFEED Nipples can become dry, cracked, and sore while breastfeeding. The following recommendations can help keep your breasts moisturized and healthy:  Avoid using soap on your nipples.   Wear a supportive bra. Although not required, special nursing bras and tank tops are designed to allow access to your breasts for breastfeeding without taking off your entire bra or top. Avoid wearing underwire style bras or extremely tight bras.  Air dry your nipples for 3 4minutes after each feeding.   Use only cotton bra pads to absorb leaked breast milk. Leaking of breast milk between feedings is normal.   Use lanolin on your nipples after breastfeeding. Lanolin helps to maintain your skin's normal moisture barrier. If you use pure lanolin you do not need to wash it off before feeding your baby again. Pure lanolin is not toxic to your baby. You may also hand express a few drops of breast milk and gently massage that milk into your nipples and allow the milk to air dry. In the first few weeks after giving birth, some women   experience extremely full breasts (engorgement). Engorgement can make your breasts feel heavy, warm, and tender to the touch. Engorgement peaks within 3 5 days after you give birth. The following recommendations can help ease engorgement:  Completely empty your breasts while breastfeeding or pumping. You may want to start by applying warm, moist heat (in the shower or with warm water-soaked hand towels) just before feeding or pumping. This increases circulation and helps the milk flow. If your baby does not completely empty your breasts while breastfeeding, pump any extra  milk after he or she is finished.  Wear a snug bra (nursing or regular) or tank top for 1 2 days to signal your body to slightly decrease milk production.  Apply ice packs to your breasts, unless this is too uncomfortable for you.  Make sure that your baby is latched on and positioned properly while breastfeeding. If engorgement persists after 48 hours of following these recommendations, contact your health care provider or a lactation consultant. OVERALL HEALTH CARE RECOMMENDATIONS WHILE BREASTFEEDING  Eat healthy foods. Alternate between meals and snacks, eating 3 of each per day. Because what you eat affects your breast milk, some of the foods may make your baby more irritable than usual. Avoid eating these foods if you are sure that they are negatively affecting your baby.  Drink milk, fruit juice, and water to satisfy your thirst (about 10 glasses a day).   Rest often, relax, and continue to take your prenatal vitamins to prevent fatigue, stress, and anemia.  Continue breast self-awareness checks.  Avoid chewing and smoking tobacco.  Avoid alcohol and drug use. Some medicines that may be harmful to your baby can pass through breast milk. It is important to ask your health care provider before taking any medicine, including all over-the-counter and prescription medicine as well as vitamin and herbal supplements. It is possible to become pregnant while breastfeeding. If birth control is desired, ask your health care provider about options that will be safe for your baby. SEEK MEDICAL CARE IF:   You feel like you want to stop breastfeeding or have become frustrated with breastfeeding.  You have painful breasts or nipples.  Your nipples are cracked or bleeding.  Your breasts are red, tender, or warm.  You have a swollen area on either breast.  You have a fever or chills.  You have nausea or vomiting.  You have drainage other than breast milk from your nipples.  Your breasts  do not become full before feedings by the 5th day after you give birth.  You feel sad and depressed.  Your baby is too sleepy to eat well.  Your baby is having trouble sleeping.   Your baby is wetting less than 3 diapers in a 24-hour period.  Your baby has less than 3 stools in a 24-hour period.  Your baby's skin or the white part of his or her eyes becomes yellow.   Your baby is not gaining weight by 5 days of age. SEEK IMMEDIATE MEDICAL CARE IF:   Your baby is overly tired (lethargic) and does not want to wake up and feed.  Your baby develops an unexplained fever. Document Released: 10/19/2005 Document Revised: 06/21/2013 Document Reviewed: 04/12/2013 [employer] Patient Information 2014 [employer].  

## 2014-01-15 NOTE — Progress Notes (Signed)
Doing well--membranes could not be stripped TOC today for + GC Labor precautions  ---------------------------------

## 2014-01-15 NOTE — Progress Notes (Signed)
P= 96 C/o of intermittent lower abdominal/pelvic pressure and irregular contractions.  Mild edema in feet and hands.

## 2014-01-16 LAB — GC/CHLAMYDIA PROBE AMP
CT PROBE, AMP APTIMA: NEGATIVE
GC Probe RNA: NEGATIVE

## 2014-01-23 ENCOUNTER — Ambulatory Visit (INDEPENDENT_AMBULATORY_CARE_PROVIDER_SITE_OTHER): Payer: Medicaid Other | Admitting: Family Medicine

## 2014-01-23 VITALS — BP 135/75 | Temp 97.0°F | Wt 181.6 lb

## 2014-01-23 DIAGNOSIS — O0930 Supervision of pregnancy with insufficient antenatal care, unspecified trimester: Secondary | ICD-10-CM

## 2014-01-23 DIAGNOSIS — O0933 Supervision of pregnancy with insufficient antenatal care, third trimester: Secondary | ICD-10-CM

## 2014-01-23 LAB — POCT URINALYSIS DIP (DEVICE)
BILIRUBIN URINE: NEGATIVE
GLUCOSE, UA: NEGATIVE mg/dL
Hgb urine dipstick: NEGATIVE
KETONES UR: 15 mg/dL — AB
Nitrite: NEGATIVE
Protein, ur: NEGATIVE mg/dL
Specific Gravity, Urine: 1.015 (ref 1.005–1.030)
Urobilinogen, UA: 1 mg/dL (ref 0.0–1.0)
pH: 6.5 (ref 5.0–8.0)

## 2014-01-23 NOTE — Patient Instructions (Addendum)
Offices that do circumcisions:  [facility] [place]): [phone] $150 within 4 weeks of birth [facility] [phone] [place]) [phone] within 4 weeks of birth,  [facility] [phone] [place]) $250 within 7 days of birth,  [facility] [phone] [facility]) $175 within 2 weeks of birth Third Trimester of Pregnancy The third trimester is from week 29 through week 42, months 7 through 9. The third trimester is a time when the fetus is growing rapidly. At the end of the ninth month, the fetus is about 20 inches in length and weighs 6 10 pounds.  BODY CHANGES Your body goes through many changes during pregnancy. The changes vary from woman to woman.   Your weight will continue to increase. You can expect to gain 25 35 pounds (11 16 kg) by the end of the pregnancy.  You may begin to get stretch marks on your hips, abdomen, and breasts.  You may urinate more often because the fetus is moving lower into your pelvis and pressing on your bladder.  You may develop or continue to have heartburn as a result of your pregnancy.  You may develop constipation because certain hormones are causing the muscles that push waste through your intestines to slow down.  You may develop hemorrhoids or swollen, bulging veins (varicose veins).  You may have pelvic pain because of the weight gain and pregnancy hormones relaxing your joints between the bones in your pelvis. Back aches may result from over exertion of the muscles supporting your posture.  Your breasts will continue to grow and be tender. A yellow discharge may leak from your breasts called colostrum.  Your belly button may stick out.  You may feel short of breath because of your expanding uterus.  You may notice the fetus "dropping," or moving lower in your abdomen.  You may have a bloody mucus discharge. This usually occurs a few days to a week before labor begins.  Your cervix becomes thin and soft (effaced) near  your due date. WHAT TO EXPECT AT YOUR PRENATAL EXAMS  You will have prenatal exams every 2 weeks until week 36. Then, you will have weekly prenatal exams. During a routine prenatal visit:  You will be weighed to make sure you and the fetus are growing normally.  Your blood pressure is taken.  Your abdomen will be measured to track your baby's growth.  The fetal heartbeat will be listened to.  Any test results from the previous visit will be discussed.  You may have a cervical check near your due date to see if you have effaced. At around 36 weeks, your caregiver will check your cervix. At the same time, your caregiver will also perform a test on the secretions of the vaginal tissue. This test is to determine if a type of bacteria, Group B streptococcus, is present. Your caregiver will explain this further. Your caregiver may ask you:  What your birth plan is.  How you are feeling.  If you are feeling the baby move.  If you have had any abnormal symptoms, such as leaking fluid, bleeding, severe headaches, or abdominal cramping.  If you have any questions. Other tests or screenings that may be performed during your third trimester include:  Blood tests that check for low iron levels (anemia).  Fetal testing to check the health, activity level, and growth of the fetus. Testing is done if you have certain medical conditions or if there are problems during the pregnancy. FALSE LABOR You may feel small,  irregular contractions that eventually go away. These are called Braxton Hicks contractions, or false labor. Contractions may last for hours, days, or even weeks before true labor sets in. If contractions come at regular intervals, intensify, or become painful, it is best to be seen by your caregiver.  SIGNS OF LABOR   Menstrual-like cramps.  Contractions that are 5 minutes apart or less.  Contractions that start on the top of the uterus and spread down to the lower abdomen and  back.  A sense of increased pelvic pressure or back pain.  A watery or bloody mucus discharge that comes from the vagina. If you have any of these signs before the 37th week of pregnancy, call your caregiver right away. You need to go to the hospital to get checked immediately. HOME CARE INSTRUCTIONS   Avoid all smoking, herbs, alcohol, and unprescribed drugs. These chemicals affect the formation and growth of the baby.  Follow your caregiver's instructions regarding medicine use. There are medicines that are either safe or unsafe to take during pregnancy.  Exercise only as directed by your caregiver. Experiencing uterine cramps is a good sign to stop exercising.  Continue to eat regular, healthy meals.  Wear a good support bra for breast tenderness.  Do not use hot tubs, steam rooms, or saunas.  Wear your seat belt at all times when driving.  Avoid raw meat, uncooked cheese, cat litter boxes, and soil used by cats. These carry germs that can cause birth defects in the baby.  Take your prenatal vitamins.  Try taking a stool softener (if your caregiver approves) if you develop constipation. Eat more high-fiber foods, such as fresh vegetables or fruit and whole grains. Drink plenty of fluids to keep your urine clear or pale yellow.  Take warm sitz baths to soothe any pain or discomfort caused by hemorrhoids. Use hemorrhoid cream if your caregiver approves.  If you develop varicose veins, wear support hose. Elevate your feet for 15 minutes, 3 4 times a day. Limit salt in your diet.  Avoid heavy lifting, wear low heal shoes, and practice good posture.  Rest a lot with your legs elevated if you have leg cramps or low back pain.  Visit your dentist if you have not gone during your pregnancy. Use a soft toothbrush to brush your teeth and be gentle when you floss.  A sexual relationship may be continued unless your caregiver directs you otherwise.  Do not travel far distances unless  it is absolutely necessary and only with the approval of your caregiver.  Take prenatal classes to understand, practice, and ask questions about the labor and delivery.  Make a trial run to the hospital.  Pack your hospital bag.  Prepare the baby's nursery.  Continue to go to all your prenatal visits as directed by your caregiver. SEEK MEDICAL CARE IF:  You are unsure if you are in labor or if your water has broken.  You have dizziness.  You have mild pelvic cramps, pelvic pressure, or nagging pain in your abdominal area.  You have persistent nausea, vomiting, or diarrhea.  You have a bad smelling vaginal discharge.  You have pain with urination. SEEK IMMEDIATE MEDICAL CARE IF:   You have a fever.  You are leaking fluid from your vagina.  You have spotting or bleeding from your vagina.  You have severe abdominal cramping or pain.  You have rapid weight loss or gain.  You have shortness of breath with chest pain.  You notice  sudden or extreme swelling of your face, hands, ankles, feet, or legs.  You have not felt your baby move in over an hour.  You have severe headaches that do not go away with medicine.  You have vision changes. Document Released: 10/13/2001 Document Revised: 06/21/2013 Document Reviewed: 12/20/2012 [facility] Patient Information 2014 [place], [place].

## 2014-01-23 NOTE — Progress Notes (Signed)
P= 100 C/o of intermittent lower abdominal/pelvic pressure and irregular contractions.  Mild edema in hands and feet.

## 2014-01-23 NOTE — Progress Notes (Signed)
+  FM, no lof, no vb, +CTX - occassional.  Deanna Murray is a 23 y.o. G3P2002 at 6926w5d here for ROB visit.    TOC neg for GC/C 3/16  Unable to strip membranes  Discussed with Patient:  - Plans to breast feed.  All questions answered. - Continue prenatal vitamins. - Reviewed fetal kick counts Pt to perform daily at a time when the baby is active, lie laterally with both hands on belly in quiet room and count all movements (hiccups, shoulder rolls, obvious kicks, etc); pt is to report to clinic MAU for less than 10 movements felt in a 2 hour time period-pt told as soon as she counts 10 movements the count is complete.  - Routine precautions discussed (depression, infection s/s).   Patient provided with all pertinent phone numbers for emergencies. - RTC for any VB, regular, painful cramps/ctxs occurring at a rate of >2/10 min, fever (100.5 or higher), n/v/d, any pain that is unresolving or worsening, LOF, decreased fetal movement, CP, SOB, edema -RTC in one week for next visit.  Problems: Patient Active Problem List   Diagnosis Date Noted  . Group B Streptococcus carrier, +RV culture, currently pregnant 12/29/2013  . Iron deficiency anemia 12/27/2013  . Abnormal Pap smear 10/12/2013  . Gonorrhea complicating pregnancy in second trimester 10/12/2013  . Late prenatal care complicating pregnancy in third trimester 10/05/2013  . Supervision of normal subsequent pregnancy 09/13/2013  . BV (bacterial vaginosis) 09/13/2013    To Do: 1.

## 2014-01-26 ENCOUNTER — Inpatient Hospital Stay (HOSPITAL_COMMUNITY)
Admission: AD | Admit: 2014-01-26 | Discharge: 2014-01-26 | Disposition: A | Discharge status: Home / Self Care | Payer: Medicaid Other | Source: Ambulatory Visit | Attending: Obstetrics & Gynecology | Admitting: Obstetrics & Gynecology

## 2014-01-26 ENCOUNTER — Encounter (HOSPITAL_COMMUNITY): Payer: Self-pay | Admitting: *Deleted

## 2014-01-26 DIAGNOSIS — O479 False labor, unspecified: Secondary | ICD-10-CM | POA: Insufficient documentation

## 2014-01-26 DIAGNOSIS — O212 Late vomiting of pregnancy: Secondary | ICD-10-CM

## 2014-01-26 LAB — URINALYSIS, ROUTINE W REFLEX MICROSCOPIC
Bilirubin Urine: NEGATIVE
Glucose, UA: NEGATIVE mg/dL
Hgb urine dipstick: NEGATIVE
KETONES UR: NEGATIVE mg/dL
Nitrite: NEGATIVE
Protein, ur: NEGATIVE mg/dL
Urobilinogen, UA: 0.2 mg/dL (ref 0.0–1.0)
pH: 6 (ref 5.0–8.0)

## 2014-01-26 LAB — URINE MICROSCOPIC-ADD ON

## 2014-01-26 NOTE — MAU Note (Signed)
Vomiting & contractions. Vomited x 4 today, then started contracting after vomiting, every 5-7 minutes. Denies LOF or vaginal bleeding. Positive fetal movement.

## 2014-01-26 NOTE — MAU Provider Note (Signed)
First Provider Initiated Contact with Patient 01/26/14 0103      Chief Complaint:  Labor Eval and Emesis During Pregnancy   Deanna Murray is  23 y.o. [id number] at [redacted]w[redacted]d presents complaining of Labor Eval and Emesis During Pregnancy .  She states irregular, every 5-7 minutes contractions are associated with none vaginal bleeding, intact membranes, along with active fetal movement. She had a few episodes of vomiting that preceeded contractions, but feels fine now. She was 2 cms dilated on her last vaginal exam   Obstetrical/Gynecological History: OB History   Grav Para Term Preterm Abortions TAB SAB Ect Mult Living   3 2 2       2      Past Medical History: Past Medical History  Diagnosis Date  . Seizure   . Pregnancy     7 months    Past Surgical History: Past Surgical History  Procedure Laterality Date  . No past surgeries      Family History: Family History  Problem Relation Age of Onset  . Alcohol abuse Neg Hx   . Arthritis Neg Hx   . Asthma Neg Hx   . Birth defects Neg Hx   . Cancer Neg Hx   . COPD Neg Hx   . Depression Neg Hx   . Diabetes Neg Hx   . Drug abuse Neg Hx   . Early death Neg Hx   . Hearing loss Neg Hx   . Heart disease Neg Hx   . Hyperlipidemia Neg Hx   . Hypertension Neg Hx   . Kidney disease Neg Hx   . Learning disabilities Neg Hx   . Mental illness Neg Hx   . Mental retardation Neg Hx   . Miscarriages / Stillbirths Neg Hx   . Stroke Neg Hx   . Vision loss Neg Hx     Social History: History  Substance Use Topics  . Smoking status: Never Smoker   . Smokeless tobacco: Never Used  . Alcohol Use: No    Allergies: No Known Allergies  Meds:  Prescriptions prior to admission  Medication Sig Dispense Refill  . Prenatal Vit-Fe Fumarate-FA (PRENATAL MULTIVITAMIN) TABS tablet Take 1 tablet by mouth daily at 12 noon.        Review of Systems   Constitutional: Negative for fever and chills Eyes: Negative for visual  disturbances Respiratory: Negative for shortness of breath, dyspnea Cardiovascular: Negative for chest pain or palpitations  Gastrointestinal: Negative for diarrhea and constipation Genitourinary: Negative for dysuria and urgency Musculoskeletal: Negative for back pain, joint pain, myalgias  Neurological: Negative for dizziness and headaches     Physical Exam  Blood pressure 146/85, pulse 103, temperature 98.6 F (37 C), temperature source Oral, resp. rate 18, height 5\' 6"  (1.676 m), weight 83.643 kg (184 lb 6.4 oz), last menstrual period 05/29/2013, SpO2 99.00%. GENERAL: Well-developed, well-nourished female in no acute distress.  LUNGS: Clear to auscultation bilaterally.  HEART: Regular rate and rhythm. ABDOMEN: Soft, nontender, nondistended, gravid.  EXTREMITIES: Nontender, no edema, 2+ distal pulses. DTR's 2+ CERVICAL EXAM: Dilatation 2cm   Effacement 50%   Station -3, posterior   Presentation: cephalic FHT:  Baseline rate 140 bpm   Variability moderate  Accelerations present   Decelerations none Contractions: Every 7 mins   Labs: No results found for this or any previous visit (from the past 24 hour(s)). Imaging Studies:  No results found.  Assessment: Deanna Murray is  23 y.o. A5W0981 at [redacted]w[redacted]d presents with early  vs false labor.  Plan: D/C home; come back when/if her contractions increase in frequency/intensity.  CRESENZO-DISHMAN,Jovan Schickling 3/27/20151:07 AM

## 2014-01-26 NOTE — Discharge Instructions (Signed)

## 2014-01-28 ENCOUNTER — Encounter (HOSPITAL_COMMUNITY): Payer: Self-pay

## 2014-01-28 ENCOUNTER — Inpatient Hospital Stay (HOSPITAL_COMMUNITY)
Admission: AD | Admit: 2014-01-28 | Discharge: 2014-01-28 | Disposition: A | Discharge status: Home / Self Care | Payer: Medicaid Other | Source: Ambulatory Visit | Attending: Obstetrics & Gynecology | Admitting: Obstetrics & Gynecology

## 2014-01-28 DIAGNOSIS — N390 Urinary tract infection, site not specified: Secondary | ICD-10-CM | POA: Insufficient documentation

## 2014-01-28 DIAGNOSIS — R109 Unspecified abdominal pain: Secondary | ICD-10-CM | POA: Insufficient documentation

## 2014-01-28 DIAGNOSIS — O212 Late vomiting of pregnancy: Secondary | ICD-10-CM | POA: Insufficient documentation

## 2014-01-28 DIAGNOSIS — O2390 Unspecified genitourinary tract infection in pregnancy, unspecified trimester: Secondary | ICD-10-CM | POA: Insufficient documentation

## 2014-01-28 DIAGNOSIS — O479 False labor, unspecified: Secondary | ICD-10-CM | POA: Insufficient documentation

## 2014-01-28 LAB — URINALYSIS, ROUTINE W REFLEX MICROSCOPIC
BILIRUBIN URINE: NEGATIVE
Glucose, UA: NEGATIVE mg/dL
Hgb urine dipstick: NEGATIVE
Ketones, ur: NEGATIVE mg/dL
Nitrite: NEGATIVE
Protein, ur: NEGATIVE mg/dL
Specific Gravity, Urine: <1.005 — ABNORMAL LOW (ref 1.005–1.030)
Urobilinogen, UA: 0.2 mg/dL (ref 0.0–1.0)
pH: 6 (ref 5.0–8.0)

## 2014-01-28 LAB — URINE MICROSCOPIC-ADD ON

## 2014-01-28 MED ORDER — ONDANSETRON 4 MG PO TBDP
4.0000 mg | ORAL_TABLET | Freq: Once | ORAL | Status: AC
  Administered 2014-01-28: 4 mg via ORAL
  Filled 2014-01-28: qty 1

## 2014-01-28 MED ORDER — NITROFURANTOIN MONOHYD MACRO 100 MG PO CAPS: 100.0000 mg | ORAL_CAPSULE | Freq: Once | ORAL | Status: DC

## 2014-01-28 MED ORDER — ONDANSETRON 4 MG PO TBDP: 4.0000 mg | ORAL_TABLET | Freq: Once | ORAL | Status: DC

## 2014-01-28 MED ORDER — NITROFURANTOIN MONOHYD MACRO 100 MG PO CAPS
100.0000 mg | ORAL_CAPSULE | Freq: Once | ORAL | Status: AC
  Administered 2014-01-28: 100 mg via ORAL
  Filled 2014-01-28: qty 1

## 2014-01-28 NOTE — MAU Provider Note (Signed)
None     Chief Complaint:  No chief complaint on file.   Marijean Niemannenika R Haxton is  23 y.o. Z6X0960G3P2002 at 7054w3d presents complaining of lower abdominal pain and emesis 5 times today. She reports one episode of with a small amount of red in the emesis pt thought was blood. Pt denies light headedness, SOB, CP. Denies coffee ground emesis. Pt currently minimally nauseated.  Pt endorses painful lower uterine/abdomen pain that is constant but worse with contractions. Pt states they have been intensifying since being seen in MAU 2 days ago. Pt with frequent contractions, normal fetal movement, and no lof or vb.   Otherwise pt is in usual state of health. Denies urinary symptoms, bowel symptoms, f/c, ect.  Obstetrical/Gynecological History: OB History   Grav Para Term Preterm Abortions TAB SAB Ect Mult Living   3 2 2       2      Past Medical History: Past Medical History  Diagnosis Date  . Seizure   . Pregnancy     7 months    Past Surgical History: Past Surgical History  Procedure Laterality Date  . No past surgeries      Family History: Family History  Problem Relation Age of Onset  . Alcohol abuse Neg Hx   . Arthritis Neg Hx   . Asthma Neg Hx   . Birth defects Neg Hx   . Cancer Neg Hx   . COPD Neg Hx   . Depression Neg Hx   . Diabetes Neg Hx   . Drug abuse Neg Hx   . Early death Neg Hx   . Hearing loss Neg Hx   . Heart disease Neg Hx   . Hyperlipidemia Neg Hx   . Hypertension Neg Hx   . Kidney disease Neg Hx   . Learning disabilities Neg Hx   . Mental illness Neg Hx   . Mental retardation Neg Hx   . Miscarriages / Stillbirths Neg Hx   . Stroke Neg Hx   . Vision loss Neg Hx     Social History: History  Substance Use Topics  . Smoking status: Never Smoker   . Smokeless tobacco: Never Used  . Alcohol Use: No    Allergies: No Known Allergies  Meds:  Prescriptions prior to admission  Medication Sig Dispense Refill  . Prenatal Vit-Fe Fumarate-FA (PRENATAL  MULTIVITAMIN) TABS tablet Take 1 tablet by mouth daily at 12 noon.        Review of Systems -   Review of Systems  See above   Physical Exam  Blood pressure 139/79, pulse 89, temperature 98.2 F (36.8 C), temperature source Oral, resp. rate 18, height 5\' 4"  (1.626 m), weight 85.843 kg (189 lb 4 oz), last menstrual period 05/29/2013. GENERAL: Well-developed, well-nourished female in no acute distress.  LUNGS: Clear to auscultation bilaterally.  HEART: Regular rate and rhythm. ABDOMEN: Soft, nontender, nondistended, gravid. No CVAT EXTREMITIES: Nontender, no edema, 2+ distal pulses. DTR's 2+ Dilation: 3 Effacement (%): 60 Station: -2 Presentation: Vertex Exam by:: Dr Ike Benedom  FHT:  Baseline rate 120s bpm   Variability moderate  Accelerations present   Decelerations none Contractions: Every 3-7 mins   Labs: No results found for this or any previous visit (from the past 24 hour(s)). Imaging Studies:  No results found.  Assessment: Marijean Niemannenika R Hara is  23 y.o. A5W0981G3P2002 at 754w3d presents with nausea and vomiting likely related to early labor complicated by a UTI. - pt given macrobid in ED  and will give #13 additional to complete 7d course - Zofran in ED improved mild nausea, given Rx for home - pt to return for worsening labor symptoms - pt to return for inability to tolerate PO or worsening emesis or recurrent blood. Pt instructed to take photo of any recurrent emesis for review.  Pt states understanding and will follow up in MAU or clinic depending on symptoms  Ariell Gunnels RYAN 3/29/20154:07 AM

## 2014-01-28 NOTE — MAU Note (Addendum)
Pt said she is having pressure between her legs for last 2 days, pressure worse with urination.  Can't sleep well due to low back pain.  Vomited earlier and saw some blood.  Said she just burped and food comes back up.  Reports some contractions but pressure in low pelvis is worse.  Denies any vaginal bleeding or leaking.  Baby moving well.  Was 2 cm last visit.

## 2014-01-28 NOTE — MAU Note (Signed)
PT SAYS SHE STARTED HURTING    BAD AT 530PM.      VE IN CLINIC  2 CM  3-24.   DENIES HSV AND MRSA.

## 2014-01-29 NOTE — MAU Provider Note (Signed)
Attestation of Attending Supervision of Obstetric Fellow: Evaluation and management procedures were performed by the Obstetric Fellow under my supervision and collaboration.  I have reviewed the Obstetric Fellow's note and chart, and I agree with the management and plan.  Annaliese Saez, MD, FACOG Attending Obstetrician & Gynecologist Faculty Practice, [facility]   

## 2014-01-30 ENCOUNTER — Ambulatory Visit (INDEPENDENT_AMBULATORY_CARE_PROVIDER_SITE_OTHER): Payer: Medicaid Other | Admitting: Advanced Practice Midwife

## 2014-01-30 VITALS — BP 130/76 | Temp 97.5°F | Wt 191.7 lb

## 2014-01-30 DIAGNOSIS — O480 Post-term pregnancy: Secondary | ICD-10-CM

## 2014-01-30 LAB — US OB FOLLOW UP

## 2014-01-30 LAB — POCT URINALYSIS DIP (DEVICE)
BILIRUBIN URINE: NEGATIVE
Glucose, UA: NEGATIVE mg/dL
Hgb urine dipstick: NEGATIVE
Nitrite: NEGATIVE
PH: 6 (ref 5.0–8.0)
PROTEIN: NEGATIVE mg/dL
Specific Gravity, Urine: 1.02 (ref 1.005–1.030)
UROBILINOGEN UA: 1 mg/dL (ref 0.0–1.0)

## 2014-01-30 NOTE — Progress Notes (Signed)
Doing well.  Good fetal movement, denies vaginal bleeding, LOF, regular contractions.  IOL scheduled for Friday (41 weeks). Membranes swept in office today.

## 2014-01-30 NOTE — Progress Notes (Signed)
P = 78   10lb weight gain in 1 week.

## 2014-01-31 ENCOUNTER — Encounter (HOSPITAL_COMMUNITY): Payer: Self-pay | Admitting: Obstetrics

## 2014-01-31 ENCOUNTER — Inpatient Hospital Stay (HOSPITAL_COMMUNITY)
Admission: AD | Admit: 2014-01-31 | Discharge: 2014-02-02 | DRG: 774 | Disposition: A | Discharge status: Home / Self Care | Payer: Medicaid Other | Source: Ambulatory Visit | Attending: Obstetrics & Gynecology | Admitting: Obstetrics & Gynecology

## 2014-01-31 ENCOUNTER — Telehealth (HOSPITAL_COMMUNITY): Payer: Self-pay | Admitting: *Deleted

## 2014-01-31 DIAGNOSIS — G40909 Epilepsy, unspecified, not intractable, without status epilepticus: Secondary | ICD-10-CM | POA: Diagnosis present

## 2014-01-31 DIAGNOSIS — O99354 Diseases of the nervous system complicating childbirth: Secondary | ICD-10-CM | POA: Diagnosis present

## 2014-01-31 DIAGNOSIS — IMO0001 Reserved for inherently not codable concepts without codable children: Secondary | ICD-10-CM

## 2014-01-31 DIAGNOSIS — O9989 Other specified diseases and conditions complicating pregnancy, childbirth and the puerperium: Secondary | ICD-10-CM

## 2014-01-31 DIAGNOSIS — O99892 Other specified diseases and conditions complicating childbirth: Secondary | ICD-10-CM | POA: Diagnosis present

## 2014-01-31 DIAGNOSIS — O0930 Supervision of pregnancy with insufficient antenatal care, unspecified trimester: Secondary | ICD-10-CM

## 2014-01-31 DIAGNOSIS — O9902 Anemia complicating childbirth: Secondary | ICD-10-CM | POA: Diagnosis present

## 2014-01-31 DIAGNOSIS — IMO0002 Reserved for concepts with insufficient information to code with codable children: Principal | ICD-10-CM | POA: Diagnosis not present

## 2014-01-31 DIAGNOSIS — Z22330 Carrier of Group B streptococcus: Secondary | ICD-10-CM

## 2014-01-31 DIAGNOSIS — D649 Anemia, unspecified: Secondary | ICD-10-CM | POA: Diagnosis present

## 2014-01-31 HISTORY — DX: Anemia, unspecified: D64.9

## 2014-01-31 MED ORDER — OXYCODONE-ACETAMINOPHEN 5-325 MG PO TABS
1.0000 | ORAL_TABLET | ORAL | Status: DC | PRN
  Filled 2014-01-31: qty 1
  Filled 2014-01-31 (×2): qty 2

## 2014-01-31 MED ORDER — LACTATED RINGERS IV SOLN
500.0000 mL | Freq: Once | INTRAVENOUS | Status: AC
  Administered 2014-02-01: 500 mL via INTRAVENOUS

## 2014-01-31 MED ORDER — SODIUM CHLORIDE 0.9 % IV SOLN
2.0000 g | Freq: Once | INTRAVENOUS | Status: AC
  Administered 2014-01-31: 2 g via INTRAVENOUS
  Filled 2014-01-31: qty 2000

## 2014-01-31 MED ORDER — ACETAMINOPHEN 325 MG PO TABS: 650.0000 mg | ORAL_TABLET | ORAL | Status: DC | PRN

## 2014-01-31 MED ORDER — LIDOCAINE HCL (PF) 1 % IJ SOLN
30.0000 mL | INTRAMUSCULAR | Status: DC | PRN
  Filled 2014-01-31: qty 30

## 2014-01-31 MED ORDER — IBUPROFEN 600 MG PO TABS
600.0000 mg | ORAL_TABLET | Freq: Four times a day (QID) | ORAL | Status: DC | PRN
  Administered 2014-02-01: 600 mg via ORAL
  Filled 2014-01-31: qty 1

## 2014-01-31 MED ORDER — PHENYLEPHRINE 40 MCG/ML (10ML) SYRINGE FOR IV PUSH (FOR BLOOD PRESSURE SUPPORT)
80.0000 ug | PREFILLED_SYRINGE | INTRAVENOUS | Status: DC | PRN
  Filled 2014-01-31: qty 2

## 2014-01-31 MED ORDER — OXYTOCIN BOLUS FROM INFUSION
500.0000 mL | INTRAVENOUS | Status: DC
  Administered 2014-02-01: 500 mL via INTRAVENOUS

## 2014-01-31 MED ORDER — ONDANSETRON HCL 4 MG/2ML IJ SOLN: 4.0000 mg | Freq: Four times a day (QID) | INTRAMUSCULAR | Status: DC | PRN

## 2014-01-31 MED ORDER — LACTATED RINGERS IV SOLN: 500.0000 mL | INTRAVENOUS | Status: DC | PRN

## 2014-01-31 MED ORDER — PHENYLEPHRINE 40 MCG/ML (10ML) SYRINGE FOR IV PUSH (FOR BLOOD PRESSURE SUPPORT)
80.0000 ug | PREFILLED_SYRINGE | INTRAVENOUS | Status: DC | PRN
  Filled 2014-01-31: qty 10
  Filled 2014-01-31: qty 2

## 2014-01-31 MED ORDER — DIPHENHYDRAMINE HCL 50 MG/ML IJ SOLN: 12.5000 mg | INTRAMUSCULAR | Status: DC | PRN

## 2014-01-31 MED ORDER — EPHEDRINE 5 MG/ML INJ
10.0000 mg | INTRAVENOUS | Status: DC | PRN
  Filled 2014-01-31: qty 2

## 2014-01-31 MED ORDER — OXYTOCIN 40 UNITS IN LACTATED RINGERS INFUSION - SIMPLE MED
62.5000 mL/h | INTRAVENOUS | Status: DC
  Filled 2014-01-31 (×2): qty 1000

## 2014-01-31 MED ORDER — LACTATED RINGERS IV SOLN
INTRAVENOUS | Status: DC
Start: 2014-01-31 — End: 2014-02-02
  Administered 2014-01-31 – 2014-02-01 (×2): via INTRAVENOUS

## 2014-01-31 MED ORDER — CITRIC ACID-SODIUM CITRATE 334-500 MG/5ML PO SOLN: 30.0000 mL | ORAL | Status: DC | PRN

## 2014-01-31 MED ORDER — FENTANYL 2.5 MCG/ML BUPIVACAINE 1/10 % EPIDURAL INFUSION (WH - ANES)
14.0000 mL/h | INTRAMUSCULAR | Status: DC | PRN
  Administered 2014-02-01: 14 mL/h via EPIDURAL
  Filled 2014-01-31: qty 125

## 2014-01-31 MED ORDER — EPHEDRINE 5 MG/ML INJ
10.0000 mg | INTRAVENOUS | Status: DC | PRN
  Filled 2014-01-31: qty 2
  Filled 2014-01-31: qty 4

## 2014-01-31 NOTE — H&P (Signed)
HPI: Deanna Murray is a 23 y.o. year old 473P2002 female at 5379w6d weeks gestation who presents to MAU reporting Labor since 1930. Denies LOF. Pos bloody show.  [facility]  [facility]   Anatomic US  Normal features  Glucose Screen  neg 3hr 81/129/118/112  GBS   Feeding Preference  Undecided  Contraception  Possibly BTL  Circumcision  desires as outpatient.  TDaP 11/01/13  Patient Active Problem List   Diagnosis Date Noted  . Group B Streptococcus carrier, +RV culture, currently pregnant 12/29/2013  . Iron deficiency anemia 12/27/2013  . Abnormal Pap smear 10/12/2013  . Gonorrhea complicating pregnancy in second trimester 10/12/2013  . Late prenatal care complicating pregnancy in third trimester 10/05/2013  . Supervision of normal subsequent pregnancy 09/13/2013  . BV (bacterial vaginosis) 09/13/2013    History OB History   Grav Para Term Preterm Abortions TAB SAB Ect Mult Living   3 2 2       2      Past Medical History  Diagnosis Date  . Seizure   . Pregnancy     7 months   Past Surgical History  Procedure Laterality Date  . No past surgeries     Family History: family history is negative for Alcohol abuse, Arthritis, Asthma, Birth defects, Cancer, COPD, Depression, Diabetes, Drug abuse, Early death, Hearing loss, Heart disease, Hyperlipidemia, Hypertension, Kidney disease, Learning disabilities, Mental illness, Mental retardation, Miscarriages / Stillbirths, Stroke, and Vision loss. Social History:  reports that she has never smoked. She has never used smokeless tobacco. She reports that she does not drink alcohol or use illicit drugs.   Review of Systems  Constitutional: Negative for fever and chills.  Eyes: Negative for blurred vision.  Respiratory: Negative for sputum production.   Cardiovascular: Negative for chest pain.  Gastrointestinal: Positive for abdominal pain.  Neurological: Negative for headaches.      Last menstrual period  05/29/2013. Maternal Exam:  Uterine Assessment: Contraction strength is firm.  Contraction frequency is regular.   Abdomen: Patient reports no abdominal tenderness. Fundal height is S=D.   Fetal presentation: vertex  Introitus: Normal vulva. Pelvis: adequate for delivery.   Cervix: Cervix evaluated by digital exam.     Fetal Exam Fetal Monitor Review: Mode: ultrasound.   Baseline rate: 120.      Physical Exam  Nursing note and vitals reviewed. Constitutional: She is oriented to person, place, and time. She appears well-developed and well-nourished. She appears distressed.  HENT:  Head: Normocephalic.  Eyes: Conjunctivae are normal.  Cardiovascular: Normal rate and regular rhythm.   Respiratory: Effort normal and breath sounds normal.  GI: Soft.  Musculoskeletal: She exhibits edema.  Neurological: She is alert and oriented to person, place, and time. She has normal reflexes.  Skin: Skin is warm and dry.  Psychiatric: She has a normal mood and affect.    VE 8 cm  Prenatal labs: ABO, Rh: O/POS/-- (12/04 1414) Antibody: NEG (12/04 1414) Rubella: 1.87 (12/04 1414) RPR: NON REAC (12/31 1152)  HBsAg: NEGATIVE (12/04 1414)  HIV: NON REACTIVE (12/31 1152)  GBS: Positive (02/25 0000)  1 hour GTT 142 3 hour normal  Assessment: 1. Labor: transition 2. Fetal Wellbeing: Category I  3. Pain Control: none 4. GBS: Positive 5. 40.6 week IUP  Plan:  1. Admit to BS per consult with MD 2. Routine L&D orders 3. Analgesia/anesthesia PRN  4. GBS + - Ampicillin    Deanna Murray 01/31/2014, 11:17 PM

## 2014-01-31 NOTE — Telephone Encounter (Signed)
Preadmission screen  

## 2014-01-31 NOTE — MAU Note (Signed)
PT BROUGHT FROM LOBBY  WITH W/C TO RM 7.  GETS PNC   IN CLINIC.  VE YESTERDAY-  4 CM.    DENIES HSV AND MRSA.

## 2014-02-01 ENCOUNTER — Inpatient Hospital Stay (HOSPITAL_COMMUNITY): Payer: Medicaid Other | Admitting: Anesthesiology

## 2014-02-01 ENCOUNTER — Encounter (HOSPITAL_COMMUNITY): Payer: Self-pay | Admitting: Obstetrics

## 2014-02-01 ENCOUNTER — Encounter (HOSPITAL_COMMUNITY): Payer: Medicaid Other | Admitting: Anesthesiology

## 2014-02-01 DIAGNOSIS — O99354 Diseases of the nervous system complicating childbirth: Secondary | ICD-10-CM

## 2014-02-01 DIAGNOSIS — O99892 Other specified diseases and conditions complicating childbirth: Secondary | ICD-10-CM

## 2014-02-01 DIAGNOSIS — O9989 Other specified diseases and conditions complicating pregnancy, childbirth and the puerperium: Secondary | ICD-10-CM

## 2014-02-01 DIAGNOSIS — G40909 Epilepsy, unspecified, not intractable, without status epilepticus: Secondary | ICD-10-CM

## 2014-02-01 DIAGNOSIS — IMO0002 Reserved for concepts with insufficient information to code with codable children: Secondary | ICD-10-CM

## 2014-02-01 LAB — CBC
HCT: 25.2 % — ABNORMAL LOW (ref 36.0–46.0)
HCT: 25.8 % — ABNORMAL LOW (ref 36.0–46.0)
HEMATOCRIT: 23.5 % — AB (ref 36.0–46.0)
HEMOGLOBIN: 7.2 g/dL — AB (ref 12.0–15.0)
Hemoglobin: 7.6 g/dL — ABNORMAL LOW (ref 12.0–15.0)
Hemoglobin: 7.9 g/dL — ABNORMAL LOW (ref 12.0–15.0)
MCH: 22.3 pg — ABNORMAL LOW (ref 26.0–34.0)
MCH: 22.5 pg — ABNORMAL LOW (ref 26.0–34.0)
MCH: 22.5 pg — ABNORMAL LOW (ref 26.0–34.0)
MCHC: 30.2 g/dL (ref 30.0–36.0)
MCHC: 30.6 g/dL (ref 30.0–36.0)
MCHC: 30.6 g/dL (ref 30.0–36.0)
MCV: 73.9 fL — ABNORMAL LOW (ref 78.0–100.0)
MCV: 73.4 fL — ABNORMAL LOW (ref 78.0–100.0)
MCV: 73.5 fL — AB (ref 78.0–100.0)
PLATELETS: 252 10*3/uL (ref 150–400)
Platelets: 226 10*3/uL (ref 150–400)
Platelets: 264 10*3/uL (ref 150–400)
RBC: 3.41 MIL/uL — AB (ref 3.87–5.11)
RBC: 3.2 MIL/uL — ABNORMAL LOW (ref 3.87–5.11)
RBC: 3.51 MIL/uL — AB (ref 3.87–5.11)
RDW: 16.6 % — ABNORMAL HIGH (ref 11.5–15.5)
RDW: 16.5 % — ABNORMAL HIGH (ref 11.5–15.5)
RDW: 16.6 % — AB (ref 11.5–15.5)
WBC: 15.7 10*3/uL — AB (ref 4.0–10.5)
WBC: 15.3 10*3/uL — ABNORMAL HIGH (ref 4.0–10.5)
WBC: 10.5 10*3/uL (ref 4.0–10.5)

## 2014-02-01 LAB — COMPREHENSIVE METABOLIC PANEL
ALBUMIN: 2.4 g/dL — AB (ref 3.5–5.2)
ALT: 15 U/L (ref 0–35)
AST: 26 U/L (ref 0–37)
Alkaline Phosphatase: 109 U/L (ref 39–117)
BUN: 8 mg/dL (ref 6–23)
CO2: 20 mEq/L (ref 19–32)
CREATININE: 0.67 mg/dL (ref 0.50–1.10)
Calcium: 8.7 mg/dL (ref 8.4–10.5)
Chloride: 103 mEq/L (ref 96–112)
GFR calc Af Amer: >90 mL/min (ref >90)
GFR calc non Af Amer: >90 mL/min (ref >90)
Glucose, Bld: 102 mg/dL — ABNORMAL HIGH (ref 70–99)
Potassium: 4.1 mEq/L (ref 3.7–5.3)
Sodium: 137 mEq/L (ref 137–147)
TOTAL PROTEIN: 6.1 g/dL (ref 6.0–8.3)
Total Bilirubin: 0.4 mg/dL (ref 0.3–1.2)

## 2014-02-01 LAB — TYPE AND SCREEN
ABO/RH(D): O POS
Antibody Screen: NEGATIVE

## 2014-02-01 LAB — PROTEIN / CREATININE RATIO, URINE
CREATININE, URINE: 99.38 mg/dL
Protein Creatinine Ratio: 0.2 — ABNORMAL HIGH (ref 0.00–0.15)
TOTAL PROTEIN, URINE: 19.7 mg/dL

## 2014-02-01 LAB — RPR: RPR Ser Ql: NONREACTIVE

## 2014-02-01 LAB — ABO/RH: ABO/RH(D): O POS

## 2014-02-01 MED ORDER — SENNOSIDES-DOCUSATE SODIUM 8.6-50 MG PO TABS
2.0000 | ORAL_TABLET | ORAL | Status: DC
  Administered 2014-02-02: 2 via ORAL
  Filled 2014-02-01: qty 2

## 2014-02-01 MED ORDER — MISOPROSTOL 200 MCG PO TABS
1000.0000 ug | ORAL_TABLET | Freq: Once | ORAL | Status: AC
  Administered 2014-02-01: 1000 ug via RECTAL

## 2014-02-01 MED ORDER — OXYTOCIN 40 UNITS IN LACTATED RINGERS INFUSION - SIMPLE MED: 62.5000 mL/h | INTRAVENOUS | Status: DC

## 2014-02-01 MED ORDER — TETANUS-DIPHTH-ACELL PERTUSSIS 5-2.5-18.5 LF-MCG/0.5 IM SUSP
0.5000 mL | Freq: Once | INTRAMUSCULAR | Status: AC
  Administered 2014-02-02: 0.5 mL via INTRAMUSCULAR
  Filled 2014-02-01: qty 0.5

## 2014-02-01 MED ORDER — OXYCODONE-ACETAMINOPHEN 5-325 MG PO TABS
1.0000 | ORAL_TABLET | ORAL | Status: DC | PRN
  Administered 2014-02-01: 2 via ORAL
  Administered 2014-02-01: 1 via ORAL
  Administered 2014-02-01: 2 via ORAL

## 2014-02-01 MED ORDER — LIDOCAINE HCL (PF) 1 % IJ SOLN
INTRAMUSCULAR | Status: DC | PRN
  Administered 2014-02-01 (×4): 4 mL

## 2014-02-01 MED ORDER — DIBUCAINE 1 % RE OINT: 1.0000 "application " | TOPICAL_OINTMENT | RECTAL | Status: DC | PRN

## 2014-02-01 MED ORDER — ZOLPIDEM TARTRATE 5 MG PO TABS: 5.0000 mg | ORAL_TABLET | Freq: Every evening | ORAL | Status: DC | PRN

## 2014-02-01 MED ORDER — PRENATAL MULTIVITAMIN CH
1.0000 | ORAL_TABLET | Freq: Every day | ORAL | Status: DC
  Administered 2014-02-01 – 2014-02-02 (×2): 1 via ORAL
  Filled 2014-02-01 (×2): qty 1

## 2014-02-01 MED ORDER — WITCH HAZEL-GLYCERIN EX PADS: 1.0000 "application " | MEDICATED_PAD | CUTANEOUS | Status: DC | PRN

## 2014-02-01 MED ORDER — FENTANYL CITRATE 0.05 MG/ML IJ SOLN
100.0000 ug | Freq: Once | INTRAMUSCULAR | Status: AC
  Administered 2014-02-01: 100 ug via INTRAVENOUS

## 2014-02-01 MED ORDER — LANOLIN HYDROUS EX OINT: TOPICAL_OINTMENT | CUTANEOUS | Status: DC | PRN

## 2014-02-01 MED ORDER — SIMETHICONE 80 MG PO CHEW: 80.0000 mg | CHEWABLE_TABLET | ORAL | Status: DC | PRN

## 2014-02-01 MED ORDER — FENTANYL CITRATE 0.05 MG/ML IJ SOLN
INTRAMUSCULAR | Status: AC
  Filled 2014-02-01: qty 2

## 2014-02-01 MED ORDER — MISOPROSTOL 200 MCG PO TABS
ORAL_TABLET | ORAL | Status: AC
  Administered 2014-02-01: 1000 ug via RECTAL
  Filled 2014-02-01: qty 5

## 2014-02-01 MED ORDER — DIPHENHYDRAMINE HCL 25 MG PO CAPS: 25.0000 mg | ORAL_CAPSULE | Freq: Four times a day (QID) | ORAL | Status: DC | PRN

## 2014-02-01 MED ORDER — ONDANSETRON HCL 4 MG/2ML IJ SOLN: 4.0000 mg | INTRAMUSCULAR | Status: DC | PRN

## 2014-02-01 MED ORDER — BENZOCAINE-MENTHOL 20-0.5 % EX AERO: 1.0000 "application " | INHALATION_SPRAY | CUTANEOUS | Status: DC | PRN

## 2014-02-01 MED ORDER — ONDANSETRON HCL 4 MG PO TABS: 4.0000 mg | ORAL_TABLET | ORAL | Status: DC | PRN

## 2014-02-01 MED ORDER — IBUPROFEN 600 MG PO TABS
600.0000 mg | ORAL_TABLET | Freq: Four times a day (QID) | ORAL | Status: DC
  Administered 2014-02-01 – 2014-02-02 (×5): 600 mg via ORAL
  Filled 2014-02-01 (×6): qty 1

## 2014-02-01 NOTE — Anesthesia Postprocedure Evaluation (Signed)
  Anesthesia Post Note  Patient: Deanna Murray  Procedure(s) Performed: * No procedures listed *  Anesthesia type: Epidural  Patient location: Mother/Baby  Post pain: Pain level controlled  Post assessment: Post-op Vital signs reviewed  Last Vitals:  Filed Vitals:   02/01/14 1406  BP: 135/78  Pulse: 81  Temp: 36.8 C  Resp: 20    Post vital signs: Reviewed  Level of consciousness:alert  Complications: No apparent anesthesia complications

## 2014-02-01 NOTE — Progress Notes (Signed)
I was present for the exam and agree with above.  [place] Tineka Uriegas, CNM 02/01/2014 3:05 AM

## 2014-02-01 NOTE — Anesthesia Procedure Notes (Signed)
Epidural Patient location during procedure: OB Start time: 02/01/2014 12:23 AM  Staffing Performed by: anesthesiologist   Preanesthetic Checklist Completed: patient identified, site marked, surgical consent, pre-op evaluation, timeout performed, IV checked, risks and benefits discussed and monitors and equipment checked  Epidural Patient position: sitting Prep: site prepped and draped and DuraPrep Patient monitoring: continuous pulse ox and blood pressure Approach: midline Injection technique: LOR air  Needle:  Needle type: Tuohy  Needle gauge: 17 G Needle length: 9 cm and 9 Needle insertion depth: 5.5 cm Catheter type: closed end flexible Catheter size: 19 Gauge Catheter at skin depth: 10.5 cm Test dose: negative  Assessment Events: blood not aspirated, injection not painful, no injection resistance, negative IV test and no paresthesia  Additional Notes Discussed risk of headache, infection, bleeding, nerve injury and failed or incomplete block.  Patient voices understanding and wishes to proceed.  Epidural placed easily on first attempt.  No paresthesia.  Patient tolerated procedure well with no apparent complications.  Jasmine DecemberA. Nianna Igo, MDReason for block:procedure for pain

## 2014-02-01 NOTE — H&P (Signed)
Attestation of Attending Supervision of Advanced Practitioner (PA/CNM/NP): Evaluation and management procedures were performed by the Advanced Practitioner under my supervision and collaboration.  I have reviewed the Advanced Practitioner's note and chart, and I agree with the management and plan.  Jandiel Magallanes, MD, FACOG Attending Obstetrician & Gynecologist Faculty Practice, [facility]  

## 2014-02-01 NOTE — Anesthesia Preprocedure Evaluation (Signed)
Anesthesia Evaluation  Patient identified by MRN, date of birth, ID band Patient awake    Reviewed: Allergy & Precautions, H&P , NPO status , Patient's Chart, lab work & pertinent test results, reviewed documented beta blocker date and time   History of Anesthesia Complications Negative for: history of anesthetic complications  Airway Mallampati: III TM Distance: >3 FB Neck ROM: full    Dental  (+) Teeth Intact   Pulmonary neg pulmonary ROS,  breath sounds clear to auscultation        Cardiovascular negative cardio ROS  Rhythm:regular Rate:Normal     Neuro/Psych Seizures - (none since age 23, off medications for 10 years),  Occasional syncopal episodes - last 1 year ago negative psych ROS   GI/Hepatic negative GI ROS, Neg liver ROS,   Endo/Other  negative endocrine ROS  Renal/GU negative Renal ROS  negative genitourinary   Musculoskeletal   Abdominal   Peds  Hematology  (+) anemia , hgb 7.9   Anesthesia Other Findings   Reproductive/Obstetrics (+) Pregnancy                           Anesthesia Physical Anesthesia Plan  ASA: II  Anesthesia Plan: Epidural   Post-op Pain Management:    Induction:   Airway Management Planned:   Additional Equipment:   Intra-op Plan:   Post-operative Plan:   Informed Consent: I have reviewed the patients History and Physical, chart, labs and discussed the procedure including the risks, benefits and alternatives for the proposed anesthesia with the patient or authorized representative who has indicated his/her understanding and acceptance.     Plan Discussed with:   Anesthesia Plan Comments:         Anesthesia Quick Evaluation

## 2014-02-01 NOTE — Progress Notes (Signed)
Marijean Niemannenika R Alcivar is a 23 y.o. [id number] at 5921w0d admitted for active labor.  Subjective: Still having painful contractions. Epidural placed.   Objective: BP 109/49  Pulse 80  Temp(Src) 97.9 F (36.6 C) (Oral)  Resp 20  Ht 5\' 4"  (1.626 m)  Wt 86.637 kg (191 lb)  BMI 32.77 kg/m2  SpO2 100%  LMP 05/29/2013     FHT:  FHR: 120 bpm, variability: moderate,  accelerations:  Present,  decelerations:  Absent UC:   regular, every 2-3 minutes SVE:   Dilation: 8 Effacement (%): 90 Station: -3 Exam by:: Everlene OtherJayce Hayes Czaja DO  Labs: Lab Results  Component Value Date   WBC 10.5 01/31/2014   HGB 7.9* 01/31/2014   HCT 25.8* 01/31/2014   MCV 73.5* 01/31/2014   PLT 264 01/31/2014    Assessment / Plan: Spontaneous labor, progressing normally  Labor: Progressing normally Fetal Wellbeing:  Category I Pain Control:  Epidural  ID:  GBS +; Ampicillin Anticipated MOD:  NSVD  Everlene OtherCook, Doreene Forrey 02/01/2014, 1:29 AM

## 2014-02-01 NOTE — Progress Notes (Addendum)
Patient ID: Deanna Murray, female   DOB: Aug 02, 1991, 23 y.o.   MRN: [id number] Called to room to assess bleeding. Pt's bleeding was initially very minimal, then passed some moderate clots. Firm w/ massage. Cytotec 1000 mcg placed PR. I&O cath. Fundal height rising. Passed a few more clots w/ continuous trickle of blood. Pit bolus given. CNM Swept LUS and removed 2 moderate clots. Some clot still felt, but CNM unable to remove. Uterus firm. Bleeding scant. Pit bolus. Empty bladder again (has been 1 hour since cath.) CTO closely. Total EBL ~550. Pt well-appearing. VSS.   BP 142/82  Pulse 82  Temp(Src) 99.6 F (37.6 C) (Oral)  Resp 20   Results for orders placed during the hospital encounter of 01/31/14 (from the past 24 hour(s))  CBC     Status: Abnormal   Collection Time    01/31/14 11:35 PM      Result Value Ref Range   WBC 10.5  4.0 - 10.5 K/uL   RBC 3.51 (*) 3.87 - 5.11 MIL/uL   Hemoglobin 7.9 (*) 12.0 - 15.0 g/dL   HCT 40.925.8 (*) 81.136.0 - 91.446.0 %   MCV 73.5 (*) 78.0 - 100.0 fL   MCH 22.5 (*) 26.0 - 34.0 pg   MCHC 30.6  30.0 - 36.0 g/dL   RDW 78.216.6 (*) 95.611.5 - 21.315.5 %   Platelets 264  150 - 400 K/uL  RPR     Status: None   Collection Time    01/31/14 11:35 PM      Result Value Ref Range   RPR NON REACTIVE  NON REACTIVE  TYPE AND SCREEN     Status: None   Collection Time    01/31/14 11:35 PM      Result Value Ref Range   ABO/RH(D) O POS     Antibody Screen NEG     Sample Expiration 02/03/2014    ABO/RH     Status: None   Collection Time    01/31/14 11:35 PM      Result Value Ref Range   ABO/RH(D) O POS    PROTEIN / CREATININE RATIO, URINE     Status: Abnormal   Collection Time    02/01/14  4:50 AM      Result Value Ref Range   Creatinine, Urine 99.38     Total Protein, Urine 19.7     PROTEIN CREATININE RATIO 0.20 (*) 0.00 - 0.15  COMPREHENSIVE METABOLIC PANEL     Status: Abnormal   Collection Time    02/01/14  5:20 AM      Result Value Ref Range   Sodium 137  137 - 147  mEq/L   Potassium 4.1  3.7 - 5.3 mEq/L   Chloride 103  96 - 112 mEq/L   CO2 20  19 - 32 mEq/L   Glucose, Bld 102 (*) 70 - 99 mg/dL   BUN 8  6 - 23 mg/dL   Creatinine, Ser 0.860.67  0.50 - 1.10 mg/dL   Calcium 8.7  8.4 - 57.810.5 mg/dL   Total Protein 6.1  6.0 - 8.3 g/dL   Albumin 2.4 (*) 3.5 - 5.2 g/dL   AST 26  0 - 37 U/L   ALT 15  0 - 35 U/L   Alkaline Phosphatase 109  39 - 117 U/L   Total Bilirubin 0.4  0.3 - 1.2 mg/dL   GFR calc non Af Amer >90  >90 mL/min   GFR calc Af Amer >90  >90 mL/min  CBC     Status: Abnormal   Collection Time    02/01/14  5:20 AM      Result Value Ref Range   WBC 15.7 (*) 4.0 - 10.5 K/uL   RBC 3.41 (*) 3.87 - 5.11 MIL/uL   Hemoglobin 7.6 (*) 12.0 - 15.0 g/dL   HCT 40.9 (*) 81.1 - 91.4 %   MCV 73.9 (*) 78.0 - 100.0 fL   MCH 22.3 (*) 26.0 - 34.0 pg   MCHC 30.2  30.0 - 36.0 g/dL   RDW 78.2 (*) 95.6 - 21.3 %   Platelets 252  150 - 400 K/uL   Pt w/ mild Pre-E. Discussed w/ Dr. Macon Large. No Mag sulfate at this time.   Transfer to [facility] Repeat CBC at 1000.  [place], CNM 02/01/2014 6:09 AM

## 2014-02-01 NOTE — Progress Notes (Signed)
UR chart review completed.  

## 2014-02-02 ENCOUNTER — Inpatient Hospital Stay (HOSPITAL_COMMUNITY): Admission: RE | Admit: 2014-02-02 | Payer: Medicaid Other | Source: Ambulatory Visit

## 2014-02-02 MED ORDER — IBUPROFEN 600 MG PO TABS: 600.0000 mg | ORAL_TABLET | Freq: Four times a day (QID) | ORAL | Status: DC | PRN

## 2014-02-02 NOTE — Discharge Summary (Signed)
Obstetric Discharge Summary Reason for Admission: onset of labor Prenatal Procedures: none Intrapartum Procedures: spontaneous vaginal delivery and GBS prophylaxis Postpartum Procedures: none Complications-Operative and Postpartum: [facility]  [facility] Course: Deanna Murray is a 23 y.o. [id number] who had SVD at 2957w0d. Complications included mild pre eclampsia (elevated BP, HA, visual changes) and mild PPH. Pt began passing clots 5 hrs postpartum, total EBL ~550. Hgb 7.2 today, baseline 7.9. Pt is doing well this morning, no complaints. She is bottle feeding and would like BTL for contraception. She is planning an outpatient circumcision.     Delivery Note At 3:28 AM a viable female was delivered via Vaginal, Spontaneous Delivery (Presentation: ROA).  APGAR: 10,10 ; weight - pending.  Placenta status: Intact, Spontaneous.  Cord: 3 vessel cord with the following complications: None.  Cord pH: N/A.  Anesthesia: Epidural  Episiotomy: None Lacerations: None; Small skin tear (hemostatic).  Suture Repair: N/A Est. Blood Loss (mL): 250  Mom to postpartum.  Baby to Couplet care / Skin to Skin.  Waldon Merlnderson, Brett M 02/02/2014, 7:47 AM     H/H: Lab Results  Component Value Date/Time   HGB 7.2* 02/01/2014  9:48 AM   HCT 23.5* 02/01/2014  9:48 AM    Filed Vitals:   02/02/14 0610  BP: 127/83  Pulse: 88  Temp: 98.8 F (37.1 C)  Resp: 18    Physical Exam: VSS NAD Abd: Appropriately tender, ND, Uterus firm Incision: n/a No c/c/e, Neg homan's sign, neg cords Lochia Appropriate CV: RRR Pulm: CTA bilat  Discharge Diagnoses: Term Pregnancy-delivered, Preelampsia and Mild PPH  Discharge Information: Date: 05/14/2011 Activity: pelvic rest Diet: routine  Medications: None Breast feeding:  No: bottle Condition: stable Instructions: refer to handout Discharge to: home      Medication List    TAKE these medications       ibuprofen 600 MG tablet  Commonly known as:   ADVIL,MOTRIN  Take 1 tablet (600 mg total) by mouth every 6 (six) hours as needed (pain scale < 4).      ASK your doctor about these medications       nitrofurantoin (macrocrystal-monohydrate) 100 MG capsule  Commonly known as:  MACROBID  Take 100 mg by mouth 2 (two) times daily.     ondansetron 4 MG disintegrating tablet  Commonly known as:  ZOFRAN-ODT  Take 4 mg by mouth every 8 (eight) hours as needed for nausea.     prenatal multivitamin Tabs tablet  Take 1 tablet by mouth daily at 12 noon.           Follow-up Information   [facility] Schedule an appointment as soon as possible for a visit in 1 week. (b/p check)    Contact information:   [address] [place] [address] [phone]      Manya Silvasnderson, Brett M 02/02/2014,7:47 AM   I have seen and examined this patient and agree the above assessment. CRESENZO-DISHMAN,Jakai Onofre 02/02/2014 3:13 PM

## 2014-02-02 NOTE — Discharge Instructions (Signed)

## 2014-02-05 NOTE — Discharge Summary (Signed)
Attestation of Attending Supervision of Advanced Practitioner (CNM/NP): Evaluation and management procedures were performed by the Advanced Practitioner under my supervision and collaboration.  I have reviewed the Advanced Practitioner's note and chart, and I agree with the management and plan.  HARRAWAY-SMITH, Alantra Popoca 6:11 PM     

## 2014-02-06 ENCOUNTER — Encounter: Payer: Self-pay | Admitting: *Deleted

## 2014-02-06 ENCOUNTER — Encounter: Payer: Self-pay | Admitting: Obstetrics & Gynecology

## 2014-02-13 NOTE — MAU Provider Note (Signed)
Attestation of Attending Supervision of Advanced Practitioner (CNM/NP): Evaluation and management procedures were performed by the Advanced Practitioner under my supervision and collaboration. I have reviewed the Advanced Practitioner's note and chart, and I agree with the management and plan.  Lesly DukesKelly H Angelos Wasco 7:13 PM

## 2014-02-28 ENCOUNTER — Ambulatory Visit: Payer: Medicaid Other | Admitting: Obstetrics & Gynecology

## 2014-03-06 ENCOUNTER — Ambulatory Visit: Payer: Medicaid Other | Admitting: Family Medicine

## 2014-03-06 ENCOUNTER — Encounter: Payer: Self-pay | Admitting: Family Medicine

## 2014-04-09 ENCOUNTER — Encounter: Payer: Medicaid Other | Admitting: Obstetrics & Gynecology

## 2014-06-10 ENCOUNTER — Emergency Department (HOSPITAL_COMMUNITY)
Admission: EM | Admit: 2014-06-10 | Discharge: 2014-06-10 | Disposition: A | Discharge status: Home / Self Care | Payer: Medicaid Other | Attending: Emergency Medicine | Admitting: Emergency Medicine

## 2014-06-10 ENCOUNTER — Encounter (HOSPITAL_COMMUNITY): Payer: Self-pay | Admitting: Emergency Medicine

## 2014-06-10 DIAGNOSIS — Z3202 Encounter for pregnancy test, result negative: Secondary | ICD-10-CM | POA: Insufficient documentation

## 2014-06-10 DIAGNOSIS — N949 Unspecified condition associated with female genital organs and menstrual cycle: Secondary | ICD-10-CM | POA: Insufficient documentation

## 2014-06-10 DIAGNOSIS — B9689 Other specified bacterial agents as the cause of diseases classified elsewhere: Secondary | ICD-10-CM | POA: Diagnosis not present

## 2014-06-10 DIAGNOSIS — A5901 Trichomonal vulvovaginitis: Secondary | ICD-10-CM | POA: Diagnosis not present

## 2014-06-10 DIAGNOSIS — N760 Acute vaginitis: Secondary | ICD-10-CM | POA: Diagnosis not present

## 2014-06-10 DIAGNOSIS — A499 Bacterial infection, unspecified: Secondary | ICD-10-CM | POA: Insufficient documentation

## 2014-06-10 DIAGNOSIS — A599 Trichomoniasis, unspecified: Secondary | ICD-10-CM

## 2014-06-10 DIAGNOSIS — Z862 Personal history of diseases of the blood and blood-forming organs and certain disorders involving the immune mechanism: Secondary | ICD-10-CM | POA: Insufficient documentation

## 2014-06-10 LAB — URINE MICROSCOPIC-ADD ON

## 2014-06-10 LAB — URINALYSIS, ROUTINE W REFLEX MICROSCOPIC
Bilirubin Urine: NEGATIVE
GLUCOSE, UA: NEGATIVE mg/dL
Hgb urine dipstick: NEGATIVE
Ketones, ur: NEGATIVE mg/dL
NITRITE: NEGATIVE
PROTEIN: NEGATIVE mg/dL
Specific Gravity, Urine: 1.024 (ref 1.005–1.030)
Urobilinogen, UA: 0.2 mg/dL (ref 0.0–1.0)
pH: 6 (ref 5.0–8.0)

## 2014-06-10 LAB — WET PREP, GENITAL: YEAST WET PREP: NONE SEEN

## 2014-06-10 LAB — POC URINE PREG, ED: Preg Test, Ur: NEGATIVE

## 2014-06-10 MED ORDER — ACETAMINOPHEN 500 MG PO TABS
1000.0000 mg | ORAL_TABLET | Freq: Once | ORAL | Status: AC
  Administered 2014-06-10: 1000 mg via ORAL
  Filled 2014-06-10: qty 2

## 2014-06-10 MED ORDER — LIDOCAINE HCL 1 % IJ SOLN
INTRAMUSCULAR | Status: AC
  Administered 2014-06-10: 0.9 mL
  Filled 2014-06-10: qty 20

## 2014-06-10 MED ORDER — AZITHROMYCIN 250 MG PO TABS
1000.0000 mg | ORAL_TABLET | Freq: Once | ORAL | Status: AC
  Administered 2014-06-10: 1000 mg via ORAL
  Filled 2014-06-10: qty 4

## 2014-06-10 MED ORDER — METRONIDAZOLE 500 MG PO TABS
2000.0000 mg | ORAL_TABLET | Freq: Once | ORAL | Status: AC
  Administered 2014-06-10: 2000 mg via ORAL
  Filled 2014-06-10: qty 4

## 2014-06-10 MED ORDER — METRONIDAZOLE 500 MG PO TABS: 500.0000 mg | ORAL_TABLET | Freq: Two times a day (BID) | ORAL | Status: DC

## 2014-06-10 MED ORDER — ONDANSETRON 8 MG PO TBDP
8.0000 mg | ORAL_TABLET | Freq: Once | ORAL | Status: AC
  Administered 2014-06-10: 8 mg via ORAL
  Filled 2014-06-10: qty 1

## 2014-06-10 MED ORDER — CEFTRIAXONE SODIUM 250 MG IJ SOLR
250.0000 mg | Freq: Once | INTRAMUSCULAR | Status: AC
  Administered 2014-06-10: 250 mg via INTRAMUSCULAR
  Filled 2014-06-10: qty 250

## 2014-06-10 NOTE — ED Provider Notes (Addendum)
TIME SEEN: 1:35 PM  CHIEF COMPLAINT: Lower abdominal pain, nausea and vomiting this morning  HPI: Patient is a 23 year old G3 P3 who presents emergency room with 2 half weeks of lower abdominal pain that radiates into her lower back. She did have 4 episodes of nonbloody, nonbilious vomiting this morning. She states that this feels exactly like what she is fully wishes been pregnant before. She states she took 2 pregnancy tests at home, one was positive and one was negative. She denies any fevers, chills, nausea, vomiting or diarrhea, dysuria or hematuria, vaginal bleeding or discharge. She denies any history of prior STDs. She is sexually active with one partner and does not use protection.  ROS: See HPI Constitutional: no fever  Eyes: no drainage  ENT: no runny nose   Cardiovascular:  no chest pain  Resp: no SOB  GI: no vomiting GU: no dysuria Integumentary: no rash  Allergy: no hives  Musculoskeletal: no leg swelling  Neurological: no slurred speech ROS otherwise negative  PAST MEDICAL HISTORY/PAST SURGICAL HISTORY:  Past Medical History  Diagnosis Date  . Seizure   . Pregnancy     7 months  . Anemia     MEDICATIONS:  Prior to Admission medications   Not on File    ALLERGIES:  No Known Allergies  SOCIAL HISTORY:  History  Substance Use Topics  . Smoking status: Never Smoker   . Smokeless tobacco: Never Used  . Alcohol Use: No    FAMILY HISTORY: Family History  Problem Relation Age of Onset  . Alcohol abuse Neg Hx   . Arthritis Neg Hx   . Asthma Neg Hx   . Birth defects Neg Hx   . Cancer Neg Hx   . COPD Neg Hx   . Depression Neg Hx   . Diabetes Neg Hx   . Drug abuse Neg Hx   . Early death Neg Hx   . Hearing loss Neg Hx   . Heart disease Neg Hx   . Hyperlipidemia Neg Hx   . Hypertension Neg Hx   . Kidney disease Neg Hx   . Learning disabilities Neg Hx   . Mental illness Neg Hx   . Mental retardation Neg Hx   . Miscarriages / Stillbirths Neg Hx   .  Stroke Neg Hx   . Vision loss Neg Hx     EXAM: BP 126/77  Pulse 73  Temp(Src) 98.3 F (36.8 C) (Oral)  Resp 16  SpO2 100% CONSTITUTIONAL: Alert and oriented and responds appropriately to questions. Well-appearing; well-nourished HEAD: Normocephalic EYES: Conjunctivae clear, PERRL ENT: normal nose; no rhinorrhea; moist mucous membranes; pharynx without lesions noted NECK: Supple, no meningismus, no LAD  CARD: RRR; S1 and S2 appreciated; no murmurs, no clicks, no rubs, no gallops RESP: Normal chest excursion without splinting or tachypnea; breath sounds clear and equal bilaterally; no wheezes, no rhonchi, no rales,  ABD/GI: Normal bowel sounds; non-distended; soft, non-tender, no rebound, no guarding GU:  Normal external genitalia, no vaginal bleeding, no adnexal tenderness or fullness, no cervical motion tenderness, patient has a small amount of thin white vaginal discharge BACK:  The back appears normal and is non-tender to palpation, there is no CVA tenderness EXT: Normal ROM in all joints; non-tender to palpation; no edema; normal capillary refill; no cyanosis    SKIN: Normal color for age and race; warm NEURO: Moves all extremities equally PSYCH: The patient's mood and manner are appropriate. Grooming and personal hygiene are appropriate.  MEDICAL DECISION  MAKING: Patient here with pelvic pain. She states she's concerned she is pregnant. Urine pregnancy test is negative. Pelvic exam is unremarkable. Abdominal exam is benign. She is hemodynamically stable. She states she started feeling better after Tylenol and Zofran. Given her benign exam, doubt torsion, ovarian cyst, ectopic. Will obtain urinalysis, pelvic cultures pending.  ED PROGRESS: Patient's urine shows small leukocytes and a few squamous cells but no other sign of infection. Suspect this is a dirty catch. Her wet prep shows clue cells and also Trichomonas. Will treat with Flagyl as well as ceftriaxone and azithromycin  empirically for gonorrhea and Chlamydia. Offered HIV and syphilis testing today which she states she will [facility]. Discussed with patient and her partner will need to be treated as well. Discussed with her return precautions and supportive care instructions. She verbalized understanding and is comfortable with plan.     Layla MawKristen N Dailah Opperman, DO 06/10/14 1502  Layla MawKristen N Jiraiya Mcewan, DO 06/10/14 1504

## 2014-06-10 NOTE — ED Notes (Signed)
Pt from home via [facility] c/o pelvic pain, nausea, and vomiting since last p.m. She reports there is a chance she could be pregnant.

## 2014-06-10 NOTE — Discharge Instructions (Signed)
Bacterial Vaginosis °Bacterial vaginosis is a vaginal infection that occurs when the normal balance of bacteria in the vagina is disrupted. It results from an overgrowth of certain bacteria. This is the most common vaginal infection in women of childbearing age. Treatment is important to prevent complications, especially in pregnant women, as it can cause a premature delivery. °CAUSES  °Bacterial vaginosis is caused by an increase in harmful bacteria that are normally present in smaller amounts in the vagina. Several different kinds of bacteria can cause bacterial vaginosis. However, the reason that the condition develops is not fully understood. °RISK FACTORS °Certain activities or behaviors can put you at an increased risk of developing bacterial vaginosis, including: °· Having a new sex partner or multiple sex partners. °· Douching. °· Using an intrauterine device (IUD) for contraception. °Women do not get bacterial vaginosis from toilet seats, bedding, swimming pools, or contact with objects around them. °SIGNS AND SYMPTOMS  °Some women with bacterial vaginosis have no signs or symptoms. Common symptoms include: °· Grey vaginal discharge. °· A fishlike odor with discharge, especially after sexual intercourse. °· Itching or burning of the vagina and vulva. °· Burning or pain with urination. °DIAGNOSIS  °Your health care provider will take a medical history and examine the vagina for signs of bacterial vaginosis. A sample of vaginal fluid may be taken. Your health care provider will look at this sample under a microscope to check for bacteria and abnormal cells. A vaginal pH test may also be done.  °TREATMENT  °Bacterial vaginosis may be treated with antibiotic medicines. These may be given in the form of a pill or a vaginal cream. A second round of antibiotics may be prescribed if the condition comes back after treatment.  °HOME CARE INSTRUCTIONS  °· Only take over-the-counter or prescription medicines as  directed by your health care provider. °· If antibiotic medicine was prescribed, take it as directed. Make sure you finish it even if you start to feel better. °· Do not have sex until treatment is completed. °· Tell all sexual partners that you have a vaginal infection. They should see their health care provider and be treated if they have problems, such as a mild rash or itching. °· Practice safe sex by using condoms and only having one sex partner. °SEEK MEDICAL CARE IF:  °· Your symptoms are not improving after 3 days of treatment. °· You have increased discharge or pain. °· You have a fever. °MAKE SURE YOU:  °· Understand these instructions. °· Will watch your condition. °· Will get help right away if you are not doing well or get worse. °FOR MORE INFORMATION  °Centers for Disease Control and Prevention, Division of STD Prevention: www.cdc.gov/std °American Sexual Health Association (ASHA): www.ashastd.org  °Document Released: 10/19/2005 Document Revised: 08/09/2013 Document Reviewed: 05/31/2013 °ExitCare® Patient Information ©2015 [employer]. This information is not intended to replace advice given to you by your health care provider. Make sure you discuss any questions you have with your health care provider. °Trichomoniasis °Trichomoniasis is an infection caused by an organism called Trichomonas. The infection can affect both women and men. In women, the outer female genitalia and the vagina are affected. In men, the penis is mainly affected, but the prostate and other reproductive organs can also be involved. Trichomoniasis is a sexually transmitted infection (STI) and is most often passed to another person through sexual contact.  °RISK FACTORS °· Having unprotected sexual intercourse. °· Having sexual intercourse with an infected partner. °SIGNS AND SYMPTOMS  °  Symptoms of trichomoniasis in women include: °· Abnormal gray-green frothy vaginal discharge. °· Itching and irritation of the  vagina. °· Itching and irritation of the area outside the vagina. °Symptoms of trichomoniasis in men include:  °· Penile discharge with or without pain. °· Pain during urination. This results from inflammation of the urethra. °DIAGNOSIS  °Trichomoniasis may be found during a Pap test or physical exam. Your health care provider may use one of the following methods to help diagnose this infection: °· Examining vaginal discharge under a microscope. For men, urethral discharge would be examined. °· Testing the pH of the vagina with a test tape. °· Using a vaginal swab test that checks for the Trichomonas organism. A test is available that provides results within a few minutes. °· Doing a culture test for the organism. This is not usually needed. °TREATMENT  °· You may be given medicine to fight the infection. Women should inform their health care provider if they could be or are pregnant. Some medicines used to treat the infection should not be taken during pregnancy. °· Your health care provider may recommend over-the-counter medicines or creams to decrease itching or irritation. °· Your sexual partner will need to be treated if infected. °HOME CARE INSTRUCTIONS  °· Take medicines only as directed by your health care provider. °· Take over-the-counter medicine for itching or irritation as directed by your health care provider. °· Do not have sexual intercourse while you have the infection. °· Women should not douche or wear tampons while they have the infection. °· Discuss your infection with your partner. Your partner may have gotten the infection from you, or you may have gotten it from your partner. °· Have your sex partner get examined and treated if necessary. °· Practice safe, informed, and protected sex. °· See your health care provider for other STI testing. °SEEK MEDICAL CARE IF:  °· You still have symptoms after you finish your medicine. °· You develop abdominal pain. °· You have pain when you urinate. °· You  have bleeding after sexual intercourse. °· You develop a rash. °· Your medicine makes you sick or makes you throw up (vomit). °MAKE SURE YOU: °· Understand these instructions. °· Will watch your condition. °· Will get help right away if you are not doing well or get worse. °Document Released: 04/14/2001 Document Revised: 03/05/2014 Document Reviewed: 07/31/2013 °ExitCare® Patient Information ©2015 [employer]. This information is not intended to replace advice given to you by your health care provider. Make sure you discuss any questions you have with your health care provider. ° °

## 2014-06-12 LAB — GC/CHLAMYDIA PROBE AMP
CT Probe RNA: POSITIVE — AB
GC Probe RNA: NEGATIVE

## 2014-06-14 ENCOUNTER — Telehealth (HOSPITAL_BASED_OUTPATIENT_CLINIC_OR_DEPARTMENT_OTHER): Payer: Self-pay | Admitting: Emergency Medicine

## 2014-06-14 NOTE — Telephone Encounter (Signed)
Post ED Visit - Positive Culture Follow-up   Positive Chlamydia culture Treated with Rocephin and Zithromax, organism sensitive to the same and no further patient follow-up is required at this time. [facility] faxed  08/13/150 @ 1152 Pt contacted.  ID verified. Patient notified of positive Chlamydia and that treatment was given while in ED of Rocephin and Zithromax. STD instructions provided. Patient verbalized understanding.   Jiles HaroldGammons, Uzoma Vivona Chaney 06/14/2014, 11:54 AM

## 2014-09-03 ENCOUNTER — Encounter (HOSPITAL_COMMUNITY): Payer: Self-pay | Admitting: Emergency Medicine

## 2014-09-28 ENCOUNTER — Emergency Department (HOSPITAL_COMMUNITY)
Admission: EM | Admit: 2014-09-28 | Discharge: 2014-09-28 | Disposition: A | Discharge status: Home / Self Care | Payer: Medicaid Other | Attending: Emergency Medicine | Admitting: Emergency Medicine

## 2014-09-28 ENCOUNTER — Emergency Department (HOSPITAL_COMMUNITY): Payer: Medicaid Other

## 2014-09-28 ENCOUNTER — Encounter (HOSPITAL_COMMUNITY): Payer: Self-pay | Admitting: *Deleted

## 2014-09-28 DIAGNOSIS — Z3202 Encounter for pregnancy test, result negative: Secondary | ICD-10-CM | POA: Insufficient documentation

## 2014-09-28 DIAGNOSIS — R200 Anesthesia of skin: Secondary | ICD-10-CM | POA: Diagnosis present

## 2014-09-28 DIAGNOSIS — Z862 Personal history of diseases of the blood and blood-forming organs and certain disorders involving the immune mechanism: Secondary | ICD-10-CM | POA: Insufficient documentation

## 2014-09-28 DIAGNOSIS — R29818 Other symptoms and signs involving the nervous system: Secondary | ICD-10-CM | POA: Insufficient documentation

## 2014-09-28 LAB — CBC
HCT: 38.6 % (ref 36.0–46.0)
HEMOGLOBIN: 12.1 g/dL (ref 12.0–15.0)
MCH: 24.9 pg — ABNORMAL LOW (ref 26.0–34.0)
MCHC: 31.3 g/dL (ref 30.0–36.0)
MCV: 79.4 fL (ref 78.0–100.0)
Platelets: 264 10*3/uL (ref 150–400)
RBC: 4.86 MIL/uL (ref 3.87–5.11)
RDW: 16.6 % — AB (ref 11.5–15.5)
WBC: 6.6 10*3/uL (ref 4.0–10.5)

## 2014-09-28 LAB — COMPREHENSIVE METABOLIC PANEL
ALT: 11 U/L (ref 0–35)
AST: 17 U/L (ref 0–37)
Albumin: 4 g/dL (ref 3.5–5.2)
Alkaline Phosphatase: 69 U/L (ref 39–117)
Anion gap: 13 (ref 5–15)
BILIRUBIN TOTAL: 0.3 mg/dL (ref 0.3–1.2)
BUN: 11 mg/dL (ref 6–23)
CHLORIDE: 102 meq/L (ref 96–112)
CO2: 25 meq/L (ref 19–32)
CREATININE: 0.8 mg/dL (ref 0.50–1.10)
Calcium: 9.4 mg/dL (ref 8.4–10.5)
GFR calc Af Amer: >90 mL/min (ref >90)
Glucose, Bld: 77 mg/dL (ref 70–99)
POTASSIUM: 3.7 meq/L (ref 3.7–5.3)
Sodium: 140 mEq/L (ref 137–147)
Total Protein: 8.1 g/dL (ref 6.0–8.3)

## 2014-09-28 LAB — URINE MICROSCOPIC-ADD ON

## 2014-09-28 LAB — RAPID URINE DRUG SCREEN, HOSP PERFORMED
Amphetamines: NOT DETECTED
BARBITURATES: NOT DETECTED
Benzodiazepines: NOT DETECTED
Cocaine: NOT DETECTED
OPIATES: NOT DETECTED
Tetrahydrocannabinol: POSITIVE — AB

## 2014-09-28 LAB — URINALYSIS, ROUTINE W REFLEX MICROSCOPIC
Bilirubin Urine: NEGATIVE
Glucose, UA: NEGATIVE mg/dL
HGB URINE DIPSTICK: NEGATIVE
Ketones, ur: NEGATIVE mg/dL
NITRITE: NEGATIVE
Protein, ur: NEGATIVE mg/dL
SPECIFIC GRAVITY, URINE: 1.022 (ref 1.005–1.030)
UROBILINOGEN UA: 0.2 mg/dL (ref 0.0–1.0)
pH: 6.5 (ref 5.0–8.0)

## 2014-09-28 LAB — DIFFERENTIAL
BASOS ABS: 0.1 10*3/uL (ref 0.0–0.1)
Basophils Relative: 1 % (ref 0–1)
Eosinophils Absolute: 0.2 10*3/uL (ref 0.0–0.7)
Eosinophils Relative: 2 % (ref 0–5)
LYMPHS PCT: 34 % (ref 12–46)
Lymphs Abs: 2.2 10*3/uL (ref 0.7–4.0)
MONO ABS: 0.6 10*3/uL (ref 0.1–1.0)
MONOS PCT: 9 % (ref 3–12)
NEUTROS ABS: 3.5 10*3/uL (ref 1.7–7.7)
Neutrophils Relative %: 54 % (ref 43–77)

## 2014-09-28 LAB — POC URINE PREG, ED: PREG TEST UR: NEGATIVE

## 2014-09-28 LAB — ETHANOL

## 2014-09-28 NOTE — ED Provider Notes (Signed)
CSN: [id number]     Arrival date & time 09/28/14  1647 History   First MD Initiated Contact with Patient 09/28/14 1703     Chief Complaint  Patient presents with  . Numbness     (Consider location/radiation/quality/duration/timing/severity/associated sxs/prior Treatment) Patient is a 23 y.o. female presenting with neurologic complaint.  Neurologic Problem This is a new problem. Episode onset: on awakening this am. The problem occurs constantly. The problem has not changed since onset.Pertinent negatives include no chest pain, no abdominal pain, no headaches and no shortness of breath. Nothing aggravates the symptoms. Nothing relieves the symptoms. She has tried nothing for the symptoms.    Past Medical History  Diagnosis Date  . Seizure   . Pregnancy     7 months  . Anemia    Past Surgical History  Procedure Laterality Date  . No past surgeries     Family History  Problem Relation Age of Onset  . Alcohol abuse Neg Hx   . Arthritis Neg Hx   . Asthma Neg Hx   . Birth defects Neg Hx   . Cancer Neg Hx   . COPD Neg Hx   . Depression Neg Hx   . Diabetes Neg Hx   . Drug abuse Neg Hx   . Early death Neg Hx   . Hearing loss Neg Hx   . Heart disease Neg Hx   . Hyperlipidemia Neg Hx   . Hypertension Neg Hx   . Kidney disease Neg Hx   . Learning disabilities Neg Hx   . Mental illness Neg Hx   . Mental retardation Neg Hx   . Miscarriages / Stillbirths Neg Hx   . Stroke Neg Hx   . Vision loss Neg Hx    History  Substance Use Topics  . Smoking status: Never Smoker   . Smokeless tobacco: Never Used  . Alcohol Use: No   OB History    Gravida Para Term Preterm AB TAB SAB Ectopic Multiple Living   3 3 3       3      Review of Systems  Respiratory: Negative for shortness of breath.   Cardiovascular: Negative for chest pain.  Gastrointestinal: Negative for abdominal pain.  Neurological: Negative for headaches.  All other systems reviewed and are  negative.     Allergies  Review of patient's allergies indicates no known allergies.  Home Medications   Prior to Admission medications   Not on File   BP 121/75 mmHg  Pulse 92  Temp(Src) 97.9 F (36.6 C) (Oral)  Resp 18  SpO2 99%  LMP 09/02/2014 Physical Exam  Constitutional: She is oriented to person, place, and time. She appears well-developed and well-nourished.  HENT:  Head: Normocephalic and atraumatic.  Right Ear: External ear normal.  Left Ear: External ear normal.  Eyes: Conjunctivae and EOM are normal. Pupils are equal, round, and reactive to light.  Neck: Normal range of motion. Neck supple.  Cardiovascular: Normal rate, regular rhythm, normal heart sounds and intact distal pulses.   Pulmonary/Chest: Effort normal and breath sounds normal.  Abdominal: Soft. Bowel sounds are normal. There is no tenderness.  Musculoskeletal: Normal range of motion.  Neurological: She is alert and oriented to person, place, and time. She has normal strength and normal reflexes. A sensory deficit (subj total loss of lateral distal portion of both arm and leg) is present. No cranial nerve deficit. Gait normal.  Skin: Skin is warm and dry.  Vitals reviewed.  ED Course  Procedures (including critical care time) Labs Review Labs Reviewed  CBC - Abnormal; Notable for the following:    MCH 24.9 (*)    RDW 16.6 (*)    All other components within normal limits  URINE RAPID DRUG SCREEN (HOSP PERFORMED) - Abnormal; Notable for the following:    Tetrahydrocannabinol POSITIVE (*)    All other components within normal limits  URINALYSIS, ROUTINE W REFLEX MICROSCOPIC - Abnormal; Notable for the following:    APPearance CLOUDY (*)    Leukocytes, UA TRACE (*)    All other components within normal limits  URINE MICROSCOPIC-ADD ON - Abnormal; Notable for the following:    Squamous Epithelial / LPF FEW (*)    All other components within normal limits  ETHANOL  DIFFERENTIAL  COMPREHENSIVE  METABOLIC PANEL  POC URINE PREG, ED    Imaging Review Ct Head Wo Contrast  09/28/2014   CLINICAL DATA:  Right side numbness for 2 days  EXAM: CT HEAD WITHOUT CONTRAST  TECHNIQUE: Contiguous axial images were obtained from the base of the skull through the vertex without intravenous contrast.  COMPARISON:  06/04/2005  FINDINGS: No skull fracture is noted. Paranasal sinuses and mastoid air cells are unremarkable.  No intracranial hemorrhage, mass effect or midline shift.  No hydrocephalus. No acute infarction. No mass lesion is noted on this unenhanced scan. The gray and white-matter differentiation is preserved.  IMPRESSION: No acute intracranial abnormality.  No significant change.   Electronically Signed   By: Natasha MeadLiviu  Pop M.D.   On: 09/28/2014 18:05     EKG Interpretation None      MDM   Final diagnoses:  Sensory loss    23 y.o. female with pertinent PMH of childhood sz presents with sensory loss of RUE and RLE as described above on awakening this am.  She has had pain with walking in lateral thigh x 2 weeks without change.  On arrival today vitals and physical exam as above.  Neuro exam as above with nondermatomal distribution of isolated sensory loss.  Strength equal throughout.  On repeat examination the pt states that she has numbness in medial humerus on R, and medial calf with distal ankle having normal sensation.  Given distribution, highly doubt CVA, TIA, or other emergent pathology.  Discussed broad differential with pt including MS, cord pathology, and gave strict return precautions for worsening symptoms.  Pt was in agreement with plan and will fu with neurology.  1. Sensory loss         Mirian MoMatthew Dariela Stoker, MD 09/28/14 [id number]

## 2014-09-28 NOTE — Discharge Instructions (Signed)

## 2014-09-28 NOTE — ED Notes (Signed)
Pt in c/o right sided numbness that started this morning, it was first noted when patient woke up, pt with normal movement and strength noted, but when touched on her right arm and leg she state she cannot feel it at all, normal sensation to right side of face, pt c/o pain to right arm and leg with movement.

## 2014-10-04 ENCOUNTER — Encounter: Payer: Self-pay | Admitting: Obstetrics & Gynecology

## 2014-10-12 ENCOUNTER — Ambulatory Visit: Payer: Medicaid Other | Admitting: Diagnostic Neuroimaging

## 2014-10-16 ENCOUNTER — Encounter: Payer: Self-pay | Admitting: Diagnostic Neuroimaging

## 2015-01-07 ENCOUNTER — Emergency Department (HOSPITAL_COMMUNITY)
Admission: EM | Admit: 2015-01-07 | Discharge: 2015-01-07 | Disposition: A | Discharge status: Home / Self Care | Payer: Medicaid Other | Attending: Emergency Medicine | Admitting: Emergency Medicine

## 2015-01-07 ENCOUNTER — Encounter (HOSPITAL_COMMUNITY): Payer: Self-pay | Admitting: Emergency Medicine

## 2015-01-07 DIAGNOSIS — Z8669 Personal history of other diseases of the nervous system and sense organs: Secondary | ICD-10-CM | POA: Insufficient documentation

## 2015-01-07 DIAGNOSIS — Z862 Personal history of diseases of the blood and blood-forming organs and certain disorders involving the immune mechanism: Secondary | ICD-10-CM | POA: Diagnosis not present

## 2015-01-07 DIAGNOSIS — N730 Acute parametritis and pelvic cellulitis: Secondary | ICD-10-CM | POA: Insufficient documentation

## 2015-01-07 DIAGNOSIS — Z711 Person with feared health complaint in whom no diagnosis is made: Secondary | ICD-10-CM

## 2015-01-07 DIAGNOSIS — Z113 Encounter for screening for infections with a predominantly sexual mode of transmission: Secondary | ICD-10-CM | POA: Diagnosis not present

## 2015-01-07 DIAGNOSIS — N939 Abnormal uterine and vaginal bleeding, unspecified: Secondary | ICD-10-CM | POA: Diagnosis present

## 2015-01-07 LAB — URINALYSIS, ROUTINE W REFLEX MICROSCOPIC
Bilirubin Urine: NEGATIVE
Glucose, UA: NEGATIVE mg/dL
Ketones, ur: 15 mg/dL — AB
NITRITE: NEGATIVE
Protein, ur: NEGATIVE mg/dL
Specific Gravity, Urine: 1.029 (ref 1.005–1.030)
UROBILINOGEN UA: 1 mg/dL (ref 0.0–1.0)
pH: 8.5 — ABNORMAL HIGH (ref 5.0–8.0)

## 2015-01-07 LAB — I-STAT BETA HCG BLOOD, ED (MC, WL, AP ONLY)

## 2015-01-07 LAB — WET PREP, GENITAL
Trich, Wet Prep: NONE SEEN
Yeast Wet Prep HPF POC: NONE SEEN

## 2015-01-07 LAB — I-STAT CHEM 8, ED
BUN: 12 mg/dL (ref 6–23)
Calcium, Ion: 1.25 mmol/L — ABNORMAL HIGH (ref 1.12–1.23)
Chloride: 104 mmol/L (ref 96–112)
Creatinine, Ser: 0.8 mg/dL (ref 0.50–1.10)
Glucose, Bld: 92 mg/dL (ref 70–99)
HCT: 42 % (ref 36.0–46.0)
Hemoglobin: 14.3 g/dL (ref 12.0–15.0)
POTASSIUM: 3.9 mmol/L (ref 3.5–5.1)
SODIUM: 143 mmol/L (ref 135–145)
TCO2: 24 mmol/L (ref 0–100)

## 2015-01-07 LAB — URINE MICROSCOPIC-ADD ON

## 2015-01-07 LAB — POC URINE PREG, ED: Preg Test, Ur: NEGATIVE

## 2015-01-07 MED ORDER — METRONIDAZOLE 500 MG PO TABS
2000.0000 mg | ORAL_TABLET | Freq: Once | ORAL | Status: AC
  Administered 2015-01-07: 2000 mg via ORAL
  Filled 2015-01-07: qty 4

## 2015-01-07 MED ORDER — AZITHROMYCIN 250 MG PO TABS
1000.0000 mg | ORAL_TABLET | Freq: Once | ORAL | Status: AC
  Administered 2015-01-07: 1000 mg via ORAL
  Filled 2015-01-07: qty 4

## 2015-01-07 MED ORDER — DOXYCYCLINE HYCLATE 100 MG PO CAPS: 100.0000 mg | ORAL_CAPSULE | Freq: Two times a day (BID) | ORAL | Status: DC

## 2015-01-07 MED ORDER — LIDOCAINE HCL (PF) 1 % IJ SOLN
2.0000 mL | Freq: Once | INTRAMUSCULAR | Status: AC
  Administered 2015-01-07: 2 mL
  Filled 2015-01-07: qty 5

## 2015-01-07 MED ORDER — CEFTRIAXONE SODIUM 250 MG IJ SOLR
250.0000 mg | Freq: Once | INTRAMUSCULAR | Status: AC
  Administered 2015-01-07: 250 mg via INTRAMUSCULAR
  Filled 2015-01-07: qty 250

## 2015-01-07 NOTE — Discharge Instructions (Signed)
1. Medications: doxycycline, usual home medications 2. Treatment: rest, drink plenty of fluids, use a condom with every sexual encounter 3. Follow Up: Please followup with your primary doctor in 3 days for discussion of your diagnoses and further evaluation after today's visit; if you do not have a primary care doctor use the resource guide provided to find one; Please return to the ER for worsening symptoms, high fevers or persistent vomiting.  You have been tested for HIV, syphilis, chlamydia and gonorrhea.  These results will be available in approximately 3 days.  Please inform all sexual partners if you test positive for any of these diseases.    Pelvic Inflammatory Disease Pelvic inflammatory disease (PID) refers to an infection in some or all of the female organs. The infection can be in the uterus, ovaries, fallopian tubes, or the surrounding tissues in the pelvis. PID can cause abdominal or pelvic pain that comes on suddenly (acute pelvic pain). PID is a serious infection because it can lead to lasting (chronic) pelvic pain or the inability to have children (infertile).  CAUSES  The infection is often caused by the normal bacteria found in the vaginal tissues. PID may also be caused by an infection that is spread during sexual contact. PID can also occur following:   The birth of a baby.   A miscarriage.   An abortion.   Major pelvic surgery.   The use of an intrauterine device (IUD).   A sexual assault.  RISK FACTORS Certain factors can put a person at higher risk for PID, such as:  Being younger than 25 years.  Being sexually active at [place] age.  Usingnonbarrier contraception.  Havingmultiple sexual partners.  Having sex with someone who has symptoms of a genital infection.  Using oral contraception. Other times, certain behaviors can increase the possibility of getting PID, such as:  Having sex during your period.  Using a vaginal douche.  Having an  intrauterine device (IUD) in place. SYMPTOMS   Abdominal or pelvic pain.   Fever.   Chills.   Abnormal vaginal discharge.  Abnormal uterine bleeding.   Unusual pain shortly after finishing your period. DIAGNOSIS  Your caregiver will choose some of the following methods to make a diagnosis, such as:   Performinga physical exam and history. A pelvic exam typically reveals a very tender uterus and surrounding pelvis.   Ordering laboratory tests including a pregnancy test, blood tests, and urine test.  Orderingcultures of the vagina and cervix to check for a sexually transmitted infection (STI).  Performing an ultrasound.   Performing a laparoscopic procedure to look inside the pelvis.  TREATMENT   Antibiotic medicines may be prescribed and taken by mouth.   Sexual partners may be treated when the infection is caused by a sexually transmitted disease (STD).   Hospitalization may be needed to give antibiotics intravenously.  Surgery may be needed, but this is rare. It may take weeks until you are completely well. If you are diagnosed with PID, you should also be checked for human immunodeficiency virus (HIV). HOME CARE INSTRUCTIONS   If given, take your antibiotics as directed. Finish the medicine even if you start to feel better.   Only take over-the-counter or prescription medicines for pain, discomfort, or fever as directed by your caregiver.   Do not have sexual intercourse until treatment is completed or as directed by your caregiver. If PID is confirmed, your recent sexual partner(s) will need treatment.   Keep your follow-up appointments. SEEK MEDICAL  CARE IF:   You have increased or abnormal vaginal discharge.   You need prescription medicine for your pain.   You vomit.   You cannot take your medicines.   Your partner has an STD.  SEEK IMMEDIATE MEDICAL CARE IF:   You have a fever.   You have increased abdominal or pelvic pain.    You have chills.   You have pain when you urinate.   You are not better after 72 hours following treatment.  MAKE SURE YOU:   Understand these instructions.  Will watch your condition.  Will get help right away if you are not doing well or get worse. Document Released: 10/19/2005 Document Revised: 02/13/2013 Document Reviewed: 10/15/2011 [facility] Patient Information 2015 [place], [place]. This information is not intended to replace advice given to you by your health care provider. Make sure you discuss any questions you have with your health care provider.    Emergency Department Resource Guide 1) Find a Doctor and Pay Out of Pocket Although you won't have to find out who is covered by your insurance plan, it is a good idea to ask around and get recommendations. You will then need to call the office and see if the doctor you have chosen will accept you as a new patient and what types of options they offer for patients who are self-pay. Some doctors offer discounts or will set up payment plans for their patients who do not have insurance, but you will need to ask so you aren't surprised when you get to your appointment.  2) Contact Your Local Health Department Not all health departments have doctors that can see patients for sick visits, but many do, so it is worth a call to see if yours does. If you don't know where your local health department is, you can check in your phone book. The CDC also has a tool to help you locate your state's health department, and many state websites also have listings of all of their local health departments.  3) Find a Walk-in Clinic If your illness is not likely to be very severe or complicated, you may want to try a walk in clinic. These are popping up all over the country in pharmacies, drugstores, and shopping centers. They're usually staffed by nurse practitioners or physician assistants that have been trained to treat common illnesses and  complaints. They're usually fairly quick and inexpensive. However, if you have serious medical issues or chronic medical problems, these are probably not your best option.  No Primary Care Doctor: - Call Health Connect at  [phone] - they can help you locate a primary care doctor that  accepts your insurance, provides certain services, etc. - Physician Referral Service- [phone]  Chronic Pain Problems: Organization         Address  Phone   Notes  [facility]  [phone] Patients need to be referred by their primary care doctor.   Medication Assistance: Organization         Address  Phone   Notes  [facility] Medication [facility] [address] [place], [place] [address] [phone] --Must be a resident of [facility] -- Must have NO insurance coverage whatsoever (no Medicaid/ Medicare, etc.) -- The pt. MUST have a primary care doctor that directs their care regularly and follows them in the community   Med[facility]ist  [phone]   [employer]  [phone]    Agencies that provide inexpensive medical  care: Organization         Address  Phone   Notes  [facility]  [phone]   [facility]    [phone]   [facility] [address] [place], [place] [address] [phone]   [facility] [address] [place], [place] [phone]   Planned Parenthood    [phone]   [facility]    [phone]   [facility]  [address], [place] Phone:  [phone], Fax:  [phone] Hours of Operation:  9 am - 6 pm, M-F.  Also accepts Medicaid/Medicare and self-pay.  [facility]  [address], [place] Phone: [phone], Fax: [phone]. Hours of Operation:  8:30 am - 5:30 pm, M-F.  Also accepts Medicaid and self-pay.  [facility] [place] [address] [place] Phone: [phone]   [facility] [address] Natasha Bence [place], [place] [phone], Ext. [phone] Mondays & Thursdays: 7-9 AM.  First 15 patients are seen on a first come, first serve basis.    Medicaid-accepting [facility] Providers:  Organization         Address  Phone   Notes  [facility] [address], [place] [phone] Also accepts self-pay patients.  [facility] [address] Laurell Josephs [place], [place]  [phone]   [facility] [address], [place] [phone]   [facility] [address] [place] [phone]   Renaye Rakers [address], [place]   [phone] Only accepts [place] Access [place] patients after they have their name applied to their card.   Self-Pay (no insurance) in [facility]:  Organization         Address  Phone   Notes  Sickle Cell Patients, [facility] [address] [place], [place] [phone]   [facility] [address] [place], [place] [phone]   [facility] [place]  [address], [place] [phone]   [facility]/Dr. Osei-Bonsu  [address], [place] or [address], [place] [phone] Phone number for both [place] and [place] locations is the same.  [facility] [address], [place] [phone]   [facility] [address] [place] or [address] [phone]   [facility] [address], [place] [phone], phone; [phone], fax Sees patients 1st and 3rd Saturday of every month.  Must not qualify for public or private insurance (i.e. Medicaid, Medicare, [place] Health Choice, Veterans' Benefits)  Household income should be no more than 200% of the poverty level  The clinic cannot treat you if you are pregnant or think you are pregnant  Sexually transmitted diseases are not treated at the clinic.    Dental Care: Organization         Address  Phone  Notes  [facility] Department of [facility] [address] [place], [place] [phone] Accepts children up to age 73 who are enrolled in [place] or [place] Health Choice; pregnant women with a Medicaid card; and children who have applied for Medicaid or [place] Health Choice, but were  declined, whose parents can pay a reduced fee at time of service.  [facility] [place]  [address], [place] [phone] Accepts children up to age 26 who are enrolled in [place] or [place] Health Choice; pregnant women with a Medicaid card; and children who have applied for Medicaid or [place] Health Choice, but were declined, whose parents can pay a reduced fee at time of service.  [facility]  [address], [place] [phone] Patients are seen by appointment only. Walk-ins are not accepted. [facility] will see patients 26 years of age and older. Monday - Tuesday (8am-5pm) Most Wednesdays (8:30-5pm) $30 per visit, cash only  [facility]  [address], [facility] [phone] Patients are seen by appointment only. Walk-ins are not accepted. [facility] will see patients 26 years of age and older. One Wednesday Evening (Monthly: Volunteer Based).  $30 per visit, cash only  [facility] [facility]  [phone] for adults; Children under age 454, call Graduate Pediatric Dentistry at [phone]. Children aged 474-14, please call [phone] to request a pediatric application.  Dental services are provided in all areas of dental care including fillings, crowns and bridges, complete and partial dentures, implants, gum treatment, root canals, and extractions. Preventive care is  also provided. Treatment is provided to both adults and children. Patients are selected via a lottery and there is often a waiting list.   [facility] [address], [place]  [phone] [email or web address]   [facility] [address], [place], [place] [phone], Ext. [phone] Second and Fourth Thursday of each month, opens at 6:30 AM; Clinic ends at 9 AM.  Patients are seen on a first-come first-served basis, and a limited number are seen during each clinic.   [facility]  [address], [place], [place] [phone]   Eligibility Requirements You must have lived in [place], [place], or [place] counties for at least the last three months.   You cannot be eligible for state or federal sponsored [place]healthcare insurance, including CIGNAVeterans Administration, [place]icaid, or [employer]icare.   You generally cannot be eligible for healthcare insurance through your employer.    How to apply: Eligibility screenings are held every Tuesday and Wednesday afternoon from 1:00 pm until 4:00 pm. You do not need an appointment for the interview!  [facility] [address], [place], [place] [phone]   [facility]  [phone]   [facility]  [phone]   [facility]  [phone]    Behavioral Health Resources in the Community: Intensive Outpatient Programs Organization         Address  Phone  Notes  [facility] [address], [place], [place] [phone]   [facility] [address], [place], [place] [phone]   [facility] [address], [place], [place]  [phone]   [facility] [address],  [place], [place] [phone] or [phone]   Substance Abuse Resources Organization         Address  Phone  Notes  [facility]  [phone]   [facility]  [phone]   [facility]  [phone]   [facility]  [phone]   Residential & Outpatient Substance Abuse Program  [phone]   Psychological Services Organization  Address  Phone  Notes  [facility]  [phone]   [facility] Services  [phone]   [facility] [phone] [address] [place] [phone] or [phone]    Mobile Crisis Teams Organization         Address  Phone  Notes  [facility], Mobile Crisis Care Unit  [phone]   [facility]  [address] [place], [place] [phone]   Doristine Locks [address] [place] [phone]    Self-Help/Support Groups Organization         Address  Phone             Notes  [facility] [place] - variety of support groups  [phone]- [phone] Call for more information  Narcotics Anonymous (NA), [facility] [address], [employer] [place]  2 meetings at this location   Statistician         Address  Phone  Notes  [facility] [address],    [place]  [phone]   [facility]  [address], [place] [address], [place], [place] [phone]   [facility] [address] [place], [place] [phone] Admissions: 8am-3pm M-F  [facility] [address],    [place], [place] [phone]   [facility] [address] [place], [place], [place] [phone]   [facility] [address]  [place], [place] [phone]   [facility] - Intensive Outpatient [address] Laurell Josephs [address], [place], [place] [phone]   [facility] [address] [place]  [place], [place] [phone] or [phone]   [facility] ([facility]) [address], [place], [place] [phone] Accepts Medicaid  [facility] [address],  [place] [phone]  Substance Abuse/Addiction Treatment   [facility] Organization         Address  Phone  Notes  [facility]  [phone]   Angie Fava, PhD [address] Ervin Knack [place], [place]   [phone] or [phone]   [facility] Behavioral   [address] [place], [place] [phone]   [facility] [address] [place], [place] [phone] Insurance/Medicaid/sponsorship through [facility] and Families [address]                                    [place], [place] [phone] Therapy/tele-psych/case  [facility] [address], [place] [phone]    Dr. Lolly Mustache  [phone]   [facility]  [employer] [facility] Dept. 1) [address] [place] 2) [address], [place] 3)  [address], [place] [phone]  [phone]   [facility] Child Abuse Hotline [phone] or [phone] (After Hours)

## 2015-01-07 NOTE — ED Provider Notes (Signed)
CSN: [id number]     Arrival date & time 01/07/15  1809 History   First MD Initiated Contact with Patient 01/07/15 2012     Chief Complaint  Patient presents with  . Abdominal Pain  . Vaginal Bleeding     (Consider location/radiation/quality/duration/timing/severity/associated sxs/prior Treatment) The history is provided by the patient and medical records. No language interpreter was used.     Deanna Murray is a 24 y.o. female  with a hx of seizure presents to the Emergency Department complaining of gradual, persistent, progressively worsening lower abd pain and vaginal spotting onset this afternoon.  Pt reports she began birth control on Jan 19th.  Pt reports she began taking the depo-provera shot for the first time.  Pt is sexually active with 1 female partner without regular use of protection.  Pt reports they were both tested for STDs on Jan 19th and was without STDs.  Pt reports no known sick contacts.  Pt reports her lower abd pain is sharp, rated at an 8/10 and located in the suprapubic region.  Pt has not taken any medications for this time.  Pt reports 1 episode of NBNB emesis today, but has otherwise been eating and drinking normally.  No aggravating or alleviating factors.  Pt denies fever, chills, headache, neck pain, chest, pain, SOB, weakness, dizziness, syncope, dysuria.       Past Medical History  Diagnosis Date  . Seizure   . Pregnancy     7 months  . Anemia    Past Surgical History  Procedure Laterality Date  . No past surgeries     Family History  Problem Relation Age of Onset  . Alcohol abuse Neg Hx   . Arthritis Neg Hx   . Asthma Neg Hx   . Birth defects Neg Hx   . Cancer Neg Hx   . COPD Neg Hx   . Depression Neg Hx   . Diabetes Neg Hx   . Drug abuse Neg Hx   . Early death Neg Hx   . Hearing loss Neg Hx   . Heart disease Neg Hx   . Hyperlipidemia Neg Hx   . Hypertension Neg Hx   . Kidney disease Neg Hx   . Learning disabilities Neg Hx   . Mental  illness Neg Hx   . Mental retardation Neg Hx   . Miscarriages / Stillbirths Neg Hx   . Stroke Neg Hx   . Vision loss Neg Hx    History  Substance Use Topics  . Smoking status: Never Smoker   . Smokeless tobacco: Never Used  . Alcohol Use: No   OB History    Gravida Para Term Preterm AB TAB SAB Ectopic Multiple Living   Review of Systems  Constitutional: Negative for fever, diaphoresis, appetite change, fatigue and unexpected weight change.  HENT: Negative for mouth sores.   Eyes: Negative for visual disturbance.  Respiratory: Negative for cough, chest tightness, shortness of breath and wheezing.   Cardiovascular: Negative for chest pain.  Gastrointestinal: Positive for abdominal pain. Negative for nausea, vomiting, diarrhea and constipation.  Endocrine: Negative for polydipsia, polyphagia and polyuria.  Genitourinary: Positive for vaginal bleeding. Negative for dysuria, urgency, frequency and hematuria.  Musculoskeletal: Negative for back pain and neck stiffness.  Skin: Negative for rash.  Allergic/Immunologic: Negative for immunocompromised state.  Neurological: Negative for syncope, light-headedness and headaches.  Hematological: Does not bruise/bleed easily.  Psychiatric/Behavioral: Negative for sleep disturbance. The patient is not nervous/anxious.       Allergies  Review of patient's allergies indicates no known allergies.  Home Medications   Prior to Admission medications   Medication Sig Start Date End Date Taking? Authorizing Provider  doxycycline (VIBRAMYCIN) 100 MG capsule Take 1 capsule (100 mg total) by mouth 2 (two) times daily. 01/07/15   Saivon Prowse, PA-C   BP 111/68 mmHg  Pulse 85  Temp(Src) 98.3 F (36.8 C) (Oral)  Resp 20  SpO2 98% Physical Exam  Constitutional: She appears well-developed and well-nourished. No distress.  Awake, alert, nontoxic appearance  HENT:  Head: Normocephalic and atraumatic.  Mouth/Throat:  Oropharynx is clear and moist. No oropharyngeal exudate.  Eyes: Conjunctivae are normal. No scleral icterus.  Neck: Normal range of motion. Neck supple.  Cardiovascular: Normal rate, regular rhythm, normal heart sounds and intact distal pulses.   No murmur heard. Pulmonary/Chest: Effort normal and breath sounds normal. No respiratory distress. She has no wheezes.  Equal chest expansion  Abdominal: Soft. Bowel sounds are normal. She exhibits no mass. There is tenderness in the suprapubic area. There is no rebound and no guarding. Hernia confirmed negative in the right inguinal area and confirmed negative in the left inguinal area.  Saline, suprapubic abdominal tenderness without guarding or rebound No CVA tenderness  Genitourinary: Uterus normal. No labial fusion. There is no rash, tenderness or lesion on the right labia. There is no rash, tenderness or lesion on the left labia. Uterus is not deviated, not enlarged, not fixed and not tender. Cervix exhibits motion tenderness. Cervix exhibits no discharge and no friability. Right adnexum displays no mass, no tenderness and no fullness. Left adnexum displays no mass, no tenderness and no fullness. No erythema, tenderness or bleeding in the vagina. No foreign body around the vagina. No signs of injury around the vagina. Vaginal discharge ( Thick, brown) found.  Moderate amount of thick brown discharge in the vaginal vault which is blood-tinged Moderate cervical motion tenderness on exam No adnexal masses or tenderness  Musculoskeletal: Normal range of motion. She exhibits no edema.  Lymphadenopathy:       Right: No inguinal adenopathy present.       Left: No inguinal adenopathy present.  Neurological: She is alert.  Speech is clear and goal oriented Moves extremities without ataxia  Skin: Skin is warm and dry. She is not diaphoretic. No erythema.  Psychiatric: She has a normal mood and affect.  Nursing note and vitals reviewed.   ED Course   Procedures (including critical care time) Labs Review Labs Reviewed  WET PREP, GENITAL - Abnormal; Notable for the following:    Clue Cells Wet Prep HPF POC MODERATE (*)    WBC, Wet Prep HPF POC TOO NUMEROUS TO COUNT (*)    All other components within normal limits  URINALYSIS, ROUTINE W REFLEX MICROSCOPIC - Abnormal; Notable for the following:    APPearance HAZY (*)    pH 8.5 (*)    Hgb urine dipstick LARGE (*)    Ketones, ur 15 (*)    Leukocytes, UA SMALL (*)    All other components within normal limits  URINE MICROSCOPIC-ADD ON - Abnormal; Notable for the following:    Squamous Epithelial / LPF FEW (*)    Bacteria, UA FEW (*)    All other components within normal limits  I-STAT CHEM 8, ED - Abnormal; Notable for the following:    Calcium, Ion 1.25 (*)  All other components within normal limits  POC URINE PREG, ED  I-STAT BETA HCG BLOOD, ED ([facility], [facility], [facility] ONLY)  GC/CHLAMYDIA PROBE AMP ([place])    Imaging Review No results found.   EKG Interpretation None      MDM   Final diagnoses:  PID (acute pelvic inflammatory disease)  Concern about STD in female without diagnosis   Deanna Murray presents with suprapubic abdominal tenderness and complaints of bleeding. Patient began the Depo-Provera shot several months ago.  This is her first episode of spotting since that time.  Vaginal exam with cervical motion tenderness, thick brown discharge and concern for STD and likely PID. Patient is afebrile, not tachycardic and without hypertension. She is without nausea or vomiting. Doubt tubo-ovarian abscess.    9:10 PM Wet prep with 2 numerous to count white blood cells.  Will treat for STD, gonorrhea and chlamydia cultures pending. Patient will also be treated with doxycycline for PID.  Pt is well appearing and abd is soft without peritoneal signs on repeat exam.    I have personally reviewed patient's vitals, nursing note and any pertinent labs or imaging.  I performed an  undressed physical exam.    It has been determined that no acute conditions requiring further emergency intervention are present at this time. The patient/guardian have been advised of the diagnosis and plan. I reviewed all labs and imaging including any potential incidental findings. We have discussed signs and symptoms that warrant return to the ED and they are listed in the discharge instructions.    Vital signs are stable at discharge.   BP 111/68 mmHg  Pulse 85  Temp(Src) 98.3 F (36.8 C) (Oral)  Resp 20  SpO2 98%        Dierdre ForthHannah Elois Averitt, PA-C 01/07/15 2127  Jerelyn ScottMartha Linker, MD 01/07/15 2131

## 2015-01-07 NOTE — ED Notes (Signed)
Pelvic Cart set up at bedside. 

## 2015-01-07 NOTE — ED Notes (Signed)
Pt c/o lower abd pain and vaginal spotting starting today; pt sts LMP was in December but sts started birth control in Jan and normally does not have periods

## 2015-01-08 LAB — GC/CHLAMYDIA PROBE AMP (~~LOC~~) NOT AT ARMC
CHLAMYDIA, DNA PROBE: NEGATIVE
NEISSERIA GONORRHEA: NEGATIVE

## 2015-08-08 ENCOUNTER — Emergency Department (HOSPITAL_COMMUNITY)
Admission: EM | Admit: 2015-08-08 | Discharge: 2015-08-08 | Disposition: A | Discharge status: Home / Self Care | Payer: Medicaid Other | Attending: Emergency Medicine | Admitting: Emergency Medicine

## 2015-08-08 ENCOUNTER — Encounter (HOSPITAL_COMMUNITY): Payer: Self-pay | Admitting: Emergency Medicine

## 2015-08-08 DIAGNOSIS — R112 Nausea with vomiting, unspecified: Secondary | ICD-10-CM | POA: Diagnosis not present

## 2015-08-08 DIAGNOSIS — Z792 Long term (current) use of antibiotics: Secondary | ICD-10-CM | POA: Diagnosis not present

## 2015-08-08 DIAGNOSIS — R1013 Epigastric pain: Secondary | ICD-10-CM | POA: Insufficient documentation

## 2015-08-08 DIAGNOSIS — Z862 Personal history of diseases of the blood and blood-forming organs and certain disorders involving the immune mechanism: Secondary | ICD-10-CM | POA: Diagnosis not present

## 2015-08-08 DIAGNOSIS — Z3202 Encounter for pregnancy test, result negative: Secondary | ICD-10-CM | POA: Insufficient documentation

## 2015-08-08 LAB — CBC
HCT: 39.1 % (ref 36.0–46.0)
Hemoglobin: 12.5 g/dL (ref 12.0–15.0)
MCH: 26.8 pg (ref 26.0–34.0)
MCHC: 32 g/dL (ref 30.0–36.0)
MCV: 83.9 fL (ref 78.0–100.0)
PLATELETS: 274 10*3/uL (ref 150–400)
RBC: 4.66 MIL/uL (ref 3.87–5.11)
RDW: 13.9 % (ref 11.5–15.5)
WBC: 6 10*3/uL (ref 4.0–10.5)

## 2015-08-08 LAB — COMPREHENSIVE METABOLIC PANEL
ALK PHOS: 44 U/L (ref 38–126)
ALT: 13 U/L — AB (ref 14–54)
AST: 19 U/L (ref 15–41)
Albumin: 4 g/dL (ref 3.5–5.0)
Anion gap: 9 (ref 5–15)
BUN: 6 mg/dL (ref 6–20)
CALCIUM: 8.9 mg/dL (ref 8.9–10.3)
CHLORIDE: 102 mmol/L (ref 101–111)
CO2: 25 mmol/L (ref 22–32)
CREATININE: 0.89 mg/dL (ref 0.44–1.00)
GFR calc Af Amer: >60 mL/min (ref >60)
GFR calc non Af Amer: >60 mL/min (ref >60)
Glucose, Bld: 99 mg/dL (ref 65–99)
Potassium: 3.8 mmol/L (ref 3.5–5.1)
Sodium: 136 mmol/L (ref 135–145)
Total Bilirubin: 0.7 mg/dL (ref 0.3–1.2)
Total Protein: 7 g/dL (ref 6.5–8.1)

## 2015-08-08 LAB — URINALYSIS, ROUTINE W REFLEX MICROSCOPIC
BILIRUBIN URINE: NEGATIVE
Glucose, UA: NEGATIVE mg/dL
KETONES UR: NEGATIVE mg/dL
Leukocytes, UA: NEGATIVE
Nitrite: NEGATIVE
PH: 6 (ref 5.0–8.0)
Protein, ur: NEGATIVE mg/dL
Specific Gravity, Urine: 1.025 (ref 1.005–1.030)
Urobilinogen, UA: 0.2 mg/dL (ref 0.0–1.0)

## 2015-08-08 LAB — LIPASE, BLOOD: LIPASE: 40 U/L (ref 22–51)

## 2015-08-08 LAB — URINE MICROSCOPIC-ADD ON

## 2015-08-08 LAB — POC URINE PREG, ED: Preg Test, Ur: NEGATIVE

## 2015-08-08 MED ORDER — SODIUM CHLORIDE 0.9 % IV BOLUS (SEPSIS)
1000.0000 mL | Freq: Once | INTRAVENOUS | Status: AC
  Administered 2015-08-08: 1000 mL via INTRAVENOUS

## 2015-08-08 MED ORDER — ONDANSETRON 4 MG PO TBDP: 4.0000 mg | ORAL_TABLET | Freq: Three times a day (TID) | ORAL | Status: DC | PRN

## 2015-08-08 MED ORDER — DICYCLOMINE HCL 20 MG PO TABS: 20.0000 mg | ORAL_TABLET | Freq: Two times a day (BID) | ORAL | Status: DC

## 2015-08-08 MED ORDER — KETOROLAC TROMETHAMINE 30 MG/ML IJ SOLN
30.0000 mg | Freq: Once | INTRAMUSCULAR | Status: AC
  Administered 2015-08-08: 30 mg via INTRAVENOUS
  Filled 2015-08-08: qty 1

## 2015-08-08 MED ORDER — ONDANSETRON HCL 4 MG/2ML IJ SOLN
4.0000 mg | Freq: Once | INTRAMUSCULAR | Status: AC
  Administered 2015-08-08: 4 mg via INTRAVENOUS
  Filled 2015-08-08: qty 2

## 2015-08-08 NOTE — Discharge Instructions (Signed)
Take bentyl as needed for pain. Take zofran as needed for nausea. Refer to attached documents for more information.  °

## 2015-08-08 NOTE — ED Notes (Signed)
PT ambulated with baseline gait; VSS; A&Ox3; no signs of distress; respirations even and unlabored; skin warm and dry; no questions upon discharge.  

## 2015-08-08 NOTE — ED Provider Notes (Signed)
CSN: [id number]     Arrival date & time 08/08/15  0555 History   First MD Initiated Contact with Patient 08/08/15 [phone]     Chief Complaint  Patient presents with  . Abdominal Pain     (Consider location/radiation/quality/duration/timing/severity/associated sxs/prior Treatment) Patient is a 24 y.o. female presenting with abdominal pain. The history is provided by the patient. No language interpreter was used.  Abdominal Pain Pain location:  Epigastric Pain quality: aching and cramping   Pain radiates to:  Does not radiate Pain severity:  Moderate Onset quality:  Gradual Duration:  5 days Timing:  Constant Chronicity:  New Context: not alcohol use, not awakening from sleep, not diet changes, not eating, not laxative use, not medication withdrawal, not previous surgeries, not recent sexual activity, not retching, not sick contacts, not suspicious food intake and not trauma   Relieved by:  Nothing Worsened by:  Nothing tried Ineffective treatments:  None tried Associated symptoms: nausea and vomiting   Risk factors: no alcohol abuse, no aspirin use, not elderly, has not had multiple surgeries, no NSAID use, not obese, not pregnant and no recent hospitalization     Past Medical History  Diagnosis Date  . Seizure (HCC)   . Pregnancy     7 months  . Anemia    Past Surgical History  Procedure Laterality Date  . No past surgeries     Family History  Problem Relation Age of Onset  . Alcohol abuse Neg Hx   . Arthritis Neg Hx   . Asthma Neg Hx   . Birth defects Neg Hx   . Cancer Neg Hx   . COPD Neg Hx   . Depression Neg Hx   . Diabetes Neg Hx   . Drug abuse Neg Hx   . Early death Neg Hx   . Hearing loss Neg Hx   . Heart disease Neg Hx   . Hyperlipidemia Neg Hx   . Hypertension Neg Hx   . Kidney disease Neg Hx   . Learning disabilities Neg Hx   . Mental illness Neg Hx   . Mental retardation Neg Hx   . Miscarriages / Stillbirths Neg Hx   . Stroke Neg Hx   . Vision loss  Neg Hx    Social History  Substance Use Topics  . Smoking status: Never Smoker   . Smokeless tobacco: Never Used  . Alcohol Use: No   OB History    Gravida Para Term Preterm AB TAB SAB Ectopic Multiple Living   Review of Systems  Gastrointestinal: Positive for nausea, vomiting and abdominal pain.  All other systems reviewed and are negative.     Allergies  Review of patient's allergies indicates no known allergies.  Home Medications   Prior to Admission medications   Medication Sig Start Date End Date Taking? Authorizing Provider  doxycycline (VIBRAMYCIN) 100 MG capsule Take 1 capsule (100 mg total) by mouth 2 (two) times daily. 01/07/15   Hannah Muthersbaugh, PA-C   BP 111/73 mmHg  Pulse 80  Temp(Src) 97.7 F (36.5 C) (Oral)  Resp 17  Ht  (1.626 m)  Wt 145 lb (65.772 kg)  BMI 24.88 kg/m2  SpO2 99%  LMP  Physical Exam  Constitutional: She is oriented to person, place, and time. She appears well-developed and well-nourished. No distress.  HENT:  Head: Normocephalic and atraumatic.  Eyes: Conjunctivae and EOM are normal.  Neck:  Normal range of motion.  Cardiovascular: Normal rate and regular rhythm.  Exam reveals no gallop and no friction rub.   No murmur heard. Pulmonary/Chest: Effort normal and breath sounds normal. She has no wheezes. She has no rales. She exhibits no tenderness.  Abdominal: Soft. She exhibits no distension. There is tenderness. There is no rebound.  Epigastric tenderness to palpation. No peritoneal signs. No other focal tenderness to palpation.   Musculoskeletal: Normal range of motion.  Neurological: She is alert and oriented to person, place, and time. Coordination normal.  Speech is goal-oriented. Moves limbs without ataxia.   Skin: Skin is warm and dry.  Psychiatric: She has a normal mood and affect. Her behavior is normal.  Nursing note and vitals reviewed.   ED Course  Procedures (including critical care  time) Labs Review Labs Reviewed  COMPREHENSIVE METABOLIC PANEL - Abnormal; Notable for the following:    ALT 13 (*)    All other components within normal limits  URINALYSIS, ROUTINE W REFLEX MICROSCOPIC (NOT AT [facility]) - Abnormal; Notable for the following:    APPearance CLOUDY (*)    Hgb urine dipstick SMALL (*)    All other components within normal limits  URINE MICROSCOPIC-ADD ON - Abnormal; Notable for the following:    Squamous Epithelial / LPF FEW (*)    Bacteria, UA FEW (*)    All other components within normal limits  LIPASE, BLOOD  CBC  POC URINE PREG, ED    Imaging Review No results found. I have personally reviewed and evaluated these images and lab results as part of my medical decision-making.   EKG Interpretation None      MDM   Final diagnoses:  Epigastric abdominal pain    7:07 AM Labs and urinalysis pending. Vitals stable and patient afebrile. Patient will have toradol, fluids, and zofran.   9:06 AM Patient reports improvement of symptoms. Labs and urinalysis show no acute changes. Patient will be discharged with symptomatic treatment.    Emilia Beck, PA-C 08/08/15 2956  Tomasita Crumble, MD 08/08/15 [phone]

## 2015-08-08 NOTE — ED Notes (Signed)
Pt. reports pain across the abdomen with nausea and vomitting onset Sunday , denies fever or diarrhea .

## 2016-01-07 ENCOUNTER — Encounter (HOSPITAL_BASED_OUTPATIENT_CLINIC_OR_DEPARTMENT_OTHER): Payer: Self-pay | Admitting: Emergency Medicine

## 2016-01-07 ENCOUNTER — Emergency Department (HOSPITAL_BASED_OUTPATIENT_CLINIC_OR_DEPARTMENT_OTHER)
Admission: EM | Admit: 2016-01-07 | Discharge: 2016-01-07 | Disposition: A | Discharge status: Home / Self Care | Payer: Medicaid Other | Attending: Emergency Medicine | Admitting: Emergency Medicine

## 2016-01-07 DIAGNOSIS — R1030 Lower abdominal pain, unspecified: Secondary | ICD-10-CM | POA: Insufficient documentation

## 2016-01-07 DIAGNOSIS — R109 Unspecified abdominal pain: Secondary | ICD-10-CM

## 2016-01-07 LAB — URINALYSIS, ROUTINE W REFLEX MICROSCOPIC
Bilirubin Urine: NEGATIVE
Glucose, UA: NEGATIVE mg/dL
Hgb urine dipstick: NEGATIVE
KETONES UR: NEGATIVE mg/dL
LEUKOCYTES UA: NEGATIVE
NITRITE: NEGATIVE
PROTEIN: NEGATIVE mg/dL
Specific Gravity, Urine: 1.023 (ref 1.005–1.030)
pH: 5.5 (ref 5.0–8.0)

## 2016-01-07 LAB — PREGNANCY, URINE: Preg Test, Ur: POSITIVE — AB

## 2016-01-07 NOTE — ED Provider Notes (Signed)
Based on Triage note, patient checked in for lower abdominal pain. No N/V/D. No UTI. No vaginal Discharge. LMP Dec 31st. G3P3.  When I went to examine the patient, she LWBS. Checked bathrooms for patient, and not present. UA showed no UTI, but had positive pregnancy test.  Called patient at given number x2 and there was no response.     Audry Piliyler Phenix Grein, PA-C 01/08/16 0012  Laurence Spatesachel Morgan Little, MD 01/10/16 [phone]

## 2016-01-07 NOTE — ED Notes (Signed)
Pt reports stabbing sharp pains across the lower abdomen. Denies N/V/D.

## 2016-01-07 NOTE — ED Notes (Signed)
PA went to examine pt and pt was no longer in room. The ED was checked and pt was not present.

## 2016-01-07 NOTE — ED Notes (Signed)
Pt Describes sharp abd pain onset 1430. Denies UTI s/s or vag d/c. No N/V/D. LMP Dec 31st G3P3 to this point.

## 2016-04-06 ENCOUNTER — Inpatient Hospital Stay (HOSPITAL_COMMUNITY)
Admission: AD | Admit: 2016-04-06 | Discharge: 2016-04-06 | Disposition: A | Discharge status: Home / Self Care | Payer: Medicaid Other | Source: Ambulatory Visit | Attending: Obstetrics & Gynecology | Admitting: Obstetrics & Gynecology

## 2016-04-06 ENCOUNTER — Inpatient Hospital Stay (HOSPITAL_COMMUNITY): Payer: Medicaid Other

## 2016-04-06 ENCOUNTER — Encounter (HOSPITAL_COMMUNITY): Payer: Self-pay

## 2016-04-06 DIAGNOSIS — B373 Candidiasis of vulva and vagina: Secondary | ICD-10-CM | POA: Insufficient documentation

## 2016-04-06 DIAGNOSIS — Z3201 Encounter for pregnancy test, result positive: Secondary | ICD-10-CM | POA: Insufficient documentation

## 2016-04-06 DIAGNOSIS — B3731 Acute candidiasis of vulva and vagina: Secondary | ICD-10-CM

## 2016-04-06 DIAGNOSIS — B9689 Other specified bacterial agents as the cause of diseases classified elsewhere: Secondary | ICD-10-CM

## 2016-04-06 DIAGNOSIS — Z3492 Encounter for supervision of normal pregnancy, unspecified, second trimester: Secondary | ICD-10-CM

## 2016-04-06 DIAGNOSIS — N760 Acute vaginitis: Secondary | ICD-10-CM

## 2016-04-06 DIAGNOSIS — R109 Unspecified abdominal pain: Secondary | ICD-10-CM | POA: Diagnosis present

## 2016-04-06 DIAGNOSIS — O9989 Other specified diseases and conditions complicating pregnancy, childbirth and the puerperium: Secondary | ICD-10-CM | POA: Diagnosis not present

## 2016-04-06 DIAGNOSIS — O98812 Other maternal infectious and parasitic diseases complicating pregnancy, second trimester: Secondary | ICD-10-CM | POA: Diagnosis not present

## 2016-04-06 DIAGNOSIS — Z3A17 17 weeks gestation of pregnancy: Secondary | ICD-10-CM | POA: Insufficient documentation

## 2016-04-06 DIAGNOSIS — O23592 Infection of other part of genital tract in pregnancy, second trimester: Secondary | ICD-10-CM | POA: Diagnosis not present

## 2016-04-06 DIAGNOSIS — O26899 Other specified pregnancy related conditions, unspecified trimester: Secondary | ICD-10-CM

## 2016-04-06 LAB — POCT PREGNANCY, URINE: PREG TEST UR: POSITIVE — AB

## 2016-04-06 LAB — CBC
HEMATOCRIT: 33.8 % — AB (ref 36.0–46.0)
HEMOGLOBIN: 11.3 g/dL — AB (ref 12.0–15.0)
MCH: 27.2 pg (ref 26.0–34.0)
MCHC: 33.4 g/dL (ref 30.0–36.0)
MCV: 81.4 fL (ref 78.0–100.0)
PLATELETS: 252 10*3/uL (ref 150–400)
RBC: 4.15 MIL/uL (ref 3.87–5.11)
RDW: 13.5 % (ref 11.5–15.5)
WBC: 9.2 10*3/uL (ref 4.0–10.5)

## 2016-04-06 LAB — URINALYSIS, ROUTINE W REFLEX MICROSCOPIC
Bilirubin Urine: NEGATIVE
Glucose, UA: NEGATIVE mg/dL
Hgb urine dipstick: NEGATIVE
Ketones, ur: NEGATIVE mg/dL
NITRITE: NEGATIVE
Protein, ur: NEGATIVE mg/dL
Specific Gravity, Urine: 1.02 (ref 1.005–1.030)
pH: 6.5 (ref 5.0–8.0)

## 2016-04-06 LAB — URINE MICROSCOPIC-ADD ON: RBC / HPF: NONE SEEN RBC/hpf (ref 0–5)

## 2016-04-06 LAB — WET PREP, GENITAL
SPERM: NONE SEEN
TRICH WET PREP: NONE SEEN

## 2016-04-06 LAB — HCG, QUANTITATIVE, PREGNANCY: hCG, Beta Chain, Quant, S: 61860 m[IU]/mL — ABNORMAL HIGH (ref <5)

## 2016-04-06 MED ORDER — TERCONAZOLE 0.8 % VA CREA: 1.0000 | TOPICAL_CREAM | Freq: Every day | VAGINAL | Status: DC

## 2016-04-06 MED ORDER — METRONIDAZOLE 500 MG PO TABS: 500.0000 mg | ORAL_TABLET | Freq: Two times a day (BID) | ORAL | Status: DC

## 2016-04-06 NOTE — MAU Provider Note (Signed)
History     CSN: [id number]  Arrival date and time: 04/06/16 1025   First Provider Initiated Contact with Patient 04/06/16 1138      Chief Complaint  Patient presents with  . Abdominal Pain  . Possible Pregnancy   HPI ,Deanna Murray is 25 y.o. [id number] She thinks by her LMP she is [redacted]w[redacted]d but U/S shows [redacted]w[redacted]d gestation.Hx of irregular cycles.  Presenting with 2 day history of bilateral lower abdominal pain. Describes pain as intermittent ( reported as constant in Triage), worse with standing and moving.  Better with rest.  She has not tried anything for pain.  Comfortable at this time.   Had + UPT on 6/1.  Has not begun prenatal care.  Denies vaginal discharge or bleeding. Unplanned pregnancy but ok with pregnancy.  Plans care in the clinic, delivered with [place] 2 years ago. She plans  tubal ligation after delivery.     Past Medical History  Diagnosis Date  . Seizure (HCC)   . Pregnancy     7 months  . Anemia     Past Surgical History  Procedure Laterality Date  . No past surgeries      Family History  Problem Relation Age of Onset  . Alcohol abuse Neg Hx   . Arthritis Neg Hx   . Asthma Neg Hx   . Birth defects Neg Hx   . Cancer Neg Hx   . COPD Neg Hx   . Depression Neg Hx   . Diabetes Neg Hx   . Drug abuse Neg Hx   . Early death Neg Hx   . Hearing loss Neg Hx   . Heart disease Neg Hx   . Hyperlipidemia Neg Hx   . Hypertension Neg Hx   . Kidney disease Neg Hx   . Learning disabilities Neg Hx   . Mental illness Neg Hx   . Mental retardation Neg Hx   . Miscarriages / Stillbirths Neg Hx   . Stroke Neg Hx   . Vision loss Neg Hx     Social History  Substance Use Topics  . Smoking status: Never Smoker   . Smokeless tobacco: Never Used  . Alcohol Use: No    Allergies: No Known Allergies  No prescriptions prior to admission    Review of Systems  Constitutional: Negative for fever and chills.  Gastrointestinal: Positive for nausea and abdominal pain (lower  bilateral pain). Negative for vomiting.  Genitourinary: Negative for dysuria, urgency, frequency and hematuria.       Neg for vaginal discharge of bleeding.  Neurological: Negative for headaches.   Physical Exam   Blood pressure 109/62, pulse 111, temperature 98.6 F (37 C), temperature source Oral, resp. rate 16, height 5' 4.5" (1.638 m), weight 154 lb (69.854 kg), last menstrual period 01/21/2016, unknown if currently breastfeeding.  Physical Exam  Nursing note and vitals reviewed. Constitutional: She is oriented to person, place, and time. She appears well-developed and well-nourished. No distress.  HENT:  Head: Normocephalic.  Neck: Normal range of motion.  Respiratory: Effort normal.  GI: Soft. She exhibits no distension and no mass. There is tenderness (bilateral mild tenderness on exam.). There is no rebound and no guarding.  Genitourinary: There is no rash, tenderness or lesion on the right labia. There is no rash, tenderness or lesion on the left labia. Uterus is enlarged (18 week size. nontender). Uterus is not deviated, not fixed and not tender. Cervix exhibits no motion tenderness, no discharge and no friability.  Right adnexum displays no mass, no tenderness and no fullness. Left adnexum displays no mass, no tenderness and no fullness. No tenderness or bleeding in the vagina. No foreign body around the vagina. Vaginal discharge (large amount of white, frothy, malodorous discharge) found.  Neurological: She is alert and oriented to person, place, and time.  Skin: Skin is warm and dry.  Psychiatric: She has a normal mood and affect. Her behavior is normal.   MAU Course  Procedures   Results for orders placed or performed during the hospital encounter of 04/06/16 (from the past 24 hour(s))  Urinalysis, Routine w reflex microscopic (not [facility])     Status: Abnormal   Collection Time: 04/06/16 10:50 AM  Result Value Ref Range   Color, Urine YELLOW YELLOW   APPearance CLOUDY  (A) CLEAR   Specific Gravity, Urine 1.020 1.005 - 1.030   pH 6.5 5.0 - 8.0   Glucose, UA NEGATIVE NEGATIVE mg/dL   Hgb urine dipstick NEGATIVE NEGATIVE   Bilirubin Urine NEGATIVE NEGATIVE   Ketones, ur NEGATIVE NEGATIVE mg/dL   Protein, ur NEGATIVE NEGATIVE mg/dL   Nitrite NEGATIVE NEGATIVE   Leukocytes, UA MODERATE (A) NEGATIVE  Urine microscopic-add on     Status: Abnormal   Collection Time: 04/06/16 10:50 AM  Result Value Ref Range   Squamous Epithelial / LPF 6-30 (A) NONE SEEN   WBC, UA 6-30 0 - 5 WBC/hpf   RBC / HPF NONE SEEN 0 - 5 RBC/hpf   Bacteria, UA FEW (A) NONE SEEN  Pregnancy, urine POC     Status: Abnormal   Collection Time: 04/06/16 10:57 AM  Result Value Ref Range   Preg Test, Ur POSITIVE (A) NEGATIVE  CBC     Status: Abnormal   Collection Time: 04/06/16 12:24 PM  Result Value Ref Range   WBC 9.2 4.0 - 10.5 K/uL   RBC 4.15 3.87 - 5.11 MIL/uL   Hemoglobin 11.3 (L) 12.0 - 15.0 g/dL   HCT 16.133.8 (L) 09.636.0 - 04.546.0 %   MCV 81.4 78.0 - 100.0 fL   MCH 27.2 26.0 - 34.0 pg   MCHC 33.4 30.0 - 36.0 g/dL   RDW 40.913.5 81.111.5 - 91.415.5 %   Platelets 252 150 - 400 K/uL  hCG, quantitative, pregnancy     Status: Abnormal   Collection Time: 04/06/16 12:24 PM  Result Value Ref Range   hCG, Beta Chain, Quant, S [id number] (H) <5 mIU/mL  Wet prep, genital     Status: Abnormal   Collection Time: 04/06/16  1:20 PM  Result Value Ref Range   Yeast Wet Prep HPF POC PRESENT (A) NONE SEEN   Trich, Wet Prep NONE SEEN NONE SEEN   Clue Cells Wet Prep HPF POC PRESENT (A) NONE SEEN   WBC, Wet Prep HPF POC FEW (A) NONE SEEN   Sperm NONE SEEN    MDM:  MSE Labs U/S Exam  Assessment and Plan  A:  Lower bilateral abdominal pain in first trimester pregnancy      [id number] viable IUP by U/S evaluation      Candidiasis of vagina      Bacterial vaginosis      Round ligament pain  P: Rx for Flagyl and Terazol 3 vag cream sent to pharmacy     Begin prenatal care with the Clinic     Begin prenatal  vitamins daily     May take tylenol for tenderness     D/C educational material on Yeast, BV, 2nd  trimester pregnancy and round ligament pain     [name],EVE M 04/06/2016, 1:55 PM

## 2016-04-06 NOTE — MAU Note (Signed)
Having real bad abdominal pain, for the past 2 days (lower abd). +HPT on 6/1

## 2016-04-06 NOTE — Discharge Instructions (Signed)
Monilial Vaginitis Vaginitis in a soreness, swelling and redness (inflammation) of the vagina and vulva. Monilial vaginitis is not a sexually transmitted infection. CAUSES  Yeast vaginitis is caused by yeast (candida) that is normally found in your vagina. With a yeast infection, the candida has overgrown in number to a point that upsets the chemical balance. SYMPTOMS   White, thick vaginal discharge.  Swelling, itching, redness and irritation of the vagina and possibly the lips of the vagina (vulva).  Burning or painful urination.  Painful intercourse. DIAGNOSIS  Things that may contribute to monilial vaginitis are:  Postmenopausal and virginal states.  Pregnancy.  Infections.  Being tired, sick or stressed, especially if you had monilial vaginitis in the past.  Diabetes. Good control will help lower the chance.  Birth control pills.  Tight fitting garments.  Using bubble bath, feminine sprays, douches or deodorant tampons.  Taking certain medications that kill germs (antibiotics).  Sporadic recurrence can occur if you become ill. TREATMENT  Your caregiver will give you medication.  There are several kinds of anti monilial vaginal creams and suppositories specific for monilial vaginitis. For recurrent yeast infections, use a suppository or cream in the vagina 2 times a week, or as directed.  Anti-monilial or steroid cream for the itching or irritation of the vulva may also be used. Get your caregiver's permission.  Painting the vagina with methylene blue solution may help if the monilial cream does not work.  Eating yogurt may help prevent monilial vaginitis. HOME CARE INSTRUCTIONS   Finish all medication as prescribed.  Do not have sex until treatment is completed or after your caregiver tells you it is okay.  Take warm sitz baths.  Do not douche.  Do not use tampons, especially scented ones.  Wear cotton underwear.  Avoid tight pants and panty  hose.  Tell your sexual partner that you have a yeast infection. They should go to their caregiver if they have symptoms such as mild rash or itching.  Your sexual partner should be treated as well if your infection is difficult to eliminate.  Practice safer sex. Use condoms.  Some vaginal medications cause latex condoms to fail. Vaginal medications that harm condoms are:  Cleocin cream.  Butoconazole (Femstat).  Terconazole (Terazol) vaginal suppository.  Miconazole (Monistat) (may be purchased over the counter). SEEK MEDICAL CARE IF:   You have a temperature by mouth above 102 F (38.9 C).  The infection is getting worse after 2 days of treatment.  The infection is not getting better after 3 days of treatment.  You develop blisters in or around your vagina.  You develop vaginal bleeding, and it is not your menstrual period.  You have pain when you urinate.  You develop intestinal problems.  You have pain with sexual intercourse.   This information is not intended to replace advice given to you by your health care provider. Make sure you discuss any questions you have with your health care provider.   Document Released: 07/29/2005 Document Revised: 01/11/2012 Document Reviewed: 04/22/2015 Elsevier Interactive Patient Education 2016 Elsevier Inc. Bacterial Vaginosis Bacterial vaginosis is an infection of the vagina. It happens when too many germs (bacteria) grow in the vagina. Having this infection puts you at risk for getting other infections from sex. Treating this infection can help lower your risk for other infections, such as:   Chlamydia.  Gonorrhea.  HIV.  Herpes. HOME CARE  Take your medicine as told by your doctor.  Finish your medicine even if you  start to feel better.  Tell your sex partner that you have an infection. They should see their doctor for treatment.  During treatment:  Avoid sex or use condoms correctly.  Do not douche.  Do  not drink alcohol unless your doctor tells you it is ok.  Do not breastfeed unless your doctor tells you it is ok. GET HELP IF:  You are not getting better after 3 days of treatment.  You have more grey fluid (discharge) coming from your vagina than before.  You have more pain than before.  You have a fever. MAKE SURE YOU:   Understand these instructions.  Will watch your condition.  Will get help right away if you are not doing well or get worse.   This information is not intended to replace advice given to you by your health care provider. Make sure you discuss any questions you have with your health care provider.   Document Released: 07/28/2008 Document Revised: 11/09/2014 Document Reviewed: 05/31/2013 Elsevier Interactive Patient Education 2016 [employer] Second Trimester of Pregnancy The second trimester is from week 13 through week 28, month 4 through 6. This is often the time in pregnancy that you feel your best. Often times, morning sickness has lessened or quit. You may have more energy, and you may get hungry more often. Your unborn baby (fetus) is growing rapidly. At the end of the sixth month, he or she is about 9 inches long and weighs about 1 pounds. You will likely feel the baby move (quickening) between 18 and 20 weeks of pregnancy. HOME CARE   Avoid all smoking, herbs, and alcohol. Avoid drugs not approved by your doctor.  Do not use any tobacco products, including cigarettes, chewing tobacco, and electronic cigarettes. If you need help quitting, ask your doctor. You may get counseling or other support to help you quit.  Only take medicine as told by your doctor. Some medicines are safe and some are not during pregnancy.  Exercise only as told by your doctor. Stop exercising if you start having cramps.  Eat regular, healthy meals.  Wear a good support bra if your breasts are tender.  Do not use hot tubs, steam rooms, or saunas.  Wear your seat belt  when driving.  Avoid raw meat, uncooked cheese, and liter boxes and soil used by cats.  Take your prenatal vitamins.  Take 1500-2000 milligrams of calcium daily starting at the 20th week of pregnancy until you deliver your baby.  Try taking medicine that helps you poop (stool softener) as needed, and if your doctor approves. Eat more fiber by eating fresh fruit, vegetables, and whole grains. Drink enough fluids to keep your pee (urine) clear or pale yellow.  Take warm water baths (sitz baths) to soothe pain or discomfort caused by hemorrhoids. Use hemorrhoid cream if your doctor approves.  If you have puffy, bulging veins (varicose veins), wear support hose. Raise (elevate) your feet for 15 minutes, 3-4 times a day. Limit salt in your diet.  Avoid heavy lifting, wear low heals, and sit up straight.  Rest with your legs raised if you have leg cramps or low back pain.  Visit your dentist if you have not gone during your pregnancy. Use a soft toothbrush to brush your teeth. Be gentle when you floss.  You can have sex (intercourse) unless your doctor tells you not to.  Go to your doctor visits. GET HELP IF:   You feel dizzy.  You have mild cramps or pressure  in your lower belly (abdomen).  You have a nagging pain in your belly area.  You continue to feel sick to your stomach (nauseous), throw up (vomit), or have watery poop (diarrhea).  You have bad smelling fluid coming from your vagina.  You have pain with peeing (urination). GET HELP RIGHT AWAY IF:   You have a fever.  You are leaking fluid from your vagina.  You have spotting or bleeding from your vagina.  You have severe belly cramping or pain.  You lose or gain weight rapidly.  You have trouble catching your breath and have chest pain.  You notice sudden or extreme puffiness (swelling) of your face, hands, ankles, feet, or legs.  You have not felt the baby move in over an hour.  You have severe headaches that  do not go away with medicine.  You have vision changes.   This information is not intended to replace advice given to you by your health care provider. Make sure you discuss any questions you have with your health care provider.   Document Released: 01/13/2010 Document Revised: 11/09/2014 Document Reviewed: 12/20/2012 Elsevier Interactive Patient Education 2016 Elsevier Inc. Round Ligament Pain The round ligament is a cord of muscle and tissue that helps to support the uterus. It can become a source of pain during pregnancy if it becomes stretched or twisted as the baby grows. The pain usually begins in the second trimester of pregnancy, and it can come and go until the baby is delivered. It is not a serious problem, and it does not cause harm to the baby. Round ligament pain is usually a short, sharp, and pinching pain, but it can also be a dull, lingering, and aching pain. The pain is felt in the lower side of the abdomen or in the groin. It usually starts deep in the groin and moves up to the outside of the hip area. Pain can occur with:  A sudden change in position.  Rolling over in bed.  Coughing or sneezing.  Physical activity. HOME CARE INSTRUCTIONS Watch your condition for any changes. Take these steps to help with your pain:  When the pain starts, relax. Then try:  Sitting down.  Flexing your knees up to your abdomen.  Lying on your side with one pillow under your abdomen and another pillow between your legs.  Sitting in a warm bath for 15-20 minutes or until the pain goes away.  Take over-the-counter and prescription medicines only as told by your health care provider.  Move slowly when you sit and stand.  Avoid long walks if they cause pain.  Stop or lessen your physical activities if they cause pain. SEEK MEDICAL CARE IF:  Your pain does not go away with treatment.  You feel pain in your back that you did not have before.  Your medicine is not helping. SEEK  IMMEDIATE MEDICAL CARE IF:  You develop a fever or chills.  You develop uterine contractions.  You develop vaginal bleeding.  You develop nausea or vomiting.  You develop diarrhea.  You have pain when you urinate.   This information is not intended to replace advice given to you by your health care provider. Make sure you discuss any questions you have with your health care provider.   Document Released: 07/28/2008 Document Revised: 01/11/2012 Document Reviewed: 12/26/2014 Elsevier Interactive Patient Education [employer]

## 2016-04-07 ENCOUNTER — Telehealth (HOSPITAL_COMMUNITY): Payer: Self-pay | Admitting: *Deleted

## 2016-04-07 DIAGNOSIS — A749 Chlamydial infection, unspecified: Secondary | ICD-10-CM

## 2016-04-07 DIAGNOSIS — O98812 Other maternal infectious and parasitic diseases complicating pregnancy, second trimester: Principal | ICD-10-CM

## 2016-04-07 LAB — URINE CULTURE: Special Requests: NORMAL

## 2016-04-07 LAB — GC/CHLAMYDIA PROBE AMP (~~LOC~~) NOT AT ARMC
Chlamydia: POSITIVE — AB
Neisseria Gonorrhea: NEGATIVE

## 2016-04-07 LAB — HIV ANTIBODY (ROUTINE TESTING W REFLEX): HIV SCREEN 4TH GENERATION: NONREACTIVE

## 2016-04-07 MED ORDER — AZITHROMYCIN 500 MG PO TABS: ORAL_TABLET | ORAL | Status: DC

## 2016-04-07 NOTE — Telephone Encounter (Signed)
Patient returned call, notified her of positive chlamydia culture.  Rx routed to pharmacy per protocol.  Instructed patient to notify her partner for treatment and to abstain from sex for seven days post treatment.  Report of treatment faxed to health department.  

## 2016-04-24 ENCOUNTER — Inpatient Hospital Stay (HOSPITAL_COMMUNITY)
Admission: AD | Admit: 2016-04-24 | Discharge: 2016-04-24 | Disposition: A | Discharge status: Home / Self Care | Payer: Medicaid Other | Source: Ambulatory Visit | Attending: Family Medicine | Admitting: Family Medicine

## 2016-04-24 ENCOUNTER — Other Ambulatory Visit: Payer: Self-pay

## 2016-04-24 ENCOUNTER — Encounter (HOSPITAL_COMMUNITY): Payer: Self-pay | Admitting: *Deleted

## 2016-04-24 ENCOUNTER — Inpatient Hospital Stay (HOSPITAL_COMMUNITY): Payer: Medicaid Other

## 2016-04-24 DIAGNOSIS — O26899 Other specified pregnancy related conditions, unspecified trimester: Secondary | ICD-10-CM

## 2016-04-24 DIAGNOSIS — F319 Bipolar disorder, unspecified: Secondary | ICD-10-CM | POA: Insufficient documentation

## 2016-04-24 DIAGNOSIS — O99342 Other mental disorders complicating pregnancy, second trimester: Secondary | ICD-10-CM | POA: Diagnosis not present

## 2016-04-24 DIAGNOSIS — O4692 Antepartum hemorrhage, unspecified, second trimester: Secondary | ICD-10-CM | POA: Diagnosis not present

## 2016-04-24 DIAGNOSIS — O26892 Other specified pregnancy related conditions, second trimester: Secondary | ICD-10-CM | POA: Insufficient documentation

## 2016-04-24 DIAGNOSIS — O368120 Decreased fetal movements, second trimester, not applicable or unspecified: Secondary | ICD-10-CM | POA: Diagnosis not present

## 2016-04-24 DIAGNOSIS — N939 Abnormal uterine and vaginal bleeding, unspecified: Secondary | ICD-10-CM

## 2016-04-24 DIAGNOSIS — O9989 Other specified diseases and conditions complicating pregnancy, childbirth and the puerperium: Secondary | ICD-10-CM | POA: Diagnosis not present

## 2016-04-24 DIAGNOSIS — Z3689 Encounter for other specified antenatal screening: Secondary | ICD-10-CM

## 2016-04-24 DIAGNOSIS — R109 Unspecified abdominal pain: Secondary | ICD-10-CM | POA: Diagnosis present

## 2016-04-24 DIAGNOSIS — Z3A20 20 weeks gestation of pregnancy: Secondary | ICD-10-CM

## 2016-04-24 HISTORY — DX: Unspecified pre-eclampsia, unspecified trimester: O14.90

## 2016-04-24 HISTORY — DX: Gonococcal infection, unspecified: A54.9

## 2016-04-24 HISTORY — DX: Major depressive disorder, single episode, unspecified: F32.9

## 2016-04-24 HISTORY — DX: Bipolar disorder, unspecified: F31.9

## 2016-04-24 LAB — URINALYSIS, ROUTINE W REFLEX MICROSCOPIC
Bilirubin Urine: NEGATIVE
GLUCOSE, UA: NEGATIVE mg/dL
Hgb urine dipstick: NEGATIVE
KETONES UR: NEGATIVE mg/dL
Nitrite: NEGATIVE
PH: 6 (ref 5.0–8.0)
Protein, ur: NEGATIVE mg/dL
SPECIFIC GRAVITY, URINE: 1.02 (ref 1.005–1.030)

## 2016-04-24 LAB — GLUCOSE, CAPILLARY
GLUCOSE-CAPILLARY: 57 mg/dL — AB (ref 65–99)
Glucose-Capillary: 80 mg/dL (ref 65–99)

## 2016-04-24 LAB — CBC WITH DIFFERENTIAL/PLATELET
Basophils Absolute: 0 10*3/uL (ref 0.0–0.1)
Basophils Relative: 0 %
EOS ABS: 0.1 10*3/uL (ref 0.0–0.7)
EOS PCT: 1 %
HCT: 30.6 % — ABNORMAL LOW (ref 36.0–46.0)
Hemoglobin: 9.9 g/dL — ABNORMAL LOW (ref 12.0–15.0)
LYMPHS ABS: 1.5 10*3/uL (ref 0.7–4.0)
LYMPHS PCT: 21 %
MCH: 26.8 pg (ref 26.0–34.0)
MCHC: 32.4 g/dL (ref 30.0–36.0)
MCV: 82.7 fL (ref 78.0–100.0)
MONO ABS: 0.3 10*3/uL (ref 0.1–1.0)
MONOS PCT: 4 %
Neutro Abs: 5.2 10*3/uL (ref 1.7–7.7)
Neutrophils Relative %: 74 %
PLATELETS: 229 10*3/uL (ref 150–400)
RBC: 3.7 MIL/uL — AB (ref 3.87–5.11)
RDW: 13.8 % (ref 11.5–15.5)
WBC: 7 10*3/uL (ref 4.0–10.5)

## 2016-04-24 LAB — URINE MICROSCOPIC-ADD ON: RBC / HPF: NONE SEEN RBC/hpf (ref 0–5)

## 2016-04-24 LAB — WET PREP, GENITAL
CLUE CELLS WET PREP: NONE SEEN
Sperm: NONE SEEN
TRICH WET PREP: NONE SEEN
YEAST WET PREP: NONE SEEN

## 2016-04-24 MED ORDER — ACETAMINOPHEN 325 MG PO TABS
650.0000 mg | ORAL_TABLET | Freq: Once | ORAL | Status: AC
  Administered 2016-04-24: 650 mg via ORAL
  Filled 2016-04-24: qty 2

## 2016-04-24 MED ORDER — ONDANSETRON HCL 4 MG/2ML IJ SOLN
4.0000 mg | Freq: Once | INTRAMUSCULAR | Status: AC
  Administered 2016-04-24: 4 mg via INTRAVENOUS
  Filled 2016-04-24: qty 2

## 2016-04-24 MED ORDER — LACTATED RINGERS IV BOLUS (SEPSIS)
1000.0000 mL | Freq: Once | INTRAVENOUS | Status: AC
  Administered 2016-04-24: 1000 mL via INTRAVENOUS

## 2016-04-24 MED ORDER — FAMOTIDINE IN NACL 20-0.9 MG/50ML-% IV SOLN
20.0000 mg | Freq: Once | INTRAVENOUS | Status: AC
  Administered 2016-04-24: 20 mg via INTRAVENOUS
  Filled 2016-04-24: qty 50

## 2016-04-24 MED ORDER — PRENATAL VITAMIN 27-0.8 MG PO TABS: 1.0000 | ORAL_TABLET | Freq: Every day | ORAL | Status: DC
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
## 2016-04-24 NOTE — Progress Notes (Signed)
Dr. Alanda SlimGonfa notified that pt came to MAU for decreased fetal movement, lower abdominal cramping, and vaginal bleeding.  Provider states he will come see pt.

## 2016-04-24 NOTE — MAU Note (Signed)
RN entered the room to check on pt.  Pt found lying on her stomach in the floor behind the door.  RN called for assistance and nurse techs and nurses came to the room.  MAU provider was notified and came to the pt's room.  Pt not verbally responsive until ammonia was used.  Pt was carefully moved to the bed.  Pt showed no visual signs of injury and just stated, "I feel bad."  When asked pt states she did not hit her head or belly.  Provider states to get orthostatic vital signs, CBG, start an IV with an LR bolus, and an EKG.

## 2016-04-24 NOTE — MAU Note (Signed)
After finding out the pt does not want to go home because she lives in a shelter and does not have access to food the [facility] social [facility] was attempted to be contacted.  Since we were unable to contact the [facility] the RN reached out to [facility].  [facility] is going to fax over information about different food pantries the pt can go to and get food.

## 2016-04-24 NOTE — Discharge Instructions (Signed)
We have schedule you an appointment to start your prenatal care on [facility]. It is important that you keep this appointment.  Start the prenatal vitamins.

## 2016-04-24 NOTE — MAU Note (Signed)
Pt states she has been having real bad pain that she describes as lower abdominal cramping.  Pt states she has not felt the baby move in two days and was feeling the baby move regularly before that.

## 2016-04-25 LAB — HIV ANTIBODY (ROUTINE TESTING W REFLEX): HIV Screen 4th Generation wRfx: NONREACTIVE

## 2016-04-27 ENCOUNTER — Ambulatory Visit (INDEPENDENT_AMBULATORY_CARE_PROVIDER_SITE_OTHER): Payer: Medicaid Other | Admitting: Obstetrics and Gynecology

## 2016-04-27 ENCOUNTER — Other Ambulatory Visit (HOSPITAL_COMMUNITY)
Admission: RE | Admit: 2016-04-27 | Discharge: 2016-04-27 | Disposition: A | Discharge status: Home / Self Care | Payer: Medicaid Other | Source: Ambulatory Visit | Attending: Obstetrics and Gynecology | Admitting: Obstetrics and Gynecology

## 2016-04-27 ENCOUNTER — Encounter: Payer: Self-pay | Admitting: Obstetrics and Gynecology

## 2016-04-27 VITALS — BP 105/63 | HR 73 | Temp 97.6°F | Wt 155.3 lb

## 2016-04-27 DIAGNOSIS — Z113 Encounter for screening for infections with a predominantly sexual mode of transmission: Secondary | ICD-10-CM | POA: Diagnosis not present

## 2016-04-27 DIAGNOSIS — F3170 Bipolar disorder, currently in remission, most recent episode unspecified: Secondary | ICD-10-CM

## 2016-04-27 DIAGNOSIS — G40909 Epilepsy, unspecified, not intractable, without status epilepticus: Secondary | ICD-10-CM | POA: Insufficient documentation

## 2016-04-27 DIAGNOSIS — Z3490 Encounter for supervision of normal pregnancy, unspecified, unspecified trimester: Secondary | ICD-10-CM | POA: Diagnosis present

## 2016-04-27 DIAGNOSIS — F319 Bipolar disorder, unspecified: Secondary | ICD-10-CM | POA: Insufficient documentation

## 2016-04-27 LAB — POCT URINALYSIS DIP (DEVICE)
Bilirubin Urine: NEGATIVE
Bilirubin Urine: NEGATIVE
Glucose, UA: NEGATIVE mg/dL
Glucose, UA: NEGATIVE mg/dL
HGB URINE DIPSTICK: NEGATIVE
HGB URINE DIPSTICK: NEGATIVE
Ketones, ur: 40 mg/dL — AB
Ketones, ur: 40 mg/dL — AB
NITRITE: NEGATIVE
Nitrite: NEGATIVE
PH: 7 (ref 5.0–8.0)
PROTEIN: NEGATIVE mg/dL
Protein, ur: NEGATIVE mg/dL
SPECIFIC GRAVITY, URINE: 1.015 (ref 1.005–1.030)
Specific Gravity, Urine: 1.025 (ref 1.005–1.030)
UROBILINOGEN UA: 0.2 mg/dL (ref 0.0–1.0)
UROBILINOGEN UA: 0.2 mg/dL (ref 0.0–1.0)
pH: 7.5 (ref 5.0–8.0)

## 2016-04-27 MED ORDER — ASPIRIN EC 81 MG PO TBEC: 81.0000 mg | DELAYED_RELEASE_TABLET | Freq: Every day | ORAL | Status: DC

## 2016-04-27 NOTE — Progress Notes (Signed)
Here for initial prenatal visit. Given new patient education packet.  

## 2016-04-27 NOTE — Progress Notes (Signed)
Subjective:  Deanna Murray is a 25 y.o. [phone] at 3498w3d being seen today for initial prenatal care.  She is currently monitored for the following issues for this low-risk pregnancy and has Abnormal Pap smear; Iron deficiency anemia; Seizure disorder (HCC); Bipolar disorder (HCC); and Supervision of low-risk pregnancy on her problem list.  Patient reports no complaints.  Contractions: Not present. Vag. Bleeding: None.  Movement: Present. Denies leaking of fluid.   The following portions of the patient's history were reviewed and updated as appropriate: allergies, current medications, past family history, past medical history, past social history, past surgical history and problem list. Problem list updated.  Objective:   Filed Vitals:   04/27/16 1325  BP: 105/63  Pulse: 73  Temp: 97.6 F (36.4 C)  Weight: 155 lb 4.8 oz (70.444 kg)    Fetal Status: Fetal Heart Rate (bpm): 140   Movement: Present     General:  Alert, oriented and cooperative. Patient is in no acute distress.  Skin: Skin is warm and dry. No rash noted.   Cardiovascular: Normal heart rate noted  Respiratory: Normal respiratory effort, no problems with respiration noted  Abdomen: Soft, gravid, appropriate for gestational age. Pain/Pressure: Absent     Pelvic: Cervical exam deferred        Extremities: Normal range of motion.  Edema: Trace  Mental Status: Normal mood and affect. Normal behavior. Normal judgment and thought content.   Urinalysis:      Assessment and Plan:  Pregnancy: G4P3003 at 9098w3d  1. Bipolar affective disorder in remission (HCC) - controlled off meds, has psychiatrist  2. Supervision of low-risk pregnancy, unspecified trimester - Prenatal Profile - GC/Chlamydia probe amp ([place] - AFP, Quad Screen - Culture, OB Urine - [id number] PDM PROFILE - Hemoglobinopathy Evaluation - Ferritin - Cytology - PAP - Comprehensive metabolic panel - Protein / creatinine ratio, urine  #  chlamydia - 3 wks ago. toc today. Condoms always  # Asc-h pap - no colpo, but repeat pap performed last year - requesting records  # hx preE  - start asa; cmp, upc  # anemia - cbc, ferritin, iron if indicated  Preterm labor symptoms and general obstetric precautions including but not limited to vaginal bleeding, contractions, leaking of fluid and fetal movement were reviewed in detail with the patient. Please refer to After Visit Summary for other counseling recommendations.    Kathrynn RunningNoah Bedford Leotha Voeltz, MD

## 2016-04-28 LAB — PRENATAL PROFILE (SOLSTAS)
Antibody Screen: NEGATIVE
BASOS ABS: 0 {cells}/uL (ref 0–200)
Basophils Relative: 0 %
EOS ABS: 172 {cells}/uL (ref 15–500)
Eosinophils Relative: 2 %
HCT: 35.4 % (ref 35.0–45.0)
HEP B S AG: NEGATIVE
HIV: NONREACTIVE
Hemoglobin: 11.3 g/dL — ABNORMAL LOW (ref 11.7–15.5)
LYMPHS ABS: 1634 {cells}/uL (ref 850–3900)
Lymphocytes Relative: 19 %
MCH: 27 pg (ref 27.0–33.0)
MCHC: 31.9 g/dL — AB (ref 32.0–36.0)
MCV: 84.5 fL (ref 80.0–100.0)
MONO ABS: 344 {cells}/uL (ref 200–950)
MPV: 11.1 fL (ref 7.5–12.5)
Monocytes Relative: 4 %
NEUTROS ABS: 6450 {cells}/uL (ref 1500–7800)
Neutrophils Relative %: 75 %
Platelets: 281 10*3/uL (ref 140–400)
RBC: 4.19 MIL/uL (ref 3.80–5.10)
RDW: 13.9 % (ref 11.0–15.0)
RUBELLA: 1.89 {index} — AB (ref <0.90)
Rh Type: POSITIVE
WBC: 8.6 10*3/uL (ref 3.8–10.8)

## 2016-04-28 LAB — COMPREHENSIVE METABOLIC PANEL
ALBUMIN: 3.7 g/dL (ref 3.6–5.1)
ALK PHOS: 44 U/L (ref 33–115)
ALT: 5 U/L — AB (ref 6–29)
AST: 11 U/L (ref 10–30)
BILIRUBIN TOTAL: 0.4 mg/dL (ref 0.2–1.2)
BUN: 6 mg/dL — ABNORMAL LOW (ref 7–25)
CO2: 24 mmol/L (ref 20–31)
CREATININE: 0.58 mg/dL (ref 0.50–1.10)
Calcium: 9.4 mg/dL (ref 8.6–10.2)
Chloride: 101 mmol/L (ref 98–110)
Glucose, Bld: 68 mg/dL (ref 65–99)
Potassium: 4.5 mmol/L (ref 3.5–5.3)
SODIUM: 136 mmol/L (ref 135–146)
TOTAL PROTEIN: 6.9 g/dL (ref 6.1–8.1)

## 2016-04-28 LAB — CULTURE, OB URINE

## 2016-04-28 LAB — PROTEIN / CREATININE RATIO, URINE
CREATININE, URINE: 117 mg/dL (ref 20–320)
Protein Creatinine Ratio: 111 mg/g creat (ref 21–161)
TOTAL PROTEIN, URINE: 13 mg/dL (ref 5–24)

## 2016-04-28 LAB — FERRITIN: Ferritin: 22 ng/mL (ref 10–154)

## 2016-04-28 LAB — GC/CHLAMYDIA PROBE AMP (~~LOC~~) NOT AT ARMC
Chlamydia: NEGATIVE
Neisseria Gonorrhea: NEGATIVE

## 2016-04-29 LAB — HEMOGLOBINOPATHY EVALUATION
HCT: 35.4 % (ref 35.0–45.0)
HEMOGLOBIN: 11.3 g/dL — AB (ref 11.7–15.5)
Hgb A2 Quant: 2.7 % (ref 1.8–3.5)
Hgb A: 96.3 % (ref >96.0)
Hgb F Quant: <1 % (ref <2.0)
MCH: 27 pg (ref 27.0–33.0)
MCV: 84.5 fL (ref 80.0–100.0)
RBC: 4.19 MIL/uL (ref 3.80–5.10)
RDW: 13.9 % (ref 11.0–15.0)

## 2016-05-01 LAB — AFP, QUAD SCREEN
AFP: 86.7 ng/mL
Curr Gest Age: 20.4 weeks
HCG TOTAL: 54.49 [IU]/mL
INH: 289.5 pg/mL
INTERPRETATION-AFP: NEGATIVE
MOM FOR HCG: 2.61
MoM for AFP: 1.3
MoM for INH: 1.6
Open Spina bifida: NEGATIVE
Osb Risk: 1:10000 {titer}
TRI 18 SCR RISK EST: NEGATIVE
UE3 MOM: 1.29
uE3 Value: 2.51 ng/mL

## 2016-05-02 ENCOUNTER — Encounter: Payer: Self-pay | Admitting: Obstetrics and Gynecology

## 2016-05-02 DIAGNOSIS — F1390 Sedative, hypnotic, or anxiolytic use, unspecified, uncomplicated: Secondary | ICD-10-CM | POA: Insufficient documentation

## 2016-05-02 DIAGNOSIS — F1310 Sedative, hypnotic or anxiolytic abuse, uncomplicated: Secondary | ICD-10-CM

## 2016-05-02 LAB — CP5000051 PDM PROFILE
ALPHAHYDROXYALPRAZOLAM: 30 ng/mL — AB (ref <25)
AMPHETAMINES: NEGATIVE ng/mL (ref <500)
Alphahydroxymidazolam: NEGATIVE ng/mL (ref <50)
Alphahydroxytriazolam: NEGATIVE ng/mL (ref <50)
Aminoclonazepam: NEGATIVE ng/mL (ref <25)
BARBITURATES: NEGATIVE ng/mL (ref <300)
BENZODIAZEPINES: POSITIVE ng/mL — AB (ref <100)
BUPRENORPHINE: NEGATIVE ng/mL (ref <5)
Cocaine Metabolite: NEGATIVE ng/mL (ref <150)
Desmethyltramadol: NEGATIVE ng/mL (ref <100)
FENTANYL: NEGATIVE ng/mL (ref <0.5)
HYDROXYETHYLFLURAZEPAM: NEGATIVE ng/mL (ref <50)
LORAZEPAM: NEGATIVE ng/mL (ref <50)
MDMA: NEGATIVE ng/mL (ref <500)
MEPROBAMATE: NEGATIVE ng/mL (ref <1000)
METHADONE METABOLITE: NEGATIVE ng/mL (ref <100)
Marijuana Metabolite: NEGATIVE ng/mL (ref <20)
Meperidine: NEGATIVE ng/mL (ref <100)
NORDIAZEPAM: NEGATIVE ng/mL (ref <50)
NORFENTANYL: NEGATIVE ng/mL (ref <0.5)
NORTAPENTADOL: NEGATIVE ng/mL (ref <50)
Normeperidine: NEGATIVE ng/mL (ref <100)
OPIATES: NEGATIVE ng/mL (ref <100)
Oxazepam: NEGATIVE ng/mL (ref <50)
Oxycodone: NEGATIVE ng/mL (ref <100)
PHENCYCLIDINE: NEGATIVE ng/mL (ref <25)
Propoxyphene: NEGATIVE ng/mL (ref <300)
TRAMADOL: NEGATIVE ng/mL (ref <100)
Tapentadol: NEGATIVE ng/mL (ref <50)
Temazepam: NEGATIVE ng/mL (ref <50)
ZOLPIDEM METABOLITE: NEGATIVE ng/mL (ref <5)
ZOLPIDEM: NEGATIVE ng/mL (ref <5)

## 2016-05-07 ENCOUNTER — Encounter: Payer: Medicaid Other | Admitting: Obstetrics and Gynecology

## 2016-05-26 ENCOUNTER — Encounter: Payer: Medicaid Other | Admitting: Student

## 2016-06-01 ENCOUNTER — Encounter: Payer: Medicaid Other | Admitting: Family Medicine

## 2016-06-13 ENCOUNTER — Inpatient Hospital Stay (HOSPITAL_COMMUNITY)
Admission: AD | Admit: 2016-06-13 | Discharge: 2016-06-13 | Disposition: A | Discharge status: Home / Self Care | Payer: Medicaid Other | Source: Ambulatory Visit | Attending: Obstetrics & Gynecology | Admitting: Obstetrics & Gynecology

## 2016-06-13 ENCOUNTER — Encounter (HOSPITAL_COMMUNITY): Payer: Self-pay | Admitting: *Deleted

## 2016-06-13 DIAGNOSIS — Z3A27 27 weeks gestation of pregnancy: Secondary | ICD-10-CM | POA: Insufficient documentation

## 2016-06-13 DIAGNOSIS — A5901 Trichomonal vulvovaginitis: Secondary | ICD-10-CM

## 2016-06-13 DIAGNOSIS — O368120 Decreased fetal movements, second trimester, not applicable or unspecified: Secondary | ICD-10-CM | POA: Diagnosis not present

## 2016-06-13 DIAGNOSIS — O99342 Other mental disorders complicating pregnancy, second trimester: Secondary | ICD-10-CM | POA: Diagnosis not present

## 2016-06-13 DIAGNOSIS — O23593 Infection of other part of genital tract in pregnancy, third trimester: Secondary | ICD-10-CM | POA: Diagnosis not present

## 2016-06-13 LAB — URINE MICROSCOPIC-ADD ON: RBC / HPF: NONE SEEN RBC/hpf (ref 0–5)

## 2016-06-13 LAB — URINALYSIS, ROUTINE W REFLEX MICROSCOPIC
Bilirubin Urine: NEGATIVE
GLUCOSE, UA: NEGATIVE mg/dL
KETONES UR: NEGATIVE mg/dL
Nitrite: NEGATIVE
PH: 7 (ref 5.0–8.0)
Protein, ur: NEGATIVE mg/dL
Specific Gravity, Urine: 1.025 (ref 1.005–1.030)

## 2016-06-13 LAB — RAPID URINE DRUG SCREEN, HOSP PERFORMED
Amphetamines: NOT DETECTED
BENZODIAZEPINES: NOT DETECTED
Barbiturates: NOT DETECTED
COCAINE: NOT DETECTED
OPIATES: NOT DETECTED
TETRAHYDROCANNABINOL: NOT DETECTED

## 2016-06-13 NOTE — Discharge Instructions (Signed)
Fetal Movement Counts  Patient Name: __________________________________________________ Patient Due Date: ____________________  Performing a fetal movement count is highly recommended in high-risk pregnancies, but it is good for every pregnant woman to do. Your health care provider may ask you to start counting fetal movements at 28 weeks of the pregnancy. Fetal movements often increase:  · After eating a full meal.  · After physical activity.  · After eating or drinking something sweet or cold.  · At rest.  Pay attention to when you feel the baby is most active. This will help you notice a pattern of your baby's sleep and wake cycles and what factors contribute to an increase in fetal movement. It is important to perform a fetal movement count at the same time each day when your baby is normally most active.   HOW TO COUNT FETAL MOVEMENTS  1. Find a quiet and comfortable area to sit or lie down on your left side. Lying on your left side provides the best blood and oxygen circulation to your baby.  2. Write down the day and time on a sheet of paper or in a journal.  3. Start counting kicks, flutters, swishes, rolls, or jabs in a 2-hour period. You should feel at least 10 movements within 2 hours.  4. If you do not feel 10 movements in 2 hours, wait 2-3 hours and count again. Look for a change in the pattern or not enough counts in 2 hours.  SEEK MEDICAL CARE IF:  · You feel less than 10 counts in 2 hours, tried twice.  · There is no movement in over an hour.  · The pattern is changing or taking longer each day to reach 10 counts in 2 hours.  · You feel the baby is not moving as he or she usually does.  Date: ____________ Movements: ____________ Start time: ____________ Finish time: ____________   Date: ____________ Movements: ____________ Start time: ____________ Finish time: ____________  Date: ____________ Movements: ____________ Start time: ____________ Finish time: ____________  Date: ____________ Movements:  ____________ Start time: ____________ Finish time: ____________  Date: ____________ Movements: ____________ Start time: ____________ Finish time: ____________  Date: ____________ Movements: ____________ Start time: ____________ Finish time: ____________  Date: ____________ Movements: ____________ Start time: ____________ Finish time: ____________  Date: ____________ Movements: ____________ Start time: ____________ Finish time: ____________   Date: ____________ Movements: ____________ Start time: ____________ Finish time: ____________  Date: ____________ Movements: ____________ Start time: ____________ Finish time: ____________  Date: ____________ Movements: ____________ Start time: ____________ Finish time: ____________  Date: ____________ Movements: ____________ Start time: ____________ Finish time: ____________  Date: ____________ Movements: ____________ Start time: ____________ Finish time: ____________  Date: ____________ Movements: ____________ Start time: ____________ Finish time: ____________  Date: ____________ Movements: ____________ Start time: ____________ Finish time: ____________   Date: ____________ Movements: ____________ Start time: ____________ Finish time: ____________  Date: ____________ Movements: ____________ Start time: ____________ Finish time: ____________  Date: ____________ Movements: ____________ Start time: ____________ Finish time: ____________  Date: ____________ Movements: ____________ Start time: ____________ Finish time: ____________  Date: ____________ Movements: ____________ Start time: ____________ Finish time: ____________  Date: ____________ Movements: ____________ Start time: ____________ Finish time: ____________  Date: ____________ Movements: ____________ Start time: ____________ Finish time: ____________   Date: ____________ Movements: ____________ Start time: ____________ Finish time: ____________  Date: ____________ Movements: ____________ Start time: ____________ Finish  time: ____________  Date: ____________ Movements: ____________ Start time: ____________ Finish time: ____________  Date: ____________ Movements: ____________ Start time:   ____________ Finish time: ____________  Date: ____________ Movements: ____________ Start time: ____________ Finish time: ____________  Date: ____________ Movements: ____________ Start time: ____________ Finish time: ____________  Date: ____________ Movements: ____________ Start time: ____________ Finish time: ____________   Date: ____________ Movements: ____________ Start time: ____________ Finish time: ____________  Date: ____________ Movements: ____________ Start time: ____________ Finish time: ____________  Date: ____________ Movements: ____________ Start time: ____________ Finish time: ____________  Date: ____________ Movements: ____________ Start time: ____________ Finish time: ____________  Date: ____________ Movements: ____________ Start time: ____________ Finish time: ____________  Date: ____________ Movements: ____________ Start time: ____________ Finish time: ____________  Date: ____________ Movements: ____________ Start time: ____________ Finish time: ____________   Date: ____________ Movements: ____________ Start time: ____________ Finish time: ____________  Date: ____________ Movements: ____________ Start time: ____________ Finish time: ____________  Date: ____________ Movements: ____________ Start time: ____________ Finish time: ____________  Date: ____________ Movements: ____________ Start time: ____________ Finish time: ____________  Date: ____________ Movements: ____________ Start time: ____________ Finish time: ____________  Date: ____________ Movements: ____________ Start time: ____________ Finish time: ____________  Date: ____________ Movements: ____________ Start time: ____________ Finish time: ____________   Date: ____________ Movements: ____________ Start time: ____________ Finish time: ____________  Date: ____________  Movements: ____________ Start time: ____________ Finish time: ____________  Date: ____________ Movements: ____________ Start time: ____________ Finish time: ____________  Date: ____________ Movements: ____________ Start time: ____________ Finish time: ____________  Date: ____________ Movements: ____________ Start time: ____________ Finish time: ____________  Date: ____________ Movements: ____________ Start time: ____________ Finish time: ____________  Date: ____________ Movements: ____________ Start time: ____________ Finish time: ____________   Date: ____________ Movements: ____________ Start time: ____________ Finish time: ____________  Date: ____________ Movements: ____________ Start time: ____________ Finish time: ____________  Date: ____________ Movements: ____________ Start time: ____________ Finish time: ____________  Date: ____________ Movements: ____________ Start time: ____________ Finish time: ____________  Date: ____________ Movements: ____________ Start time: ____________ Finish time: ____________  Date: ____________ Movements: ____________ Start time: ____________ Finish time: ____________     This information is not intended to replace advice given to you by your health care provider. Make sure you discuss any questions you have with your health care provider.     Document Released: 11/18/2006 Document Revised: 11/09/2014 Document Reviewed: 08/15/2012  Elsevier Interactive Patient Education ©2016 [employer]vier Inc[employer]

## 2016-06-13 NOTE — MAU Note (Signed)
[facility]  Pt states she fell last night around 1930 on her left side.  Pt states the reason she came today was to check on the baby because she wasn't moving.
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
## 2016-06-16 ENCOUNTER — Encounter: Payer: Medicaid Other | Admitting: Medical

## 2016-06-24 ENCOUNTER — Telehealth: Payer: Self-pay

## 2016-06-24 ENCOUNTER — Encounter (HOSPITAL_COMMUNITY): Payer: Self-pay | Admitting: *Deleted

## 2016-06-24 ENCOUNTER — Inpatient Hospital Stay (HOSPITAL_COMMUNITY)
Admission: AD | Admit: 2016-06-24 | Discharge: 2016-06-24 | Disposition: A | Discharge status: Home / Self Care | Payer: Medicaid Other | Source: Ambulatory Visit | Attending: Obstetrics & Gynecology | Admitting: Obstetrics & Gynecology

## 2016-06-24 DIAGNOSIS — R109 Unspecified abdominal pain: Secondary | ICD-10-CM | POA: Insufficient documentation

## 2016-06-24 DIAGNOSIS — A599 Trichomoniasis, unspecified: Secondary | ICD-10-CM | POA: Diagnosis not present

## 2016-06-24 DIAGNOSIS — F319 Bipolar disorder, unspecified: Secondary | ICD-10-CM | POA: Insufficient documentation

## 2016-06-24 DIAGNOSIS — Z331 Pregnant state, incidental: Secondary | ICD-10-CM | POA: Insufficient documentation

## 2016-06-24 DIAGNOSIS — Z7982 Long term (current) use of aspirin: Secondary | ICD-10-CM | POA: Insufficient documentation

## 2016-06-24 LAB — URINALYSIS, ROUTINE W REFLEX MICROSCOPIC
BILIRUBIN URINE: NEGATIVE
Glucose, UA: NEGATIVE mg/dL
HGB URINE DIPSTICK: NEGATIVE
Ketones, ur: 15 mg/dL — AB
NITRITE: NEGATIVE
PROTEIN: NEGATIVE mg/dL
Specific Gravity, Urine: >1.03 — ABNORMAL HIGH (ref 1.005–1.030)
pH: 6 (ref 5.0–8.0)

## 2016-06-24 LAB — URINE MICROSCOPIC-ADD ON

## 2016-06-24 MED ORDER — METRONIDAZOLE 500 MG PO TABS: 500.0000 mg | ORAL_TABLET | Freq: Two times a day (BID) | ORAL | 0 refills | Status: AC

## 2016-06-24 MED ORDER — ACETAMINOPHEN 325 MG PO TABS: 650.0000 mg | ORAL_TABLET | Freq: Four times a day (QID) | ORAL | Status: DC | PRN

## 2016-06-24 NOTE — MAU Provider Note (Signed)
History     CSN: [id number]  Arrival date and time: 06/24/16 1139   First Provider Initiated Contact with Patient 06/24/16 1221      Chief Complaint  Patient presents with  . Abdominal Pain   Deanna Murray 25yo [id number] here with c/o abdominal cramping x 1 week. States has had intermittent abdominal cramping for past 1 week that suddenly became worse today when woke up in the am was pain-free then had sudden b/l lower abdominal cramping as she was getting into standing position. States cramping sensation became worse throughout the day while walking and standing so called clinic and was told to go to MAU. States has not recently had good po hydration since has been busy with classes and being out in the heat for son's football games. Denies vaginal discharge or urinary symptoms. Denies leaking of fluid or bleeding. Endorses good fetal movement. Has mild HA similar to one she's had in the past and has not yet tried tylenol today, states tylenol is usually effective.    OB History    Gravida Para Term Preterm AB Living   4 3 3     3    SAB TAB Ectopic Multiple Live Births           3      Past Medical History:  Diagnosis Date  . Anemia   . Bipolar 1 disorder (HCC)   . Gonorrhea   . postpartum depression 2007  . Preeclampsia 2007  . Pregnancy    7 months  . Seizure [facility](HCC)     Past Surgical History:  Procedure Laterality Date  . NO PAST SURGERIES      Family History  Problem Relation Age of Onset  . Seizures Father   . Alcohol abuse Neg Hx   . Arthritis Neg Hx   . Asthma Neg Hx   . Birth defects Neg Hx   . Cancer Neg Hx   . COPD Neg Hx   . Depression Neg Hx   . Diabetes Neg Hx   . Drug abuse Neg Hx   . Early death Neg Hx   . Hearing loss Neg Hx   . Heart disease Neg Hx   . Hyperlipidemia Neg Hx   . Hypertension Neg Hx   . Kidney disease Neg Hx   . Learning disabilities Neg Hx   . Mental illness Neg Hx   . Mental retardation Neg Hx   . Miscarriages /  Stillbirths Neg Hx   . Stroke Neg Hx   . Vision loss Neg Hx     Social History  Substance Use Topics  . Smoking status: Never Smoker  . Smokeless tobacco: Never Used  . Alcohol use No    Allergies: No Known Allergies  Prescriptions Prior to Admission  Medication Sig Dispense Refill Last Dose  . aspirin EC 81 MG tablet Take 1 tablet (81 mg total) by mouth daily. Take after 12 weeks for prevention of preeclampsia later in pregnancy 300 tablet 2   . Prenatal Vit-Fe Fumarate-FA (PRENATAL VITAMIN) 27-0.8 MG TABS Take 1 tablet by mouth daily. (Patient not taking: Reported on 04/27/2016) 30 tablet 0 Not Taking    Review of Systems  Constitutional: Negative for chills and fever.  Eyes: Negative for blurred vision, double vision and photophobia.  Respiratory: Negative for shortness of breath.   Cardiovascular: Negative for chest pain.  Gastrointestinal: Positive for abdominal pain (b/l lower cramping). Negative for constipation, diarrhea, nausea and vomiting.  Genitourinary: Negative for dysuria,  flank pain, frequency and urgency.  Neurological: Positive for headaches (mild).   Physical Exam   Blood pressure 114/61, pulse 91, temperature 97.8 F (36.6 C), temperature source Oral, resp. rate 16, last menstrual period 01/21/2016, SpO2 100 %, unknown if currently breastfeeding.  Physical Exam  Constitutional: She is oriented to person, place, and time. She appears well-developed and well-nourished. No distress.  HENT:  Head: Normocephalic and atraumatic.  Eyes: EOM are normal. Pupils are equal, round, and reactive to light.  Cardiovascular: Normal rate and regular rhythm.   No murmur heard. Respiratory: Effort normal and breath sounds normal. No respiratory distress. She has no wheezes.  GI: Soft. Bowel sounds are normal. There is tenderness (b/l lower over round ligaments).  Genitourinary:  Genitourinary Comments: Fish odor on cervical check  Musculoskeletal: She exhibits no edema.   Neurological: She is alert and oriented to person, place, and time. She displays normal reflexes.  Skin: Skin is warm and dry.  Psychiatric: She has a normal mood and affect.    MAU Course  Procedures  MDM - UA showed trichomoniasis   Assessment and Plan  IUP@28 .5 Trichomoniasis - metronidazole - discussed STDs and given return precautions - GC/chlamydia urine pending HA - offered patient tylenol in MAU vs at home, patient opted for discharge with tylenol at home - given return precautions.  Followup in clinic on 07/02/16 for scheduled appointment  Leland HerElsia J Yoo PGY-1 06/24/2016, 12:34 PM   CNM attestation:  I have seen and examined this patient; I agree with above documentation in the resident's note.   Marijean Niemannenika R Deanna Murray is a 25 y.o. [phone] reporting abd cramping +FM, denies LOF, VB, contractions, vaginal discharge.  PE: BP 107/65   Pulse 103   Temp 97.8 F (36.6 C) (Oral)   Resp 16   LMP 01/21/2016   SpO2 100%  Gen: calm comfortable, NAD Resp: normal effort, no distress Abd: gravid  ROS, labs, PMH reviewed NST reactive 130s; occ ctx  UA- +trich  Plan: - Flagyl 2gm w/ instructions for partner to be tx (pt denies having intercourse during preg) -  preterm labor precautions - continue routine follow up in OB clinic  SHAW, KIMBERLY, CNM 3:08 AM

## 2016-06-24 NOTE — MAU Note (Signed)
Been cramping in abd and back this past wk.  Became worse today.  Lots of pressure, 'burning in her butt'.  Denies bleeding or leaking.  No hx of PTL or PTD.

## 2016-06-24 NOTE — Discharge Instructions (Signed)
Trichomoniasis Trichomoniasis is an infection caused by an organism called Trichomonas. The infection can affect both women and men. In women, the outer female genitalia and the vagina are affected. In men, the penis is mainly affected, but the prostate and other reproductive organs can also be involved. Trichomoniasis is a sexually transmitted infection (STI) and is most often passed to another person through sexual contact.  RISK FACTORS  Having unprotected sexual intercourse.  Having sexual intercourse with an infected partner. SIGNS AND SYMPTOMS  Symptoms of trichomoniasis in women include:  Abnormal gray-green frothy vaginal discharge.  Itching and irritation of the vagina.  Itching and irritation of the area outside the vagina. Symptoms of trichomoniasis in men include:   Penile discharge with or without pain.  Pain during urination. This results from inflammation of the urethra. DIAGNOSIS  Trichomoniasis may be found during a Pap test or physical exam. Your health care provider may use one of the following methods to help diagnose this infection:  Testing the pH of the vagina with a test tape.  Using a vaginal swab test that checks for the Trichomonas organism. A test is available that provides results within a few minutes.  Examining a urine sample.  Testing vaginal secretions. Your health care provider may test you for other STIs, including HIV. TREATMENT   You may be given medicine to fight the infection. Women should inform their health care provider if they could be or are pregnant. Some medicines used to treat the infection should not be taken during pregnancy.  Your health care provider may recommend over-the-counter medicines or creams to decrease itching or irritation.  Your sexual partner will need to be treated if infected.  Your health care provider may test you for infection again 3 months after treatment. HOME CARE INSTRUCTIONS   Take medicines only as  directed by your health care provider.  Take over-the-counter medicine for itching or irritation as directed by your health care provider.  Do not have sexual intercourse while you have the infection.  Women should not douche or wear tampons while they have the infection.  Discuss your infection with your partner. Your partner may have gotten the infection from you, or you may have gotten it from your partner.  Have your sex partner get examined and treated if necessary.  Practice safe, informed, and protected sex.  See your health care provider for other STI testing. SEEK MEDICAL CARE IF:   You still have symptoms after you finish your medicine.  You develop abdominal pain.  You have pain when you urinate.  You have bleeding after sexual intercourse.  You develop a rash.  Your medicine makes you sick or makes you throw up (vomit). MAKE SURE YOU:  Understand these instructions.  Will watch your condition.  Will get help right away if you are not doing well or get worse.   This information is not intended to replace advice given to you by your health care provider. Make sure you discuss any questions you have with your health care provider.   Document Released: 04/14/2001 Document Revised: 11/09/2014 Document Reviewed: 07/31/2013 Elsevier Interactive Patient Education 2016 Elsevier Inc.  

## 2016-06-24 NOTE — Telephone Encounter (Signed)
Patient called this am stating she was having blurred vision and having pain in her vagina area.  Patient stated she is a history of preeclampsia and is concerns she may be having some of those symptoms. I advised patient to go to the MAU to be check out. I also advised patient to not drive herself to MAU. Patient verbalizes understanding at this time.

## 2016-07-02 ENCOUNTER — Ambulatory Visit (INDEPENDENT_AMBULATORY_CARE_PROVIDER_SITE_OTHER): Payer: Medicaid Other | Admitting: Obstetrics and Gynecology

## 2016-07-02 ENCOUNTER — Ambulatory Visit (INDEPENDENT_AMBULATORY_CARE_PROVIDER_SITE_OTHER): Payer: Self-pay | Admitting: Clinical

## 2016-07-02 ENCOUNTER — Encounter: Payer: Self-pay | Admitting: Obstetrics and Gynecology

## 2016-07-02 VITALS — BP 104/60 | HR 106 | Wt 169.0 lb

## 2016-07-02 DIAGNOSIS — R809 Proteinuria, unspecified: Secondary | ICD-10-CM

## 2016-07-02 DIAGNOSIS — F4321 Adjustment disorder with depressed mood: Secondary | ICD-10-CM

## 2016-07-02 DIAGNOSIS — Z3493 Encounter for supervision of normal pregnancy, unspecified, third trimester: Secondary | ICD-10-CM

## 2016-07-02 LAB — POCT URINALYSIS DIP (DEVICE)
Glucose, UA: NEGATIVE mg/dL
NITRITE: NEGATIVE
Protein, ur: 100 mg/dL — AB
SPECIFIC GRAVITY, URINE: 1.025 (ref 1.005–1.030)
Urobilinogen, UA: 1 mg/dL (ref 0.0–1.0)
pH: 7 (ref 5.0–8.0)

## 2016-07-02 LAB — CBC
HEMATOCRIT: 29.2 % — AB (ref 35.0–45.0)
HEMOGLOBIN: 9.3 g/dL — AB (ref 11.7–15.5)
MCH: 26.9 pg — AB (ref 27.0–33.0)
MCHC: 31.8 g/dL — ABNORMAL LOW (ref 32.0–36.0)
MCV: 84.4 fL (ref 80.0–100.0)
MPV: 11.2 fL (ref 7.5–12.5)
Platelets: 197 10*3/uL (ref 140–400)
RBC: 3.46 MIL/uL — AB (ref 3.80–5.10)
RDW: 13.4 % (ref 11.0–15.0)
WBC: 6.2 10*3/uL (ref 3.8–10.8)

## 2016-07-02 MED ORDER — TETANUS-DIPHTH-ACELL PERTUSSIS 5-2.5-18.5 LF-MCG/0.5 IM SUSP: 0.5000 mL | Freq: Once | INTRAMUSCULAR | Status: DC

## 2016-07-02 NOTE — Progress Notes (Signed)
  ASSESSMENT: Pt currently experiencing Adjustment disorder with depressed mood.Pt needs to f/u with OB. Pt would benefit from psychoeducation and brief therapeutic intervention regarding coping with depressive symptoms  Stage of Change: contemplative  PLAN: 1. F/U with behavioral health clinician as needed 2. Psychiatric Medications: none 3. Behavioral recommendations:   -Continue to advocate for herself with family with 3 daily "me times" -Consider doing relaxation breathing exercise, as practiced in office, to relax at beginning of daily "me times" -Consider compiling list of practical household and other help she will need postpartum, to share with friends and family   SUBJECTIVE: Pt. referred by Dr Genevie AnnSchenk, for "feeling down" Pt. reports the following symptoms/concerns: Pt states that she sometimes feels overwhelmed knowing she will soon be caring for 4 children; copes by taking 3 "me times" per day, to cope with stress. Duration of problem: Increasing past month Severity: mild   OBJECTIVE: Orientation & Cognition: Oriented x3. Thought processes normal and appropriate to situation. Mood: appropriate Affect: appropriate Appearance: appropriate Risk of harm to self or others: no known risk of harm to self or others Substance use: none Assessments administered: PHQ9: 9/ GAD7: 1  Diagnosis: Adjustment disorder with depressed mood CPT Code: F43.21  -------------------------------------------- Other(s) present in the room: young son  Time spent with patient in exam room: 30 minutes, 4-4:30pm  [facility] 07/02/2016 04/27/2016  Decreased Interest 1 0  Down, Depressed, Hopeless 2 0  PHQ - 2 Score 3 0  Altered sleeping 2 2  Tired, decreased energy 2 2  Change in appetite 1 0  Feeling bad or failure about yourself  0 0  Trouble concentrating 0 0  Moving slowly or fidgety/restless 1 0  Suicidal thoughts 0 0  PHQ-9 Score 9 4   GAD 7 : Generalized Anxiety Score  07/02/2016 04/27/2016  Nervous, Anxious, on Edge 0 0  Control/stop worrying 0 1  Worry too much - different things 0 1  Trouble relaxing 1 1  Restless 0 1  Easily annoyed or irritable 0 1  Afraid - awful might happen 0 0  Total GAD 7 Score 1 5

## 2016-07-02 NOTE — Progress Notes (Addendum)
   PRENATAL VISIT NOTE  Subjective:  Deanna Murray is a 25 y.o. G4P3003 at 8750w6d being seen today for ongoing prenatal care.  She is currently monitored for the following issues for this low-risk pregnancy and has Abnormal Pap smear; Iron deficiency anemia; Seizure disorder (HCC); Bipolar disorder (HCC); Supervision of low-risk pregnancy; and Benzodiazepine misuse on her problem list.  Patient reports pt is complaining of feeling slightly down and fatigued. she reprots she feels depressed. She is just overwhelmed about having her fourth child. She reports things just aren't in order right now. .  Contractions: Irritability. Vag. Bleeding: None.  Movement: Present. Denies leaking of fluid.   The following portions of the patient's history were reviewed and updated as appropriate: allergies, current medications, past family history, past medical history, past social history, past surgical history and problem list. Problem list updated.  Objective:   Vitals:   07/02/16 1531  BP: 104/60  Pulse: (!) 106  Weight: 169 lb (76.7 kg)    Fetal Status: Fetal Heart Rate (bpm): 144   Movement: Present     General:  Alert, oriented and cooperative. Patient is in no acute distress.  Skin: Skin is warm and dry. No rash noted.   Cardiovascular: Normal heart rate noted  Respiratory: Normal respiratory effort, no problems with respiration noted  Abdomen: Soft, gravid, appropriate for gestational age. Pain/Pressure: Present     Pelvic:  Cervical exam deferred        Extremities: Normal range of motion.  Edema: None  Mental Status: Normal mood and affect. Normal behavior. Normal judgment and thought content.   Urinalysis:      Assessment and Plan:  Pregnancy: G4P3003 at 1750w6d  1. Supervision of low-risk pregnancy, third trimester - Glucose Tolerance, 1 HR (50g) - CBC - RPR - HIV antibody (with reflex) -PHQ9 minimally elevated. Will refer to jamie for coping mechanisms.  -signed tubal papers  today  Proteinuria: pt with 2+ protein in urine. Some Hgb was well as LE as well. Will send for culture. Work up further if at next visit no improvement or infection.    Preterm labor symptoms and general obstetric precautions including but not limited to vaginal bleeding, contractions, leaking of fluid and fetal movement were reviewed in detail with the patient. Please refer to After Visit Summary for other counseling recommendations.   Follow up in 3 wks.   Lorne SkeensNicholas Michael Arnetra Terris, MD

## 2016-07-02 NOTE — Progress Notes (Signed)
28 wk labs today 28 wk packet given  tdap vaccine and info given

## 2016-07-02 NOTE — Addendum Note (Signed)
Addended by: Gerome ApleyZEYFANG, LINDA L on: 07/02/2016 04:03 PM   Modules accepted: Orders

## 2016-07-02 NOTE — Patient Instructions (Signed)
Postpartum Tubal Ligation Postpartum tubal ligation (PPTL) is a procedure that closes the fallopian tubes right after childbirth or 1-2 days after childbirth. PPTL is done before the uterus returns to its normal location. The procedure is also called a mini-laparotomy. When the fallopian tubes are closed, the eggs that are released from the ovaries cannot enter the uterus, and sperm cannot reach the egg. PPTL is done so you will not be able to get pregnant or have a baby. Although this procedure may be undone (reversed), it should be considered permanent and irreversible. If you want to have future pregnancies, you should not have this procedure. LET YOUR HEALTH CARE PROVIDER KNOW ABOUT:  Any allergies you have.  All medicines you are taking, including vitamins, herbs, eye drops, creams, and over-the-counter medicines. This includes any use of steroids, either by mouth or in cream form.  Previous problems you or members of your family have had with the use of anesthetics.  Any blood disorders you have.  Previous surgeries you have had.  Any medical conditions you may have. RISKS AND COMPLICATIONS  Infection.  Bleeding.  Injury to surrounding organs.  Side effects from anesthetics.  Failure of the procedure.  Ectopic pregnancy.  Future regret about having the procedure done. BEFORE THE PROCEDURE  You may need to sign certain documents, including an informed consent form, up to 30 days before the date of your tubal ligation.  Follow instructions from your health care provider about eating and drinking restrictions. PROCEDURE  If done 1-2 days after a vaginal delivery:  You will be given one or more of the following:  A medicine that helps you relax (sedative).  A medicine that numbs the area (local anesthetic).  A medicine that makes you fall asleep (general anesthetic).  A medicine that is injected into an area of your body that numbs everything below the injection site  (regional anesthetic).  If you have been given general anesthetic, a tube will be put down your throat to help you breathe.  Your bladder may be emptied with a small tube (catheter).  A small cut (incision) will be made just above the pubic hair line.  The fallopian tubes will be located and brought up through the incision.  The fallopian tubes will be tied off or burned (cauterized), or they will be closed with a clamp, ring, or clip. In many cases, a small portion in the center of each fallopian tube will also be removed.  The incision will be closed with stitches (sutures).  A bandage (dressing) will be placed over the incision. The procedure may vary among health care providers and hospitals. If done after a cesarean delivery:  Tubal ligation will be done through the incision that was used for the cesarean delivery of your baby.  After the tubes are closed, the incision will be closed with stitches (sutures).  A bandage (dressing) will be placed over the incision. The procedure may vary among health care providers and hospitals. AFTER THE PROCEDURE  Your blood pressure, heart rate, breathing rate, and blood oxygen level will be monitored often until the medicines you were given have worn off.  You will be given pain medicine as needed.  If you had general anesthetic, you may have some mild discomfort in your throat. This is from the breathing tube that was placed in your throat while you were sleeping.  You may feel tired, and you should rest for the remainder of the day.  You may have some pain   or cramps in the abdominal area for 3-7 days.   This information is not intended to replace advice given to you by your health care provider. Make sure you discuss any questions you have with your health care provider.   Document Released: 10/19/2005 Document Revised: 03/05/2015 Document Reviewed: 01/30/2012 Elsevier Interactive Patient Education 2016 Elsevier Inc.  

## 2016-07-03 LAB — CULTURE, OB URINE

## 2016-07-03 LAB — GLUCOSE TOLERANCE, 1 HOUR (50G) W/O FASTING: Glucose, 1 Hr, gestational: 117 mg/dL (ref <140)

## 2016-07-03 LAB — HIV ANTIBODY (ROUTINE TESTING W REFLEX): HIV 1&2 Ab, 4th Generation: NONREACTIVE

## 2016-07-04 LAB — RPR

## 2016-07-11 ENCOUNTER — Encounter (HOSPITAL_COMMUNITY): Payer: Self-pay | Admitting: Certified Nurse Midwife

## 2016-07-11 ENCOUNTER — Inpatient Hospital Stay (HOSPITAL_COMMUNITY)
Admission: AD | Admit: 2016-07-11 | Discharge: 2016-07-11 | Disposition: A | Discharge status: Home / Self Care | Payer: Medicaid Other | Source: Ambulatory Visit | Attending: Obstetrics and Gynecology | Admitting: Obstetrics and Gynecology

## 2016-07-11 DIAGNOSIS — F319 Bipolar disorder, unspecified: Secondary | ICD-10-CM | POA: Diagnosis not present

## 2016-07-11 DIAGNOSIS — Z8619 Personal history of other infectious and parasitic diseases: Secondary | ICD-10-CM | POA: Insufficient documentation

## 2016-07-11 DIAGNOSIS — O98313 Other infections with a predominantly sexual mode of transmission complicating pregnancy, third trimester: Secondary | ICD-10-CM | POA: Insufficient documentation

## 2016-07-11 DIAGNOSIS — O99343 Other mental disorders complicating pregnancy, third trimester: Secondary | ICD-10-CM | POA: Diagnosis not present

## 2016-07-11 DIAGNOSIS — A5901 Trichomonal vulvovaginitis: Secondary | ICD-10-CM

## 2016-07-11 DIAGNOSIS — O4692 Antepartum hemorrhage, unspecified, second trimester: Secondary | ICD-10-CM

## 2016-07-11 DIAGNOSIS — Z3A31 31 weeks gestation of pregnancy: Secondary | ICD-10-CM | POA: Diagnosis not present

## 2016-07-11 DIAGNOSIS — O23593 Infection of other part of genital tract in pregnancy, third trimester: Secondary | ICD-10-CM

## 2016-07-11 DIAGNOSIS — O4693 Antepartum hemorrhage, unspecified, third trimester: Secondary | ICD-10-CM | POA: Insufficient documentation

## 2016-07-11 LAB — WET PREP, GENITAL
CLUE CELLS WET PREP: NONE SEEN
Sperm: NONE SEEN
YEAST WET PREP: NONE SEEN

## 2016-07-11 LAB — URINALYSIS, ROUTINE W REFLEX MICROSCOPIC
GLUCOSE, UA: NEGATIVE mg/dL
Ketones, ur: 15 mg/dL — AB
Nitrite: NEGATIVE
PH: 7.5 (ref 5.0–8.0)
Protein, ur: NEGATIVE mg/dL
SPECIFIC GRAVITY, URINE: 1.02 (ref 1.005–1.030)

## 2016-07-11 LAB — RAPID URINE DRUG SCREEN, HOSP PERFORMED
Amphetamines: NOT DETECTED
BARBITURATES: NOT DETECTED
Benzodiazepines: NOT DETECTED
Cocaine: NOT DETECTED
Opiates: NOT DETECTED
TETRAHYDROCANNABINOL: NOT DETECTED

## 2016-07-11 LAB — URINE MICROSCOPIC-ADD ON

## 2016-07-11 MED ORDER — METRONIDAZOLE 500 MG PO TABS: 2000.0000 mg | ORAL_TABLET | Freq: Once | ORAL | Status: DC

## 2016-07-11 MED ORDER — METRONIDAZOLE 500 MG PO TABS: 500.0000 mg | ORAL_TABLET | Freq: Two times a day (BID) | ORAL | 0 refills | Status: DC

## 2016-07-11 NOTE — Discharge Instructions (Signed)
Trichomoniasis °Trichomoniasis is an infection caused by an organism called Trichomonas. The infection can affect both women and men. In women, the outer female genitalia and the vagina are affected. In men, the penis is mainly affected, but the prostate and other reproductive organs can also be involved. Trichomoniasis is a sexually transmitted infection (STI) and is most often passed to another person through sexual contact.  °RISK FACTORS °· Having unprotected sexual intercourse. °· Having sexual intercourse with an infected partner. °SIGNS AND SYMPTOMS  °Symptoms of trichomoniasis in women include: °· Abnormal gray-green frothy vaginal discharge. °· Itching and irritation of the vagina. °· Itching and irritation of the area outside the vagina. °Symptoms of trichomoniasis in men include:  °· Penile discharge with or without pain. °· Pain during urination. This results from inflammation of the urethra. °DIAGNOSIS  °Trichomoniasis may be found during a Pap test or physical exam. Your health care provider may use one of the following methods to help diagnose this infection: °· Testing the pH of the vagina with a test tape. °· Using a vaginal swab test that checks for the Trichomonas organism. A test is available that provides results within a few minutes. °· Examining a urine sample. °· Testing vaginal secretions. °Your health care provider may test you for other STIs, including HIV. °TREATMENT  °· You may be given medicine to fight the infection. Women should inform their health care provider if they could be or are pregnant. Some medicines used to treat the infection should not be taken during pregnancy. °· Your health care provider may recommend over-the-counter medicines or creams to decrease itching or irritation. °· Your sexual partner will need to be treated if infected. °· Your health care provider may test you for infection again 3 months after treatment. °HOME CARE INSTRUCTIONS  °· Take medicines only as  directed by your health care provider. °· Take over-the-counter medicine for itching or irritation as directed by your health care provider. °· Do not have sexual intercourse while you have the infection. °· Women should not douche or wear tampons while they have the infection. °· Discuss your infection with your partner. Your partner may have gotten the infection from you, or you may have gotten it from your partner. °· Have your sex partner get examined and treated if necessary. °· Practice safe, informed, and protected sex. °· See your health care provider for other STI testing. °SEEK MEDICAL CARE IF:  °· You still have symptoms after you finish your medicine. °· You develop abdominal pain. °· You have pain when you urinate. °· You have bleeding after sexual intercourse. °· You develop a rash. °· Your medicine makes you sick or makes you throw up (vomit). °MAKE SURE YOU: °· Understand these instructions. °· Will watch your condition. °· Will get help right away if you are not doing well or get worse. °  °This information is not intended to replace advice given to you by your health care provider. Make sure you discuss any questions you have with your health care provider. °  °Document Released: 04/14/2001 Document Revised: 11/09/2014 Document Reviewed: 07/31/2013 °Elsevier Interactive Patient Education ©2016 Elsevier Inc. °Vaginal Bleeding During Pregnancy, Second Trimester °A small amount of bleeding (spotting) from the vagina is relatively common in pregnancy. It usually stops on its own. Various things can cause bleeding or spotting in pregnancy. Some bleeding may be related to the pregnancy, and some may not. Sometimes the bleeding is normal and is not a problem. However, bleeding can   also be a sign of something serious. Be sure to tell your health care provider about any vaginal bleeding right away. °Some possible causes of vaginal bleeding during the second trimester include: °· Infection, inflammation, or  growths on the cervix.   °· The placenta may be partially or completely covering the opening of the cervix inside the uterus (placenta previa). °· The placenta may have separated from the uterus (abruption of the placenta).   °· You may be having early (preterm) labor.   °· The cervix may not be strong enough to keep a baby inside the uterus (cervical insufficiency).   °· Tiny cysts may have developed in the uterus instead of pregnancy tissue (molar pregnancy).  °HOME CARE INSTRUCTIONS  °Watch your condition for any changes. The following actions may help to lessen any discomfort you are feeling: °· Follow your health care provider's instructions for limiting your activity. If your health care provider orders bed rest, you may need to stay in bed and only get up to use the bathroom. However, your health care provider may allow you to continue light activity. °· If needed, make plans for someone to help with your regular activities and responsibilities while you are on bed rest. °· Keep track of the number of pads you use each day, how often you change pads, and how soaked (saturated) they are. Write this down. °· Do not use tampons. Do not douche. °· Do not have sexual intercourse or orgasms until approved by your health care provider. °· If you pass any tissue from your vagina, save the tissue so you can show it to your health care provider. °· Only take over-the-counter or prescription medicines as directed by your health care provider. °· Do not take aspirin because it can make you bleed. °· Do not exercise or perform any strenuous activities or heavy lifting without your health care provider's permission. °· Keep all follow-up appointments as directed by your health care provider. °SEEK MEDICAL CARE IF: °· You have any vaginal bleeding during any part of your pregnancy. °· You have cramps or labor pains. °· You have a fever, not controlled by medicine. °SEEK IMMEDIATE MEDICAL CARE IF:  °· You have severe cramps  in your back or belly (abdomen). °· You have contractions. °· You have chills. °· You pass large clots or tissue from your vagina. °· Your bleeding increases. °· You feel light-headed or weak, or you have fainting episodes. °· You are leaking fluid or have a gush of fluid from your vagina. °MAKE SURE YOU: °· Understand these instructions. °· Will watch your condition. °· Will get help right away if you are not doing well or get worse. °  °This information is not intended to replace advice given to you by your health care provider. Make sure you discuss any questions you have with your health care provider. °  °Document Released: 07/29/2005 Document Revised: 10/24/2013 Document Reviewed: 06/26/2013 °Elsevier Interactive Patient Education ©2016 Elsevier Inc. ° °

## 2016-07-11 NOTE — MAU Provider Note (Signed)
Chief Complaint:  Vaginal Bleeding and Contractions   None     HPI: Deanna Murray is a 25 y.o. [phone] at [redacted]w[redacted]d who presents to maternity admissions via EMS reporting bright red vaginal bleeding at home today and abdominal pain since yesterday. She reports she started having constant abdominal pain yesterday but was able to sleep and did not try any treatments. Then today, she reported bleeding and does have a photo of a pinkish/red blood spot on paper towels/pad at home   She reports her abdominal pain is worse, and is now intermittent, lasting 30-45 minutes each time then going away, and she has intermittent sharp pain in her vagina.  She has not tried treatments for pain today and it is worsening since onset.. She reports good fetal movement, denies LOF, vaginal bleeding, vaginal itching/burning, urinary symptoms, h/a, dizziness, n/v, or fever/chills.    HPI  Past Medical History: Past Medical History:  Diagnosis Date  . Anemia   . Bipolar 1 disorder (HCC)   . Gonorrhea   . postpartum depression 2007  . Preeclampsia 2007  . Pregnancy    7 months  . Seizure (HCC)     Past obstetric history: OB History  Gravida Para Term Preterm AB Living  4 3 3     3   SAB TAB Ectopic Multiple Live Births          3    # Outcome Date GA Lbr Len/2nd Weight Sex Delivery Anes PTL Lv  4 Current           3 Term 02/01/14 [redacted]w[redacted]d 07:02 / 01:26 8 lb 7.5 oz (3.84 kg) M Vag-Spont EPI  LIV  2 Term 08/28/08 [redacted]w[redacted]d  7 lb 10 oz (3.459 kg) F Vag-Spont EPI  LIV  1 Term 06/29/06 [redacted]w[redacted]d  7 lb 15 oz (3.6 kg) M Vag-Spont None  LIV      Past Surgical History: Past Surgical History:  Procedure Laterality Date  . NO PAST SURGERIES      Family History: Family History  Problem Relation Age of Onset  . Seizures Father   . Alcohol abuse Neg Hx   . Arthritis Neg Hx   . Asthma Neg Hx   . Birth defects Neg Hx   . Cancer Neg Hx   . COPD Neg Hx   . Depression Neg Hx   . Diabetes Neg Hx   . Drug abuse Neg Hx    . Early death Neg Hx   . Hearing loss Neg Hx   . Heart disease Neg Hx   . Hyperlipidemia Neg Hx   . Hypertension Neg Hx   . Kidney disease Neg Hx   . Learning disabilities Neg Hx   . Mental illness Neg Hx   . Mental retardation Neg Hx   . Miscarriages / Stillbirths Neg Hx   . Stroke Neg Hx   . Vision loss Neg Hx     Social History: Social History  Substance Use Topics  . Smoking status: Never Smoker  . Smokeless tobacco: Never Used  . Alcohol use No    Allergies: No Known Allergies  Meds:  No prescriptions prior to admission.    ROS:  Review of Systems  Constitutional: Negative for chills, fatigue and fever.  Eyes: Negative for visual disturbance.  Respiratory: Negative for shortness of breath.   Cardiovascular: Negative for chest pain.  Gastrointestinal: Positive for abdominal pain. Negative for nausea and vomiting.  Genitourinary: Positive for pelvic pain and vaginal bleeding. Negative  for difficulty urinating, dysuria, flank pain, vaginal discharge and vaginal pain.  Musculoskeletal: Positive for back pain.  Neurological: Negative for dizziness and headaches.  Psychiatric/Behavioral: Negative.      I have reviewed patient's Past Medical Hx, Surgical Hx, Family Hx, Social Hx, medications and allergies.   Physical Exam   Patient Vitals for the past 24 hrs:  BP Temp Temp src Pulse Resp  07/11/16 1224 114/60 97.8 F (36.6 C) Oral 116 18   Constitutional: Well-developed, well-nourished female in no acute distress.  Cardiovascular: normal rate Respiratory: normal effort GI: Abd soft, non-tender, gravid appropriate for gestational age.  MS: Extremities nontender, no edema, normal ROM Neurologic: Alert and oriented x 4.  GU: Neg CVAT.  PELVIC EXAM: Cervix pink, visually closed, friable to cotton swab, large amount frothy yellow malodorous discharge, vaginal walls and external genitalia normal   Dilation: Closed Effacement (%): Thick Cervical Position:  Posterior Exam by:: L. Leftwich-Kirby CNM  FHT:  Baseline 135, moderate variability, accelerations present, no decelerations Contractions: occasional, mild to palpation   Labs: Results for orders placed or performed during the hospital encounter of 07/11/16 (from the past 24 hour(s))  Urinalysis, Routine w reflex microscopic (not at [facility])     Status: Abnormal   Collection Time: 07/11/16 12:40 PM  Result Value Ref Range   Color, Urine YELLOW YELLOW   APPearance CLEAR CLEAR   Specific Gravity, Urine 1.020 1.005 - 1.030   pH 7.5 5.0 - 8.0   Glucose, UA NEGATIVE NEGATIVE mg/dL   Hgb urine dipstick SMALL (A) NEGATIVE   Bilirubin Urine SMALL (A) NEGATIVE   Ketones, ur 15 (A) NEGATIVE mg/dL   Protein, ur NEGATIVE NEGATIVE mg/dL   Nitrite NEGATIVE NEGATIVE   Leukocytes, UA MODERATE (A) NEGATIVE  Urine rapid drug screen (hosp performed)     Status: None   Collection Time: 07/11/16 12:40 PM  Result Value Ref Range   Opiates NONE DETECTED NONE DETECTED   Cocaine NONE DETECTED NONE DETECTED   Benzodiazepines NONE DETECTED NONE DETECTED   Amphetamines NONE DETECTED NONE DETECTED   Tetrahydrocannabinol NONE DETECTED NONE DETECTED   Barbiturates NONE DETECTED NONE DETECTED  Wet prep, genital     Status: Abnormal   Collection Time: 07/11/16 12:40 PM  Result Value Ref Range   Yeast Wet Prep HPF POC NONE SEEN NONE SEEN   Trich, Wet Prep PRESENT (A) NONE SEEN   Clue Cells Wet Prep HPF POC NONE SEEN NONE SEEN   WBC, Wet Prep HPF POC MANY (A) NONE SEEN   Sperm NONE SEEN   Urine microscopic-add on     Status: Abnormal   Collection Time: 07/11/16 12:40 PM  Result Value Ref Range   Squamous Epithelial / LPF 0-5 (A) NONE SEEN   WBC, UA 0-5 0 - 5 WBC/hpf   RBC / HPF 0-5 0 - 5 RBC/hpf   Bacteria, UA MANY (A) NONE SEEN   Urine-Other TRICHOMONAS PRESENT    O/POS/-- (06/26 1401)  Imaging:  No results found.  MAU Course/MDM: I have ordered labs and reviewed results.  No evidence of preterm  labor. No vaginal bleeding noted except for friability of cervix with trichomonas present.  Pt treated 2 weeks ago with Flagyl 2 g dose and denies intercourse since treatment.  Will treat with 7 day therapy.  Pt to f/u in office.  Reviewed bleeding precautions/warning signs of pregnancy.  Pt stable at time of discharge.  Assessment: 1. Trichomonal vaginitis during pregnancy in third trimester   2.  Vaginal bleeding in pregnancy, second trimester     Plan: Discharge home Labor precautions and fetal kick counts Follow-up Information    [facility]   Specialty:  Obstetrics and Gynecology Why:  As scheduled, return to MAU as needed for emergencies Contact information: [address] [place] [id number] [phone]           Medication List    TAKE these medications   aspirin EC 81 MG tablet Take 1 tablet (81 mg total) by mouth daily. Take after 12 weeks for prevention of preeclampsia later in pregnancy   metroNIDAZOLE 500 MG tablet Commonly known as:  FLAGYL Take 1 tablet (500 mg total) by mouth 2 (two) times daily.   Prenatal Vitamin 27-0.8 MG Tabs Take 1 tablet by mouth daily.       Sharen CounterLisa Leftwich-Kirby Certified Nurse-Midwife 07/11/2016 6:14 PM

## 2016-07-11 NOTE — MAU Note (Signed)
Pt states she had some bright red spotting yesterday and today she was in the shower and it was running down her legs. Pt has left sided tenderness and ctxing every 5 minutes. Pt states she +FM and denies leaking of fluid.

## 2016-07-12 ENCOUNTER — Encounter: Payer: Self-pay | Admitting: Obstetrics and Gynecology

## 2016-07-12 DIAGNOSIS — A599 Trichomoniasis, unspecified: Secondary | ICD-10-CM | POA: Insufficient documentation

## 2016-07-13 LAB — GC/CHLAMYDIA PROBE AMP (~~LOC~~) NOT AT ARMC
CHLAMYDIA, DNA PROBE: NEGATIVE
NEISSERIA GONORRHEA: NEGATIVE

## 2016-07-29 ENCOUNTER — Encounter: Payer: Medicaid Other | Admitting: Obstetrics and Gynecology

## 2016-08-12 ENCOUNTER — Inpatient Hospital Stay (HOSPITAL_COMMUNITY)
Admission: EM | Admit: 2016-08-12 | Discharge: 2016-08-12 | Disposition: A | Discharge status: Home / Self Care | Payer: Medicaid Other | Attending: Cardiology | Admitting: Cardiology

## 2016-08-12 ENCOUNTER — Encounter (HOSPITAL_COMMUNITY): Payer: Self-pay | Admitting: *Deleted

## 2016-08-12 DIAGNOSIS — S3991XA Unspecified injury of abdomen, initial encounter: Secondary | ICD-10-CM | POA: Diagnosis not present

## 2016-08-12 DIAGNOSIS — O283 Abnormal ultrasonic finding on antenatal screening of mother: Secondary | ICD-10-CM

## 2016-08-12 DIAGNOSIS — W1839XA Other fall on same level, initial encounter: Secondary | ICD-10-CM | POA: Insufficient documentation

## 2016-08-12 DIAGNOSIS — O26893 Other specified pregnancy related conditions, third trimester: Secondary | ICD-10-CM | POA: Diagnosis not present

## 2016-08-12 DIAGNOSIS — O4700 False labor before 37 completed weeks of gestation, unspecified trimester: Secondary | ICD-10-CM

## 2016-08-12 DIAGNOSIS — O479 False labor, unspecified: Secondary | ICD-10-CM

## 2016-08-12 DIAGNOSIS — Z7982 Long term (current) use of aspirin: Secondary | ICD-10-CM | POA: Insufficient documentation

## 2016-08-12 DIAGNOSIS — Z3A35 35 weeks gestation of pregnancy: Secondary | ICD-10-CM | POA: Insufficient documentation

## 2016-08-12 LAB — CBC WITH DIFFERENTIAL/PLATELET
Basophils Absolute: 0 10*3/uL (ref 0.0–0.1)
Basophils Relative: 0 %
EOS PCT: 2 %
Eosinophils Absolute: 0.1 10*3/uL (ref 0.0–0.7)
HCT: 28.3 % — ABNORMAL LOW (ref 36.0–46.0)
Hemoglobin: 9 g/dL — ABNORMAL LOW (ref 12.0–15.0)
LYMPHS ABS: 2.7 10*3/uL (ref 0.7–4.0)
LYMPHS PCT: 32 %
MCH: 25.7 pg — AB (ref 26.0–34.0)
MCHC: 31.8 g/dL (ref 30.0–36.0)
MCV: 80.9 fL (ref 78.0–100.0)
MONO ABS: 0.7 10*3/uL (ref 0.1–1.0)
Monocytes Relative: 9 %
Neutro Abs: 4.7 10*3/uL (ref 1.7–7.7)
Neutrophils Relative %: 57 %
PLATELETS: 217 10*3/uL (ref 150–400)
RBC: 3.5 MIL/uL — ABNORMAL LOW (ref 3.87–5.11)
RDW: 14 % (ref 11.5–15.5)
WBC: 8.3 10*3/uL (ref 4.0–10.5)

## 2016-08-12 LAB — BASIC METABOLIC PANEL
Anion gap: 7 (ref 5–15)
CALCIUM: 8.8 mg/dL — AB (ref 8.9–10.3)
CO2: 21 mmol/L — ABNORMAL LOW (ref 22–32)
Chloride: 109 mmol/L (ref 101–111)
Creatinine, Ser: 0.52 mg/dL (ref 0.44–1.00)
GFR calc Af Amer: >60 mL/min (ref >60)
GLUCOSE: 73 mg/dL (ref 65–99)
Potassium: 3.4 mmol/L — ABNORMAL LOW (ref 3.5–5.1)
Sodium: 137 mmol/L (ref 135–145)

## 2016-08-12 MED ORDER — SODIUM CHLORIDE 0.9 % IV BOLUS (SEPSIS)
1000.0000 mL | Freq: Once | INTRAVENOUS | Status: AC
Start: 2016-08-12 — End: 2016-08-12
  Administered 2016-08-12: 1000 mL via INTRAVENOUS

## 2016-08-12 MED ORDER — ACETAMINOPHEN 500 MG PO TABS
500.0000 mg | ORAL_TABLET | Freq: Once | ORAL | Status: AC
  Administered 2016-08-12: 500 mg via ORAL
  Filled 2016-08-12: qty 1

## 2016-08-12 NOTE — ED Provider Notes (Signed)
[facility]Y DEPT Provider Note   CSN: [id number] Arrival date & time: 08/12/16  1737     History   Chief Complaint Chief Complaint  Patient presents with  . Fall  . [redacted] wks pregnant    HPI Deanna Murray is a 25 y.o. female.  The history is provided by the patient.  Fall  This is a new problem. The current episode started less than 1 hour ago (about 30 min PTA). The problem occurs daily (once). The problem has been resolved. Associated symptoms include abdominal pain. Pertinent negatives include no chest pain, no headaches and no shortness of breath. Associated symptoms comments: Contractions since fall. Nothing aggravates the symptoms. Nothing relieves the symptoms. She has tried nothing for the symptoms.    Past Medical History:  Diagnosis Date  . Anemia   . Bipolar 1 disorder (HCC)   . Gonorrhea   . postpartum depression 2007  . Preeclampsia 2007  . Pregnancy    7 months  . Seizure [facility])     Patient Active Problem List   Diagnosis Date Noted  . Abnormal fetal ultrasound 08/12/2016  . Trichomoniasis 07/12/2016  . 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 Seizure disorder (HCC) 04/27/2016  . Bipolar disorder (HCC) 04/27/2016  . Supervision of low-risk pregnancy 04/27/2016  . Iron deficiency anemia 12/27/2013  . Abnormal Pap smear 10/12/2013    Past Surgical History:  Procedure Laterality Date  . NO PAST SURGERIES      OB History    Gravida Para Term Preterm AB Living   4 3 3     3    SAB TAB Ectopic Multiple Live Births           3       Home Medications    Prior to Admission medications   Medication Sig Start Date End Date Taking? Authorizing Provider  aspirin EC 81 MG tablet Take 1 tablet (81 mg total) by mouth daily. Take after 12 weeks for prevention of preeclampsia later in pregnancy 04/27/16  Yes Deanna Running, MD  Prenatal Vit-Fe Fumarate-FA (PRENATAL VITAMIN) 27-0.8 MG TABS Take 1 tablet by mouth daily. 04/24/16  Yes Deanna Orlene Och, NP     Family History Family History  Problem Relation Age of Onset  . Seizures Father   . Alcohol abuse Neg Hx   . Arthritis Neg Hx   . Asthma Neg Hx   . Birth defects Neg Hx   . Cancer Neg Hx   . COPD Neg Hx   . Depression Neg Hx   . Diabetes Neg Hx   . Drug abuse Neg Hx   . Early death Neg Hx   . Hearing loss Neg Hx   . Heart disease Neg Hx   . Hyperlipidemia Neg Hx   . Hypertension Neg Hx   . Kidney disease Neg Hx   . Learning disabilities Neg Hx   . Mental illness Neg Hx   . Mental retardation Neg Hx   . Miscarriages / Stillbirths Neg Hx   . Stroke Neg Hx   . Vision loss Neg Hx     Social History Social History  Substance Use Topics  . Smoking status: Never Smoker  . Smokeless tobacco: Never Used  . Alcohol use No     Allergies   Review of patient's allergies indicates no known allergies.   Review of Systems Review of Systems  Constitutional: Negative for fatigue.  HENT: Negative for congestion.   Respiratory: Negative for shortness of breath.  Cardiovascular: Negative for chest pain.  Gastrointestinal: Positive for abdominal pain.  Genitourinary: Negative for flank pain.  Musculoskeletal: Negative for back pain and neck pain.       Right hip pain x couple weeks, causing intermittent sharp shooting pains down RLE and intermittent parasthesias  Skin: Negative for rash.  Neurological: Negative for headaches.  Psychiatric/Behavioral: Negative for confusion.     Physical Exam Updated Vital Signs BP 116/70 (BP Location: Right Arm)   Pulse 89   Temp 98.2 F (36.8 C) (Oral)   Resp 20   Ht 5\' 4"  (1.626 m)   Wt 80.7 kg   LMP 01/21/2016   SpO2 100%   BMI 30.55 kg/m   Physical Exam  Constitutional: She is oriented to person, place, and time. She appears well-developed and well-nourished. No distress.  Pleasant, cooperative, well-appearing  HENT:  Head: Normocephalic and atraumatic.  Eyes: Conjunctivae are normal. No scleral icterus.  Neck: Normal  range of motion. Neck supple.  Cardiovascular: Normal rate, regular rhythm and intact distal pulses.   Pulmonary/Chest: Effort normal and breath sounds normal.  Abdominal: Soft. There is no tenderness.  Gravid uterus, c/w [redacted] week pregnant  Musculoskeletal: She exhibits no edema, tenderness or deformity.  Intact strength and sensation in b/l Le's.   Neurological: She is alert and oriented to person, place, and time. She exhibits normal muscle tone. Coordination normal.  Skin: Skin is warm and dry. Capillary refill takes less than 2 seconds. No rash noted. She is not diaphoretic.  Psychiatric: She has a normal mood and affect.  Nursing note and vitals reviewed.    ED Treatments / Results  Labs (all labs ordered are listed, but only abnormal results are displayed) Labs Reviewed  CBC WITH DIFFERENTIAL/PLATELET - Abnormal; Notable for the following:       Result Value   RBC 3.50 (*)    Hemoglobin 9.0 (*)    HCT 28.3 (*)    MCH 25.7 (*)    All other components within normal limits  BASIC METABOLIC PANEL - Abnormal; Notable for the following:    Potassium 3.4 (*)    CO2 21 (*)    BUN <5 (*)    Calcium 8.8 (*)    All other components within normal limits    EKG  EKG Interpretation None       Radiology No results found.  Procedures Procedures (including critical care time)  Medications Ordered in ED Medications  acetaminophen (TYLENOL) tablet 500 mg (500 mg Oral Given 08/12/16 1824)  sodium chloride 0.9 % bolus 1,000 mL (0 mLs Intravenous Stopped 08/12/16 1931)     Initial Impression / Assessment and Plan / ED Course  I have reviewed the triage vital signs and the nursing notes.  Pertinent labs & imaging results that were available during my care of the patient were reviewed by me and considered in my medical decision making (see chart for details).  Clinical Course   Deanna Murray is a 25 y.o. female [phone] at 35 weeks, pregnancy complicated by STI's, who  presents to ED for contractions every few minutes since GLF off of couch as trying to get up when her RLE "gave out", likely 2/2 ongoing right SI joint pain with associated sciatica. Neuro intact. States caught herself with her RUE before her right sided abdomen hit the floor. Only pain is the intermittent ctx and her ongoing RLE pain, able to range it well with SI joint TTP on exam. No other injuries. No bleeding,  no LOF per vagina. In 2015, O+ by review of records. Contractions every few minutes confirmed on toco, transferred for further management of active labor.   Pt condition, course, and transfer were discussed with attending physician Dr. Eber HongBrian Miller.   Final Clinical Impressions(s) / ED Diagnoses   Final diagnoses:  Abdominal trauma, initial encounter  Premature uterine contractions    New Prescriptions Discharge Medication List as of 08/12/2016  9:40 PM       Horald PollenAudrey Olyvia Gopal, MD 08/13/16 [id number]    Eber HongBrian Miller, MD 08/15/16 0100

## 2016-08-12 NOTE — Progress Notes (Signed)
[facility] for G4P3 35.5 weeks who fell on her right side, pt came in via EMS and started to feel contractions in EMS, placed on fetal monitor, pt contracting every 4 minutes. Denies bleeding or leaking any fluid. Called dr Emelda Fearferguson, informed of FHR tracing, pt status, to transfer to [facility] for further evaluation, IV fluids started. [place] Dr made aware of transfer.

## 2016-08-12 NOTE — Discharge Instructions (Signed)
Blunt Abdominal Trauma °Blunt abdominal trauma is a type of injury that involves damage to the abdominal wall or to abdominal organs, such as the liver or spleen. The damage can involve bruising, tearing, or a rupture. This type of injury does not involve a puncture of the skin. °Blunt abdominal trauma can range from mild to severe. In some cases it can lead to a severe abdominal inflammation (peritonitis), severe bleeding, and a dangerous drop in blood pressure. °CAUSES °This injury is caused by a hard, direct hit to the abdomen. It can happen after: °· A motor vehicle accident. °· Being kicked or punched in the abdomen. °· Falling from a significant height. °RISK FACTORS °This injury is more likely to happen in people who: °· Play contact sports. °· Work in a job in which falls or injuries are more likely, such as in construction. °SYMPTOMS °The main symptom of this condition is pain in the abdomen. Other symptoms depend on the type and location of the injury. They can include: °· Abdominal pain that spreads to the the back or shoulder. °· Bruising. °· Swelling. °· Pain when pressing on the abdomen. °· Blood in the urine. °· Weakness. °· Confusion. °· Loss of consciousness. °· Pale, dusky, cool, or sweaty skin. °· Vomiting blood. °· Bloody stool or bleeding from the rectum. °· Trouble breathing. °Symptoms of this injury can develop suddenly or slowly.  °DIAGNOSIS °This injury is diagnosed based on your symptoms and a physical exam. You may also have tests, including: °· Blood tests. °· Urine tests. °· Imaging tests, such as: °¨ A CT scan and ultrasound of your abdomen. °¨ X-rays of your chest and abdomen. °· A test in which a tube is used to flush your abdomen with fluid and check for blood (diagnostic peritoneal lavage). °TREATMENT °Treatment for this injury depends on its type and severity. Treatment options include: °· Observation. If the injury is mild, this may be the only treatment needed. °· Support of your  blood pressure and breathing. °· Getting blood, fluids, or medicine through an IV tube. °· Antibiotic medicine. °· Insertion of tubes into the stomach or bladder. °· A blood transfusion. °· A procedure to stop bleeding. This involves putting a long, thin tube (catheter) into one of your blood vessels (angiographic embolization). °· Surgery to open up your abdomen and control bleeding or repair damage (laparotomy). This may be done if tests suggest that you have peritonitis or bleeding that cannot be controlled with angiographic embolization. °HOME CARE INSTRUCTIONS °· Take medicines only as directed by your health care provider. °· If you were prescribed an antibiotic medicine, finish all of it even if you start to feel better. °· Follow your health care provider's instructions about diet and activity restrictions. °· Keep all follow-up visits as directed by your health care provider. This is important. °SEEK MEDICAL CARE IF: °· You continue to have abdominal pain. °· Your symptoms return. °· You develop new symptoms. °· You have blood in your urine or your bowel movements. °SEEK IMMEDIATE MEDICAL CARE IF: °· You vomit blood. °· You have heavy bleeding from your rectum. °· You have very bad abdominal pain. °· You have trouble breathing. °· You have chest pain. °· You have a fever. °· You have dizziness. °· You pass out. °  °This information is not intended to replace advice given to you by your health care provider. Make sure you discuss any questions you have with your health care provider. °  °Document Released:   11/26/2004 Document Revised: 03/05/2015 Document Reviewed: 10/10/2014 °Elsevier Interactive Patient Education ©2016 [employer] ° °

## 2016-08-12 NOTE — ED Notes (Signed)
Pt having contractions every 3-5 minutes. [facility] RN at bedside.

## 2016-08-12 NOTE — MAU Provider Note (Signed)
Chief Complaint:  Fall and [redacted] wks pregnant   First Provider Initiated Contact with Patient 08/12/16 2023     HPI: Deanna Murray is a 25 y.o. G4P3003 at 70w5dwho presents to maternity admissions reporting fall on to her right side. Tells me that she did impact her abdomen, though she told ED MD she did not.  States has a lot of pain on the right side of her abdomen. No pain on left.. She reports good fetal movement, denies LOF, vaginal bleeding, vaginal itching/burning, urinary symptoms, h/a, dizziness, n/v, diarrhea, constipation or fever/chills.  She denies headache, visual changes or RUQ abdominal pain.  Fall  The accident occurred 3 to 5 days ago. She fell from a height of 3 to 5 ft. There was no blood loss. Point of impact: right abdomen. Pain location: right abdomen. The pain is moderate. The symptoms are aggravated by pressure on injury. Associated symptoms include abdominal pain. Pertinent negatives include no fever, headaches, hematuria, loss of consciousness, nausea, numbness or tingling. She has tried nothing for the symptoms.   ED Note: The patient is a 25 year old female, she has a history of being pregnant, [redacted] weeks along, otherwise, located, states that tonight she accidentally fell onto her right side, this was somewhat graceful but did impact her right abdomen. She has minimal tenderness in the right lower quadrant, she has normal heart tones, normal fetal heart tones, there is contractions on the tocometry monitoring. [facility]da Fear, I completed the paperwork, the patient is in agreement with the plan.  Past Medical History: Past Medical History:  Diagnosis Date  . Anemia   . Bipolar 1 disorder (HCC)   . Gonorrhea   . postpartum depression 2007  . Preeclampsia 2007  . Pregnancy    7 months  . Seizure (HCC)     Past obstetric history: OB History  Gravida Para Term Preterm AB Living  4 3 3     3   SAB TAB Ectopic Multiple Live  Births          3    # Outcome Date GA Lbr Len/2nd Weight Sex Delivery Anes PTL Lv  4 Current           3 Term 02/01/14 [redacted]w[redacted]d 07:02 / 01:26 3.84 kg (8 lb 7.5 oz) M Vag-Spont EPI  LIV  2 Term 08/28/08 [redacted]w[redacted]d  3.459 kg (7 lb 10 oz) F Vag-Spont EPI  LIV  1 Term 06/29/06 [redacted]w[redacted]d  3.6 kg (7 lb 15 oz) M Vag-Spont None  LIV      Past Surgical History: Past Surgical History:  Procedure Laterality Date  . NO PAST SURGERIES      Family History: Family History  Problem Relation Age of Onset  . Seizures Father   . Alcohol abuse Neg Hx   . Arthritis Neg Hx   . Asthma Neg Hx   . Birth defects Neg Hx   . Cancer Neg Hx   . COPD Neg Hx   . Depression Neg Hx   . Diabetes Neg Hx   . Drug abuse Neg Hx   . Early death Neg Hx   . Hearing loss Neg Hx   . Heart disease Neg Hx   . Hyperlipidemia Neg Hx   . Hypertension Neg Hx   . Kidney disease Neg Hx   . Learning disabilities Neg Hx   . Mental illness Neg Hx   . Mental retardation Neg Hx   . Miscarriages /  Stillbirths Neg Hx   . Stroke Neg Hx   . Vision loss Neg Hx     Social History: Social History  Substance Use Topics  . Smoking status: Never Smoker  . Smokeless tobacco: Never Used  . Alcohol use No    Allergies: No Known Allergies  Meds:  Facility-Administered Medications Prior to Admission  Medication Dose Route Frequency Provider Last Rate Last Dose  . Tdap (BOOSTRIX) injection 0.5 mL  0.5 mL Intramuscular Once Lorne Skeens, MD       Prescriptions Prior to Admission  Medication Sig Dispense Refill Last Dose  . aspirin EC 81 MG tablet Take 1 tablet (81 mg total) by mouth daily. Take after 12 weeks for prevention of preeclampsia later in pregnancy 300 tablet 2 08/11/2016 at Unknown time  . Prenatal Vit-Fe Fumarate-FA (PRENATAL VITAMIN) 27-0.8 MG TABS Take 1 tablet by mouth daily. 30 tablet 0 08/12/2016 at Unknown time  . metroNIDAZOLE (FLAGYL) 500 MG tablet Take 1 tablet (500 mg total) by mouth 2 (two) times daily.  (Patient not taking: Reported on 08/12/2016) 14 tablet 0 Not Taking at Unknown time    I have reviewed patient's Past Medical Hx, Surgical Hx, Family Hx, Social Hx, medications and allergies.   ROS:  Review of Systems  Constitutional: Negative for fever.  Gastrointestinal: Positive for abdominal pain. Negative for nausea.  Genitourinary: Negative for hematuria.  Neurological: Negative for tingling, loss of consciousness, numbness and headaches.   Other systems negative  Physical Exam  Patient Vitals for the past 24 hrs:  BP Temp Temp src Pulse Resp SpO2 Height Weight  08/12/16 1956 118/67 99 F (37.2 C) - 91 20 100 % - -  08/12/16 1930 114/81 - - 92 - 100 % - -  08/12/16 1915 125/79 - - 91 - 100 % - -  08/12/16 1900 119/88 - - 106 - 100 % - -  08/12/16 1845 113/86 - - 100 - 100 % - -  08/12/16 1830 125/84 - - 91 - 100 % - -  08/12/16 1815 115/86 - - 103 - 100 % - -  08/12/16 1800 117/87 - - 106 - 100 % - -  08/12/16 1746 - - - - - - 5\' 4"  (1.626 m) 80.7 kg (178 lb)  08/12/16 1745 119/83 98.2 F (36.8 C) Oral 99 18 100 % - -   Constitutional: Well-developed, well-nourished female in no acute distress.  Cardiovascular: normal rate and rhythm Respiratory: normal effort, clear to auscultation bilaterally GI: Abd soft, non-tender, gravid appropriate for gestational age.   No rebound or guarding. MS: Extremities nontender, no edema, normal ROM Neurologic: Alert and oriented x 4.  GU: Neg CVAT.  Dilation: Closed Effacement (%): Thick Cervical Position: Posterior Station: Ballotable Presentation: Vertex Exam by::  (bedside ultrasound done by dr Hyacinth Meeker)  FHT:  Baseline 140 , moderate variability, accelerations present, no decelerations Contractions: q 6 mins Irregular     Labs: Results for orders placed or performed during the hospital encounter of 08/12/16 (from the past 24 hour(s))  CBC with Differential     Status: Abnormal   Collection Time: 08/12/16  6:30 PM  Result  Value Ref Range   WBC 8.3 4.0 - 10.5 K/uL   RBC 3.50 (L) 3.87 - 5.11 MIL/uL   Hemoglobin 9.0 (L) 12.0 - 15.0 g/dL   HCT 16.1 (L) 09.6 - 04.5 %   MCV 80.9 78.0 - 100.0 fL   MCH 25.7 (L) 26.0 - 34.0 pg  MCHC 31.8 30.0 - 36.0 g/dL   RDW 16.114.0 09.611.5 - 04.515.5 %   Platelets 217 150 - 400 K/uL   Neutrophils Relative % 57 %   Neutro Abs 4.7 1.7 - 7.7 K/uL   Lymphocytes Relative 32 %   Lymphs Abs 2.7 0.7 - 4.0 K/uL   Monocytes Relative 9 %   Monocytes Absolute 0.7 0.1 - 1.0 K/uL   Eosinophils Relative 2 %   Eosinophils Absolute 0.1 0.0 - 0.7 K/uL   Basophils Relative 0 %   Basophils Absolute 0.0 0.0 - 0.1 K/uL  Basic metabolic panel     Status: Abnormal   Collection Time: 08/12/16  6:30 PM  Result Value Ref Range   Sodium 137 135 - 145 mmol/L   Potassium 3.4 (L) 3.5 - 5.1 mmol/L   Chloride 109 101 - 111 mmol/L   CO2 21 (L) 22 - 32 mmol/L   Glucose, Bld 73 65 - 99 mg/dL   BUN <5 (L) 6 - 20 mg/dL   Creatinine, Ser 4.090.52 0.44 - 1.00 mg/dL   Calcium 8.8 (L) 8.9 - 10.3 mg/dL   GFR calc non Af Amer >60 >60 mL/min   GFR calc Af Amer >60 >60 mL/min   Anion gap 7 5 - 15   O/POS/-- (06/26 1401)  Imaging:  No results found.  MAU Course/MDM: I have ordered labs and reviewed results.  NST reviewed Consult Ferguson with presentation, exam findings and test results.  Treatments in MAU included fetal monitoring and exam was done by Dr Emelda FearFerguson. He sates the tenderness was in the abdominal wall and not the uterus. Placenta known to be posterior, so abruption not likely since we have monitored her for 5 hours and FHR has been reassuring.    Assessment: 1. Abdominal trauma, initial encounter   2. Premature uterine contractions   3.     Reassuring fetal heart rate tracing, Category I  Plan: Discharge home Labor precautions and fetal kick counts Follow up in Office for prenatal visits and recheck of fetal status  Encouraged to return here or to other Urgent Care/ED if she develops worsening of  symptoms, increase in pain, fever, or other concerning symptoms.    Pt stable at time of discharge.  Wynelle BourgeoisMarie Jackelynn Hosie CNM, MSN Certified Nurse-Midwife 08/12/2016 9:22 PM

## 2016-08-12 NOTE — Progress Notes (Signed)
Pincus BadderK. Shaw requested for MAU provider to evaluate patient instead. Report to MAU provider instead.

## 2016-08-12 NOTE — ED Triage Notes (Signed)
Pt arrives from home via [facility] after having a fall today onto her right side. Pt is [redacted] weeks pregnant and states she hasn't felt the baby move since she fell. [facility] called and is en route.

## 2016-08-12 NOTE — ED Provider Notes (Signed)
The patient is a 25 year old female, she has a history of being pregnant, [redacted] weeks along, otherwise, located, states that tonight she accidentally fell onto her right side, this was somewhat graceful but did impact her right abdomen. She has minimal tenderness in the right lower quadrant, she has normal heart tones, normal fetal heart tones, there is contractions on the tocometry monitoring. [facility] Emelda FearFerguson, I completed the paperwork, the patient is in agreement with the plan.  Final diagnoses:  Abdominal trauma, initial encounter  Premature uterine contractions     I saw and evaluated the patient, reviewed the resident's note and I agree with the findings and plan.      Eber HongBrian Patina Spanier, MD 08/15/16 0100

## 2016-08-14 ENCOUNTER — Inpatient Hospital Stay (HOSPITAL_COMMUNITY)
Admission: AD | Admit: 2016-08-14 | Discharge: 2016-08-14 | Disposition: A | Discharge status: Home / Self Care | Payer: Medicaid Other | Source: Ambulatory Visit | Attending: Obstetrics & Gynecology | Admitting: Obstetrics & Gynecology

## 2016-08-14 ENCOUNTER — Encounter (HOSPITAL_COMMUNITY): Payer: Self-pay | Admitting: Certified Nurse Midwife

## 2016-08-14 DIAGNOSIS — O368130 Decreased fetal movements, third trimester, not applicable or unspecified: Secondary | ICD-10-CM | POA: Insufficient documentation

## 2016-08-14 DIAGNOSIS — A5901 Trichomonal vulvovaginitis: Secondary | ICD-10-CM | POA: Diagnosis not present

## 2016-08-14 DIAGNOSIS — W1830XA Fall on same level, unspecified, initial encounter: Secondary | ICD-10-CM | POA: Insufficient documentation

## 2016-08-14 DIAGNOSIS — O98313 Other infections with a predominantly sexual mode of transmission complicating pregnancy, third trimester: Secondary | ICD-10-CM | POA: Diagnosis not present

## 2016-08-14 DIAGNOSIS — A599 Trichomoniasis, unspecified: Secondary | ICD-10-CM

## 2016-08-14 DIAGNOSIS — R109 Unspecified abdominal pain: Secondary | ICD-10-CM | POA: Insufficient documentation

## 2016-08-14 DIAGNOSIS — H543 Unqualified visual loss, both eyes: Secondary | ICD-10-CM

## 2016-08-14 DIAGNOSIS — O26893 Other specified pregnancy related conditions, third trimester: Secondary | ICD-10-CM | POA: Diagnosis not present

## 2016-08-14 DIAGNOSIS — Z3A36 36 weeks gestation of pregnancy: Secondary | ICD-10-CM | POA: Diagnosis not present

## 2016-08-14 DIAGNOSIS — H43399 Other vitreous opacities, unspecified eye: Secondary | ICD-10-CM | POA: Diagnosis not present

## 2016-08-14 DIAGNOSIS — Z7982 Long term (current) use of aspirin: Secondary | ICD-10-CM | POA: Diagnosis not present

## 2016-08-14 LAB — PROTEIN / CREATININE RATIO, URINE
Creatinine, Urine: 163 mg/dL
Protein Creatinine Ratio: 0.16 mg/mg{Cre} — ABNORMAL HIGH (ref 0.00–0.15)
Total Protein, Urine: 26 mg/dL

## 2016-08-14 LAB — URINALYSIS W MICROSCOPIC (NOT AT ARMC)
BILIRUBIN URINE: NEGATIVE
Glucose, UA: NEGATIVE mg/dL
KETONES UR: NEGATIVE mg/dL
Nitrite: NEGATIVE
PH: 6.5 (ref 5.0–8.0)
PROTEIN: NEGATIVE mg/dL
Specific Gravity, Urine: 1.015 (ref 1.005–1.030)

## 2016-08-14 LAB — COMPREHENSIVE METABOLIC PANEL
ALBUMIN: 2.7 g/dL — AB (ref 3.5–5.0)
ALK PHOS: 61 U/L (ref 38–126)
ALT: 8 U/L — AB (ref 14–54)
ANION GAP: 6 (ref 5–15)
AST: 15 U/L (ref 15–41)
BILIRUBIN TOTAL: 0.3 mg/dL (ref 0.3–1.2)
BUN: 5 mg/dL — AB (ref 6–20)
CALCIUM: 8.8 mg/dL — AB (ref 8.9–10.3)
CO2: 23 mmol/L (ref 22–32)
CREATININE: 0.55 mg/dL (ref 0.44–1.00)
Chloride: 108 mmol/L (ref 101–111)
GFR calc Af Amer: >60 mL/min (ref >60)
GFR calc non Af Amer: >60 mL/min (ref >60)
GLUCOSE: 87 mg/dL (ref 65–99)
Potassium: 3.4 mmol/L — ABNORMAL LOW (ref 3.5–5.1)
SODIUM: 137 mmol/L (ref 135–145)
TOTAL PROTEIN: 6.6 g/dL (ref 6.5–8.1)

## 2016-08-14 LAB — CBC
HEMATOCRIT: 27 % — AB (ref 36.0–46.0)
HEMOGLOBIN: 8.7 g/dL — AB (ref 12.0–15.0)
MCH: 25.9 pg — ABNORMAL LOW (ref 26.0–34.0)
MCHC: 32.2 g/dL (ref 30.0–36.0)
MCV: 80.4 fL (ref 78.0–100.0)
Platelets: 215 10*3/uL (ref 150–400)
RBC: 3.36 MIL/uL — AB (ref 3.87–5.11)
RDW: 14.3 % (ref 11.5–15.5)
WBC: 8.6 10*3/uL (ref 4.0–10.5)

## 2016-08-14 MED ORDER — ACETAMINOPHEN 325 MG PO TABS: 650.0000 mg | ORAL_TABLET | Freq: Once | ORAL | 0 refills | Status: AC

## 2016-08-14 MED ORDER — ACETAMINOPHEN 325 MG PO TABS: 650.0000 mg | ORAL_TABLET | Freq: Once | ORAL | Status: DC

## 2016-08-14 MED ORDER — METRONIDAZOLE 500 MG PO TABS
500.0000 mg | ORAL_TABLET | Freq: Two times a day (BID) | ORAL | 0 refills | Status: AC
Start: 2016-08-14 — End: 2016-08-21

## 2016-08-14 NOTE — Discharge Instructions (Signed)
Trichomoniasis Trichomoniasis is an infection caused by an organism called Trichomonas. The infection can affect both women and men. In women, the outer female genitalia and the vagina are affected. In men, the penis is mainly affected, but the prostate and other reproductive organs can also be involved. Trichomoniasis is a sexually transmitted infection (STI) and is most often passed to another person through sexual contact.  RISK FACTORS  Having unprotected sexual intercourse.  Having sexual intercourse with an infected partner. SIGNS AND SYMPTOMS  Symptoms of trichomoniasis in women include:  Abnormal gray-green frothy vaginal discharge.  Itching and irritation of the vagina.  Itching and irritation of the area outside the vagina. Symptoms of trichomoniasis in men include:   Penile discharge with or without pain.  Pain during urination. This results from inflammation of the urethra. DIAGNOSIS  Trichomoniasis may be found during a Pap test or physical exam. Your health care provider may use one of the following methods to help diagnose this infection:  Testing the pH of the vagina with a test tape.  Using a vaginal swab test that checks for the Trichomonas organism. A test is available that provides results within a few minutes.  Examining a urine sample.  Testing vaginal secretions. Your health care provider may test you for other STIs, including HIV. TREATMENT   You may be given medicine to fight the infection. Women should inform their health care provider if they could be or are pregnant. Some medicines used to treat the infection should not be taken during pregnancy.  Your health care provider may recommend over-the-counter medicines or creams to decrease itching or irritation.  Your sexual partner will need to be treated if infected.  Your health care provider may test you for infection again 3 months after treatment. HOME CARE INSTRUCTIONS   Take medicines only as  directed by your health care provider.  Take over-the-counter medicine for itching or irritation as directed by your health care provider.  Do not have sexual intercourse while you have the infection.  Women should not douche or wear tampons while they have the infection.  Discuss your infection with your partner. Your partner may have gotten the infection from you, or you may have gotten it from your partner.  Have your sex partner get examined and treated if necessary.  Practice safe, informed, and protected sex.  See your health care provider for other STI testing. SEEK MEDICAL CARE IF:   You still have symptoms after you finish your medicine.  You develop abdominal pain.  You have pain when you urinate.  You have bleeding after sexual intercourse.  You develop a rash.  Your medicine makes you sick or makes you throw up (vomit). MAKE SURE YOU:  Understand these instructions.  Will watch your condition.  Will get help right away if you are not doing well or get worse.   This information is not intended to replace advice given to you by your health care provider. Make sure you discuss any questions you have with your health care provider.   Document Released: 04/14/2001 Document Revised: 11/09/2014 Document Reviewed: 07/31/2013 Elsevier Interactive Patient Education 2016 Elsevier Inc.  

## 2016-08-14 NOTE — MAU Provider Note (Signed)
History     CSN: [id number]  Arrival date and time: 08/14/16 1246   First Provider Initiated Contact with Patient 08/14/16 1327      Chief Complaint  Patient presents with  . Eye Problem  . Decreased Fetal Movement   HPI  Patient is a 25 year old G4 P3 at 36 weeks and 0 days who presents with some right upper quadrant pain and blurred vision. She reports that she has been having these episodes of blurred vision and bright lights in her vision. It comes and goes and is not constant. She denies any headache congestion or swelling. She reports that she did fall 2 days ago under her right side. She did not hit her abdomen. Reports she was sore after that as her pain has been getting worse. Finally she does report some decreased fetal movement. She has no history of preeclampsia.  OB History    Gravida Para Term Preterm AB Living   4 3 3     3    SAB TAB Ectopic Multiple Live Births           3      Past Medical History:  Diagnosis Date  . Anemia   . Bipolar 1 disorder (HCC)   . Gonorrhea   . postpartum depression 2007  . Preeclampsia 2007  . Pregnancy    7 months  . Seizure [facility])     Past Surgical History:  Procedure Laterality Date  . NO PAST SURGERIES      Family History  Problem Relation Age of Onset  . Seizures Father   . Alcohol abuse Neg Hx   . Arthritis Neg Hx   . Asthma Neg Hx   . Birth defects Neg Hx   . Cancer Neg Hx   . COPD Neg Hx   . Depression Neg Hx   . Diabetes Neg Hx   . Drug abuse Neg Hx   . Early death Neg Hx   . Hearing loss Neg Hx   . Heart disease Neg Hx   . Hyperlipidemia Neg Hx   . Hypertension Neg Hx   . Kidney disease Neg Hx   . Learning disabilities Neg Hx   . Mental illness Neg Hx   . Mental retardation Neg Hx   . Miscarriages / Stillbirths Neg Hx   . Stroke Neg Hx   . Vision loss Neg Hx     Social History  Substance Use Topics  . Smoking status: Never Smoker  . Smokeless tobacco: Never Used  . Alcohol use No     Allergies: No Known Allergies  Facility-Administered Medications Prior to Admission  Medication Dose Route Frequency Provider Last Rate Last Dose  . Tdap (BOOSTRIX) injection 0.5 mL  0.5 mL Intramuscular Once Lorne Skeens, MD       Prescriptions Prior to Admission  Medication Sig Dispense Refill Last Dose  . aspirin EC 81 MG tablet Take 1 tablet (81 mg total) by mouth daily. Take after 12 weeks for prevention of preeclampsia later in pregnancy 300 tablet 2 08/14/2016 at Unknown time  . Prenatal Vit-Fe Fumarate-FA (PRENATAL VITAMIN) 27-0.8 MG TABS Take 1 tablet by mouth daily. 30 tablet 0 08/14/2016 at Unknown time    Review of Systems  Constitutional: Negative for chills and fever.  HENT: Negative for congestion, hearing loss and nosebleeds.   Eyes: Positive for blurred vision. Negative for double vision and photophobia.  Respiratory: Negative for cough and hemoptysis.   Cardiovascular: Negative for chest pain  and palpitations.  Gastrointestinal: Positive for abdominal pain. Negative for heartburn, nausea and vomiting.  Genitourinary: Negative for dysuria, frequency and urgency.  Musculoskeletal: Negative for back pain, myalgias and neck pain.  Skin: Negative for itching and rash.  Neurological: Negative for dizziness, tingling, focal weakness and headaches.  Endo/Heme/Allergies: Negative for environmental allergies. Does not bruise/bleed easily.  Psychiatric/Behavioral: Negative for depression and suicidal ideas.   Physical Exam   Blood pressure 122/70, pulse 99, temperature 97.7 F (36.5 C), temperature source Oral, resp. rate 18, last menstrual period 01/21/2016, unknown if currently breastfeeding.  Physical Exam  Constitutional: She is oriented to person, place, and time. She appears well-developed and well-nourished.  Cardiovascular: Normal rate, regular rhythm and normal heart sounds.   No murmur heard. Respiratory: Effort normal and breath sounds normal. No  respiratory distress. She has no wheezes.  GI: Soft. Bowel sounds are normal. She exhibits no distension. There is no tenderness. There is no rebound.  Gravid  Musculoskeletal: Normal range of motion.  Neurological: She is alert and oriented to person, place, and time.  Cranial nerves II through XII intact, normal coordination, 5 out of 5 strength bilateral upper and lower extremities. 1+ reflexes.  Skin: Skin is warm and dry.  Psychiatric: She has a normal mood and affect. Her behavior is normal.    MAU Course  Procedures  MDM In the MAU patient underwent observation as well as evaluation for any signs of dehydration or preeclampsia. She had a urinalysis which revealed no dehydration and a urine protein to creatinine ratio which was within normal limits. Urinalysis did show Trichomonas which she states she's had treated twice already. She states she is not having sex with anybody at this time. She's been treated with both the 2 g dose and the 7 day course.  Additionally during her stay here she states she continues to have spots in her vision. Her cranial nerve and complete neurologic exam all within normal limits. She denies any headache. She is able to ambulate without problem. She was evaluated for preeclampsia with CBC and CMP which are grossly unremarkable. Chest with patient that likely she would benefit from a consult with ophthalmology. She states she has an eye doctor whom she'll call and get an appointment with this week. She is okay with discharge home.  Assessment and Plan  #1: Floaters in her vision unclear etiology. Not associated with headache. Labs within normal limits for preeclampsia or dehydration. Advise follow-up with ophthalmologist. #2: Abdominal pain bruising from fall. Reproducible with palpation. Advise continued Tylenol use. #3: Trichomonas incidentally found on urinalysis. Plan for 7 day course of Flagyl at this time advised that all partners be treated.  Ernestina Pennaicholas  Stewart Pimenta 08/14/2016, 1:35 PM

## 2016-08-14 NOTE — MAU Note (Signed)
Pt states she has right sided sharp, shooting pain since 0030. Pt states she has some visual disturbances that she describes as "white floaters". Pt denies ctxs, LOF, or vaginal bleeding. Pt states she hasn't felt baby move since early this AM.

## 2016-08-15 LAB — CULTURE, OB URINE

## 2016-08-20 ENCOUNTER — Encounter: Payer: Self-pay | Admitting: Obstetrics and Gynecology

## 2016-08-31 ENCOUNTER — Telehealth: Payer: Self-pay | Admitting: *Deleted

## 2016-08-31 NOTE — Telephone Encounter (Signed)
Pt called with several concerns. She stated that today she has observed small amount of dark blood after urinating. This has happened twice and she has not had bleeding between episodes of urination. She also is having back pain. I advised pt that these [place] are within the realm of normal. She should come to the hospital if she has any of the following: bright red bleeding like a period which continues, strong UC's every 5 minutes for more than an hour, fluid leaking from vagina or decreased FM.  For the back pain she can take Tylenol and/or use a heating pad. She should keep office appt as scheduled tomorrow @ [phone].  Pt voiced understanding.

## 2016-09-01 ENCOUNTER — Ambulatory Visit (INDEPENDENT_AMBULATORY_CARE_PROVIDER_SITE_OTHER): Payer: Medicaid Other | Admitting: Obstetrics and Gynecology

## 2016-09-01 ENCOUNTER — Other Ambulatory Visit (HOSPITAL_COMMUNITY)
Admission: RE | Admit: 2016-09-01 | Discharge: 2016-09-01 | Disposition: A | Discharge status: Home / Self Care | Payer: Medicaid Other | Source: Ambulatory Visit | Attending: Obstetrics and Gynecology | Admitting: Obstetrics and Gynecology

## 2016-09-01 VITALS — BP 124/78 | HR 83 | Wt 180.9 lb

## 2016-09-01 DIAGNOSIS — Z3493 Encounter for supervision of normal pregnancy, unspecified, third trimester: Secondary | ICD-10-CM

## 2016-09-01 DIAGNOSIS — Z113 Encounter for screening for infections with a predominantly sexual mode of transmission: Secondary | ICD-10-CM | POA: Insufficient documentation

## 2016-09-01 DIAGNOSIS — O0930 Supervision of pregnancy with insufficient antenatal care, unspecified trimester: Secondary | ICD-10-CM | POA: Insufficient documentation

## 2016-09-01 DIAGNOSIS — O0933 Supervision of pregnancy with insufficient antenatal care, third trimester: Secondary | ICD-10-CM

## 2016-09-01 DIAGNOSIS — N9089 Other specified noninflammatory disorders of vulva and perineum: Secondary | ICD-10-CM

## 2016-09-01 DIAGNOSIS — O98313 Other infections with a predominantly sexual mode of transmission complicating pregnancy, third trimester: Secondary | ICD-10-CM

## 2016-09-01 DIAGNOSIS — A599 Trichomoniasis, unspecified: Secondary | ICD-10-CM

## 2016-09-01 DIAGNOSIS — O283 Abnormal ultrasonic finding on antenatal screening of mother: Secondary | ICD-10-CM

## 2016-09-01 DIAGNOSIS — O0993 Supervision of high risk pregnancy, unspecified, third trimester: Secondary | ICD-10-CM

## 2016-09-01 NOTE — Progress Notes (Signed)
Prenatal Visit Note Date: 09/01/2016 Clinic: [facility]  Subjective:  Deanna Niemannenika R Murray is a 25 y.o. [id number] at 491w4d being seen today for ongoing prenatal care.  She is currently monitored for the following issues for this high-risk pregnancy and has Abnormal Pap smear; Iron deficiency anemia; Seizure disorder (HCC); Bipolar disorder (HCC); Supervision of low-risk pregnancy; Benzodiazepine misuse; Trichomoniasis; Abnormal fetal ultrasound; and Insufficient prenatal care on her problem list.  Patient reports low belly pressure and some spotting yesterday. none today. .   Contractions: Regular. Vag. Bleeding: Scant.  Movement: Present. Denies leaking of fluid.   The following portions of the patient's history were reviewed and updated as appropriate: allergies, current medications, past family history, past medical history, past social history, past surgical history and problem list. Problem list updated.  Objective:   Vitals:   09/01/16 1047  BP: 124/78  Pulse: 83  Weight: 180 lb 14.4 oz (82.1 kg)    Fetal Status: Fetal Heart Rate (bpm): 128 Fundal Height: 37 cm Movement: Present  Presentation: Vertex  General:  Alert, oriented and cooperative. Patient is in no acute distress.  Skin: Skin is warm and dry. No rash noted.   Cardiovascular: Normal heart rate noted  Respiratory: Normal respiratory effort, no problems with respiration noted  Abdomen: Soft, gravid, appropriate for gestational age. Pain/Pressure: Present     Pelvic:  EGBUS with lesion at 7 o'clock that's slightly TTP, 1cm x 1cm slightly erythematous and appears to be healing; it is non weeping Vaginal vault: moderate amount of thin, watery white d/c that's slightly pink tinged Cervix: appears long, visually closed, very friable, nttp  Extremities: Normal range of motion.  Edema: None  Mental Status: Normal mood and affect. Normal behavior. Normal judgment and thought content.   Urinalysis:       Assessment and Plan:  Pregnancy: G4P3003 at 3291w4d  1. Supervision of high risk pregnancy in third trimester Routine care. Pt states she's undecided on BTL but definitely leaning towards it. I told her that it's too late to sign her papers today for a PPBTL so she'll need to come back for her PP visit to sign papers and set up her interval BTL if she doesn't want any more children.  - Culture, beta strep (group b only) - Cervicovaginal ancillary only  2. Insufficient prenatal care in third trimester Last official PNV was in late august  3. Trichomoniasis TOC today  5. Abnormal fetal ultrasound F/u u/s per MFM recs for f/u renal scheduled at today's visit - US MFM OB FOLLOW UP; Future  6. Vulvar lesion Pt states she noticed it about 5 days ago, no prior h/o of it. HSV culture tube swab snapped in half and sharp plastic end used to slightly unroof lesion and was swabbed on this lesion and on cotton part. No other lesions seen on speculum exam  - Herpes simplex virus culture  Preterm labor symptoms and general obstetric precautions including but not limited to vaginal bleeding, contractions, leaking of fluid and fetal movement were reviewed in detail with the patient. Please refer to After Visit Summary for other counseling recommendations.  Return in about 1 week (around 09/08/2016).   Cornland Bingharlie Myiesha Edgar, MD

## 2016-09-02 LAB — CERVICOVAGINAL ANCILLARY ONLY
CHLAMYDIA, DNA PROBE: NEGATIVE
NEISSERIA GONORRHEA: NEGATIVE
Trichomonas: POSITIVE — AB

## 2016-09-03 ENCOUNTER — Encounter: Payer: Self-pay | Admitting: Obstetrics and Gynecology

## 2016-09-03 ENCOUNTER — Telehealth: Payer: Self-pay | Admitting: Obstetrics and Gynecology

## 2016-09-03 DIAGNOSIS — B009 Herpesviral infection, unspecified: Secondary | ICD-10-CM | POA: Insufficient documentation

## 2016-09-03 LAB — HERPES SIMPLEX VIRUS CULTURE: Organism ID, Bacteria: DETECTED

## 2016-09-03 LAB — CULTURE, BETA STREP (GROUP B ONLY)

## 2016-09-03 NOTE — Telephone Encounter (Signed)
OB Telephone Note 09/03/2016 Time: 1558  Patient called again at [phone] and same as before: went to straight to generic VM. VM left again stating for her to call the GYN clinic ASAP. Inbasket message to nurses to try and get them to get in contact with her and send a letter if not able to.    Cornelia Copaharlie Daeshaun Specht, Jr MD Attending [facility])

## 2016-09-03 NOTE — Telephone Encounter (Signed)
OB Telephone Note 09/03/2016 1403  Patient called at [phone] and it went straight to a generic VM. HSV swab came back + and patient will need to be on treatment valtrex and we need to schedule her a primary c-section at 39wks ASAP b/c of vulvar HSV+ lesion.  Will try again later today.  Cornelia Copaharlie Abdel Effinger, Jr MD Attending [facility])

## 2016-09-04 ENCOUNTER — Telehealth: Payer: Self-pay

## 2016-09-04 MED ORDER — METRONIDAZOLE 500 MG PO TABS
2000.0000 mg | ORAL_TABLET | Freq: Once | ORAL | 0 refills | Status: AC
Start: 2016-09-04 — End: 2016-09-04

## 2016-09-04 MED ORDER — ONDANSETRON HCL 8 MG PO TABS: 8.0000 mg | ORAL_TABLET | Freq: Once | ORAL | 0 refills | Status: AC

## 2016-09-04 NOTE — Telephone Encounter (Signed)
Attempted to reach patient voicemail pick up. I have left a message for patient to call us back if we do not answer she should let us know if we can leave a detail message.   Please send her in flagyl 2gm PO x 1 and zofran 8mg  PO x 1 and tell her to take the zofran 5338m prior to the flagyl.   Please call her and tell her that the lesion did come back as herpes. Please send her in valtrex 1gm bid x 7d and then tell her to take valtrex 1gm qday for suppression after that.

## 2016-09-04 NOTE — Telephone Encounter (Signed)
Per Dr. Vergie LivingPickens, pt needs Flagyl 2gm po x 1 and Zofran 8 mg po x 1 to take 30 min prior to taking Flagyl medication for tx of Trich.  LM for pt to return call back in regards to results.

## 2016-09-05 ENCOUNTER — Telehealth: Payer: Self-pay | Admitting: Obstetrics and Gynecology

## 2016-09-05 NOTE — Telephone Encounter (Signed)
OB Telephone Note 09/05/2016 Time: 1124am  Patient called again at [phone] and, again, went straight to VM. VM left for patient to please call the hospital and ask Dr. Cindy HazyPickens  Nyeli Holtmeyer, Jr MD Attending [facility](Faculty Practice)

## 2016-09-07 NOTE — Telephone Encounter (Signed)
Called patient to inform her that she needs csection per Dr. Vergie LivingPickens. No answer. Left message to call us back.

## 2016-09-08 ENCOUNTER — Inpatient Hospital Stay (HOSPITAL_COMMUNITY)
Admission: AD | Admit: 2016-09-08 | Discharge: 2016-09-11 | DRG: 766 | Disposition: A | Discharge status: Home / Self Care | Payer: Medicaid Other | Source: Ambulatory Visit | Attending: Family Medicine | Admitting: Family Medicine

## 2016-09-08 ENCOUNTER — Encounter (HOSPITAL_COMMUNITY)
Admission: AD | Disposition: A | Discharge status: Home / Self Care | Payer: Self-pay | Source: Ambulatory Visit | Attending: Family Medicine

## 2016-09-08 ENCOUNTER — Ambulatory Visit (INDEPENDENT_AMBULATORY_CARE_PROVIDER_SITE_OTHER): Payer: Medicaid Other | Admitting: Advanced Practice Midwife

## 2016-09-08 ENCOUNTER — Inpatient Hospital Stay (HOSPITAL_COMMUNITY): Payer: Medicaid Other | Admitting: Anesthesiology

## 2016-09-08 ENCOUNTER — Encounter (HOSPITAL_COMMUNITY): Payer: Self-pay | Admitting: Anesthesiology

## 2016-09-08 ENCOUNTER — Ambulatory Visit (HOSPITAL_COMMUNITY): Admission: RE | Admit: 2016-09-08 | Payer: Medicaid Other | Source: Ambulatory Visit

## 2016-09-08 ENCOUNTER — Telehealth: Payer: Self-pay

## 2016-09-08 VITALS — BP 122/80 | HR 87 | Wt 193.3 lb

## 2016-09-08 DIAGNOSIS — A5901 Trichomonal vulvovaginitis: Secondary | ICD-10-CM | POA: Diagnosis present

## 2016-09-08 DIAGNOSIS — O283 Abnormal ultrasonic finding on antenatal screening of mother: Secondary | ICD-10-CM | POA: Diagnosis present

## 2016-09-08 DIAGNOSIS — Z3A39 39 weeks gestation of pregnancy: Secondary | ICD-10-CM

## 2016-09-08 DIAGNOSIS — F319 Bipolar disorder, unspecified: Secondary | ICD-10-CM | POA: Diagnosis present

## 2016-09-08 DIAGNOSIS — O9081 Anemia of the puerperium: Secondary | ICD-10-CM | POA: Diagnosis not present

## 2016-09-08 DIAGNOSIS — D649 Anemia, unspecified: Secondary | ICD-10-CM | POA: Diagnosis not present

## 2016-09-08 DIAGNOSIS — Z87891 Personal history of nicotine dependence: Secondary | ICD-10-CM | POA: Diagnosis not present

## 2016-09-08 DIAGNOSIS — Z7982 Long term (current) use of aspirin: Secondary | ICD-10-CM | POA: Diagnosis not present

## 2016-09-08 DIAGNOSIS — A6009 Herpesviral infection of other urogenital tract: Secondary | ICD-10-CM

## 2016-09-08 DIAGNOSIS — O99344 Other mental disorders complicating childbirth: Secondary | ICD-10-CM | POA: Diagnosis present

## 2016-09-08 DIAGNOSIS — A6000 Herpesviral infection of urogenital system, unspecified: Secondary | ICD-10-CM | POA: Diagnosis present

## 2016-09-08 DIAGNOSIS — O0993 Supervision of high risk pregnancy, unspecified, third trimester: Secondary | ICD-10-CM

## 2016-09-08 DIAGNOSIS — O9832 Other infections with a predominantly sexual mode of transmission complicating childbirth: Secondary | ICD-10-CM | POA: Diagnosis present

## 2016-09-08 DIAGNOSIS — B009 Herpesviral infection, unspecified: Secondary | ICD-10-CM | POA: Diagnosis present

## 2016-09-08 DIAGNOSIS — A599 Trichomoniasis, unspecified: Secondary | ICD-10-CM | POA: Diagnosis present

## 2016-09-08 HISTORY — DX: Anxiety disorder, unspecified: F41.9

## 2016-09-08 HISTORY — DX: Headache: R51

## 2016-09-08 HISTORY — DX: Headache, unspecified: R51.9

## 2016-09-08 HISTORY — DX: Herpesviral infection, unspecified: B00.9

## 2016-09-08 LAB — CBC
HCT: 26.8 % — ABNORMAL LOW (ref 36.0–46.0)
Hemoglobin: 8.5 g/dL — ABNORMAL LOW (ref 12.0–15.0)
MCH: 24.7 pg — AB (ref 26.0–34.0)
MCHC: 31.7 g/dL (ref 30.0–36.0)
MCV: 77.9 fL — ABNORMAL LOW (ref 78.0–100.0)
PLATELETS: 221 10*3/uL (ref 150–400)
RBC: 3.44 MIL/uL — ABNORMAL LOW (ref 3.87–5.11)
RDW: 14.9 % (ref 11.5–15.5)
WBC: 8.3 10*3/uL (ref 4.0–10.5)

## 2016-09-08 LAB — TYPE AND SCREEN
ABO/RH(D): O POS
ANTIBODY SCREEN: NEGATIVE

## 2016-09-08 SURGERY — Surgical Case
Anesthesia: Epidural

## 2016-09-08 MED ORDER — NALBUPHINE HCL 10 MG/ML IJ SOLN
INTRAMUSCULAR | Status: AC
  Filled 2016-09-08: qty 1

## 2016-09-08 MED ORDER — MENTHOL 3 MG MT LOZG: 1.0000 | LOZENGE | OROMUCOSAL | Status: DC | PRN

## 2016-09-08 MED ORDER — WITCH HAZEL-GLYCERIN EX PADS: 1.0000 "application " | MEDICATED_PAD | CUTANEOUS | Status: DC | PRN

## 2016-09-08 MED ORDER — OXYTOCIN 10 UNIT/ML IJ SOLN
INTRAMUSCULAR | Status: AC
  Filled 2016-09-08: qty 4

## 2016-09-08 MED ORDER — PRENATAL MULTIVITAMIN CH
1.0000 | ORAL_TABLET | Freq: Every day | ORAL | Status: DC
  Administered 2016-09-09 – 2016-09-11 (×3): 1 via ORAL
  Filled 2016-09-08 (×3): qty 1

## 2016-09-08 MED ORDER — ZOLPIDEM TARTRATE 5 MG PO TABS: 5.0000 mg | ORAL_TABLET | Freq: Every evening | ORAL | Status: DC | PRN

## 2016-09-08 MED ORDER — LACTATED RINGERS IV SOLN: INTRAVENOUS | Status: DC

## 2016-09-08 MED ORDER — OXYTOCIN 10 UNIT/ML IJ SOLN
INTRAVENOUS | Status: DC | PRN
  Administered 2016-09-08: 40 [IU] via INTRAVENOUS

## 2016-09-08 MED ORDER — ONDANSETRON HCL 4 MG/2ML IJ SOLN
INTRAMUSCULAR | Status: AC
  Filled 2016-09-08: qty 2

## 2016-09-08 MED ORDER — OXYCODONE HCL 5 MG PO TABS
5.0000 mg | ORAL_TABLET | ORAL | Status: DC | PRN
  Administered 2016-09-09 (×3): 5 mg via ORAL
  Filled 2016-09-08 (×3): qty 1

## 2016-09-08 MED ORDER — PROMETHAZINE HCL 25 MG/ML IJ SOLN: 6.2500 mg | INTRAMUSCULAR | Status: DC | PRN

## 2016-09-08 MED ORDER — TETANUS-DIPHTH-ACELL PERTUSSIS 5-2.5-18.5 LF-MCG/0.5 IM SUSP: 0.5000 mL | Freq: Once | INTRAMUSCULAR | Status: DC

## 2016-09-08 MED ORDER — MORPHINE SULFATE-NACL 0.5-0.9 MG/ML-% IV SOSY
PREFILLED_SYRINGE | INTRAVENOUS | Status: AC
  Filled 2016-09-08: qty 1

## 2016-09-08 MED ORDER — CEFAZOLIN SODIUM-DEXTROSE 2-3 GM-% IV SOLR
INTRAVENOUS | Status: DC | PRN
  Administered 2016-09-08: 2 g via INTRAVENOUS

## 2016-09-08 MED ORDER — COCONUT OIL OIL: 1.0000 "application " | TOPICAL_OIL | Status: DC | PRN

## 2016-09-08 MED ORDER — MEPERIDINE HCL 25 MG/ML IJ SOLN: 6.2500 mg | INTRAMUSCULAR | Status: DC | PRN

## 2016-09-08 MED ORDER — LACTATED RINGERS IV SOLN
INTRAVENOUS | Status: DC
Start: 2016-09-08 — End: 2016-09-08
  Administered 2016-09-08 (×2): via INTRAVENOUS

## 2016-09-08 MED ORDER — SENNOSIDES-DOCUSATE SODIUM 8.6-50 MG PO TABS
2.0000 | ORAL_TABLET | ORAL | Status: DC
  Administered 2016-09-09 – 2016-09-11 (×3): 2 via ORAL
  Filled 2016-09-08 (×3): qty 2

## 2016-09-08 MED ORDER — KETOROLAC TROMETHAMINE 30 MG/ML IJ SOLN
30.0000 mg | Freq: Four times a day (QID) | INTRAMUSCULAR | Status: DC | PRN
  Administered 2016-09-08: 30 mg via INTRAMUSCULAR

## 2016-09-08 MED ORDER — SIMETHICONE 80 MG PO CHEW
80.0000 mg | CHEWABLE_TABLET | ORAL | Status: DC
  Administered 2016-09-09 – 2016-09-11 (×3): 80 mg via ORAL
  Filled 2016-09-08 (×3): qty 1

## 2016-09-08 MED ORDER — MORPHINE SULFATE (PF) 0.5 MG/ML IJ SOLN
INTRAMUSCULAR | Status: DC | PRN
  Administered 2016-09-08: .2 mg via INTRATHECAL

## 2016-09-08 MED ORDER — PHENYLEPHRINE 8 MG IN D5W 100 ML (0.08MG/ML) PREMIX OPTIME
INJECTION | INTRAVENOUS | Status: DC | PRN
Start: 2016-09-08 — End: 2016-09-08
  Administered 2016-09-08: 60 ug/min via INTRAVENOUS

## 2016-09-08 MED ORDER — IBUPROFEN 600 MG PO TABS
600.0000 mg | ORAL_TABLET | Freq: Four times a day (QID) | ORAL | Status: DC
  Administered 2016-09-09 – 2016-09-11 (×12): 600 mg via ORAL
  Filled 2016-09-08 (×12): qty 1

## 2016-09-08 MED ORDER — SCOPOLAMINE 1 MG/3DAYS TD PT72
MEDICATED_PATCH | TRANSDERMAL | Status: AC
  Filled 2016-09-08: qty 1

## 2016-09-08 MED ORDER — KETOROLAC TROMETHAMINE 30 MG/ML IJ SOLN
INTRAMUSCULAR | Status: AC
  Filled 2016-09-08: qty 1

## 2016-09-08 MED ORDER — FENTANYL CITRATE (PF) 100 MCG/2ML IJ SOLN
INTRAMUSCULAR | Status: DC | PRN
  Administered 2016-09-08: 20 ug via INTRATHECAL

## 2016-09-08 MED ORDER — OXYTOCIN 40 UNITS IN LACTATED RINGERS INFUSION - SIMPLE MED: 2.5000 [IU]/h | INTRAVENOUS | Status: AC

## 2016-09-08 MED ORDER — CEFAZOLIN SODIUM-DEXTROSE 2-4 GM/100ML-% IV SOLN
INTRAVENOUS | Status: AC
  Filled 2016-09-08: qty 100

## 2016-09-08 MED ORDER — OXYCODONE HCL 5 MG PO TABS: 10.0000 mg | ORAL_TABLET | ORAL | Status: DC | PRN

## 2016-09-08 MED ORDER — DIBUCAINE 1 % RE OINT: 1.0000 "application " | TOPICAL_OINTMENT | RECTAL | Status: DC | PRN

## 2016-09-08 MED ORDER — METRONIDAZOLE 500 MG PO TABS
2000.0000 mg | ORAL_TABLET | Freq: Once | ORAL | Status: AC
  Administered 2016-09-08: 2000 mg via ORAL
  Filled 2016-09-08: qty 4

## 2016-09-08 MED ORDER — DIPHENHYDRAMINE HCL 25 MG PO CAPS
25.0000 mg | ORAL_CAPSULE | Freq: Four times a day (QID) | ORAL | Status: DC | PRN
  Administered 2016-09-08: 25 mg via ORAL
  Filled 2016-09-08: qty 1

## 2016-09-08 MED ORDER — LACTATED RINGERS IV SOLN
INTRAVENOUS | Status: DC | PRN
  Administered 2016-09-08: 17:00:00 via INTRAVENOUS

## 2016-09-08 MED ORDER — KETOROLAC TROMETHAMINE 30 MG/ML IJ SOLN: 30.0000 mg | Freq: Four times a day (QID) | INTRAMUSCULAR | Status: AC | PRN

## 2016-09-08 MED ORDER — LACTATED RINGERS IV SOLN
INTRAVENOUS | Status: DC
Start: 2016-09-08 — End: 2016-09-08

## 2016-09-08 MED ORDER — SIMETHICONE 80 MG PO CHEW: 80.0000 mg | CHEWABLE_TABLET | ORAL | Status: DC | PRN

## 2016-09-08 MED ORDER — BUPIVACAINE IN DEXTROSE 0.75-8.25 % IT SOLN
INTRATHECAL | Status: DC | PRN
  Administered 2016-09-08: 1.4 mL via INTRATHECAL

## 2016-09-08 MED ORDER — BUPIVACAINE HCL (PF) 0.25 % IJ SOLN
INTRAMUSCULAR | Status: DC | PRN
  Administered 2016-09-08: 30 mL

## 2016-09-08 MED ORDER — BUPIVACAINE HCL (PF) 0.25 % IJ SOLN
INTRAMUSCULAR | Status: AC
  Filled 2016-09-08: qty 30

## 2016-09-08 MED ORDER — NALBUPHINE HCL 10 MG/ML IJ SOLN: 5.0000 mg | INTRAMUSCULAR | Status: DC | PRN

## 2016-09-08 MED ORDER — ACYCLOVIR 200 MG PO CAPS
400.0000 mg | ORAL_CAPSULE | Freq: Three times a day (TID) | ORAL | Status: DC
  Administered 2016-09-08 – 2016-09-11 (×8): 400 mg via ORAL
  Filled 2016-09-08 (×13): qty 2

## 2016-09-08 MED ORDER — SCOPOLAMINE 1 MG/3DAYS TD PT72
1.0000 | MEDICATED_PATCH | Freq: Once | TRANSDERMAL | Status: DC
  Administered 2016-09-08: 1.5 mg via TRANSDERMAL

## 2016-09-08 MED ORDER — FENTANYL CITRATE (PF) 100 MCG/2ML IJ SOLN
INTRAMUSCULAR | Status: AC
  Filled 2016-09-08: qty 2

## 2016-09-08 MED ORDER — CEFAZOLIN SODIUM-DEXTROSE 2-4 GM/100ML-% IV SOLN: 2.0000 g | INTRAVENOUS | Status: DC

## 2016-09-08 MED ORDER — ONDANSETRON HCL 4 MG/2ML IJ SOLN
INTRAMUSCULAR | Status: DC | PRN
  Administered 2016-09-08: 4 mg via INTRAVENOUS

## 2016-09-08 MED ORDER — ACETAMINOPHEN 325 MG PO TABS
650.0000 mg | ORAL_TABLET | ORAL | Status: DC | PRN
  Administered 2016-09-10: 650 mg via ORAL
  Filled 2016-09-08: qty 2

## 2016-09-08 MED ORDER — BUPIVACAINE IN DEXTROSE 0.75-8.25 % IT SOLN
INTRATHECAL | Status: AC
  Filled 2016-09-08: qty 2

## 2016-09-08 MED ORDER — KETOROLAC TROMETHAMINE 30 MG/ML IJ SOLN: 30.0000 mg | Freq: Once | INTRAMUSCULAR | Status: DC

## 2016-09-08 MED ORDER — SIMETHICONE 80 MG PO CHEW
80.0000 mg | CHEWABLE_TABLET | Freq: Three times a day (TID) | ORAL | Status: DC
  Administered 2016-09-09 – 2016-09-11 (×8): 80 mg via ORAL
  Filled 2016-09-08 (×8): qty 1

## 2016-09-08 MED ORDER — HYDROMORPHONE HCL 1 MG/ML IJ SOLN: 0.2500 mg | INTRAMUSCULAR | Status: DC | PRN

## 2016-09-08 MED ORDER — NALBUPHINE HCL 10 MG/ML IJ SOLN
5.0000 mg | INTRAMUSCULAR | Status: DC | PRN
  Administered 2016-09-08: 5 mg via SUBCUTANEOUS

## 2016-09-08 SURGICAL SUPPLY — 29 items
BENZOIN TINCTURE PRP APPL 2/3 (GAUZE/BANDAGES/DRESSINGS) ×3 IMPLANT
CHLORAPREP W/TINT 26ML (MISCELLANEOUS) ×3 IMPLANT
CLAMP CORD UMBIL (MISCELLANEOUS) IMPLANT
CLOSURE STERI STRIP 1/2 X4 (GAUZE/BANDAGES/DRESSINGS) ×3 IMPLANT
CLOTH BEACON ORANGE TIMEOUT ST (SAFETY) ×3 IMPLANT
DRSG OPSITE POSTOP 4X10 (GAUZE/BANDAGES/DRESSINGS) ×3 IMPLANT
ELECT REM PT RETURN 9FT ADLT (ELECTROSURGICAL) ×3
ELECTRODE REM PT RTRN 9FT ADLT (ELECTROSURGICAL) ×1 IMPLANT
EXTRACTOR VACUUM M CUP 4 TUBE (SUCTIONS) IMPLANT
EXTRACTOR VACUUM M CUP 4' TUBE (SUCTIONS)
GLOVE BIOGEL PI IND STRL 7.0 (GLOVE) ×2 IMPLANT
GLOVE BIOGEL PI INDICATOR 7.0 (GLOVE) ×4
GLOVE ECLIPSE 7.0 STRL STRAW (GLOVE) ×6 IMPLANT
GOWN STRL REUS W/TWL LRG LVL3 (GOWN DISPOSABLE) ×6 IMPLANT
KIT ABG SYR 3ML LUER SLIP (SYRINGE) IMPLANT
NEEDLE HYPO 22GX1.5 SAFETY (NEEDLE) ×3 IMPLANT
NEEDLE HYPO 25X5/8 SAFETYGLIDE (NEEDLE) IMPLANT
NS IRRIG 1000ML POUR BTL (IV SOLUTION) ×3 IMPLANT
PACK C SECTION WH (CUSTOM PROCEDURE TRAY) ×3 IMPLANT
PAD ABD 8X7 1/2 STERILE (GAUZE/BANDAGES/DRESSINGS) ×3 IMPLANT
PAD OB MATERNITY 4.3X12.25 (PERSONAL CARE ITEMS) ×3 IMPLANT
PENCIL SMOKE EVAC W/HOLSTER (ELECTROSURGICAL) ×3 IMPLANT
RTRCTR C-SECT PINK 25CM LRG (MISCELLANEOUS) ×3 IMPLANT
SUT VIC AB 0 CTX 36 (SUTURE) ×6
SUT VIC AB 0 CTX36XBRD ANBCTRL (SUTURE) ×3 IMPLANT
SUT VIC AB 4-0 KS 27 (SUTURE) ×3 IMPLANT
SYR 30ML LL (SYRINGE) ×3 IMPLANT
TOWEL OR 17X24 6PK STRL BLUE (TOWEL DISPOSABLE) ×3 IMPLANT
TRAY FOLEY CATH SILVER 14FR (SET/KITS/TRAYS/PACK) ×3 IMPLANT

## 2016-09-08 NOTE — Transfer of Care (Signed)
Immediate Anesthesia Transfer of Care Note  Patient: Deanna Murray  Procedure(s) Performed: Procedure(s): CESAREAN SECTION (N/A)  Patient Location: PACU  Anesthesia Type:Spinal  Level of Consciousness: awake, alert  and oriented  Airway & Oxygen Therapy: Patient Spontanous Breathing  Post-op Assessment: Report given to RN and Post -op Vital signs reviewed and stable  Post vital signs: Reviewed and stable  Last Vitals:  Vitals:   09/08/16 1532  BP: 125/83  Pulse: 85  Resp: 18  Temp: 36.7 C    Last Pain:  Vitals:   09/08/16 1532  TempSrc: Oral      Patients Stated Pain Goal: 3 (09/08/16 1532)  Complications: No apparent anesthesia complications

## 2016-09-08 NOTE — Anesthesia Preprocedure Evaluation (Signed)
Anesthesia Evaluation  Patient identified by MRN, date of birth, ID band Patient awake    Reviewed: Allergy & Precautions, H&P , NPO status , Patient's Chart, lab work & pertinent test results  Airway Mallampati: I  TM Distance: >3 FB Neck ROM: full    Dental no notable dental hx.    Pulmonary neg pulmonary ROS, former smoker,    Pulmonary exam normal        Cardiovascular hypertension, negative cardio ROS Normal cardiovascular exam     Neuro/Psych    GI/Hepatic negative GI ROS, Neg liver ROS,   Endo/Other  negative endocrine ROS  Renal/GU negative Renal ROS     Musculoskeletal   Abdominal (+) + obese,   Peds  Hematology   Anesthesia Other Findings   Reproductive/Obstetrics (+) Pregnancy                             Anesthesia Physical Anesthesia Plan  ASA: II  Anesthesia Plan: Epidural and Spinal   Post-op Pain Management:    Induction:   Airway Management Planned:   Additional Equipment:   Intra-op Plan:   Post-operative Plan:   Informed Consent: I have reviewed the patients History and Physical, chart, labs and discussed the procedure including the risks, benefits and alternatives for the proposed anesthesia with the patient or authorized representative who has indicated his/her understanding and acceptance.     Plan Discussed with: CRNA and Surgeon  Anesthesia Plan Comments:         Anesthesia Quick Evaluation

## 2016-09-08 NOTE — Anesthesia Procedure Notes (Signed)
Spinal  Patient location during procedure: OR Start time: 09/08/2016 4:45 PM End time: 09/08/2016 4:49 PM Staffing Anesthesiologist: Leilani AbleHATCHETT, Santresa Levett Performed: anesthesiologist  Preanesthetic Checklist Completed: patient identified, surgical consent, pre-op evaluation, timeout performed, IV checked, risks and benefits discussed and monitors and equipment checked Spinal Block Patient position: sitting Prep: site prepped and draped and DuraPrep Patient monitoring: heart rate, cardiac monitor, continuous pulse ox and blood pressure Approach: midline Location: L3-4 Injection technique: single-shot Needle Needle type: Pencan  Needle gauge: 24 G Needle length: 9 cm Needle insertion depth: 5 cm Assessment Sensory level: T4

## 2016-09-08 NOTE — Telephone Encounter (Signed)
Patient has been informed of test results.  

## 2016-09-08 NOTE — Op Note (Signed)
Cesarean Section Operative Report  Deanna Murray  09/08/2016  Indications: active primary hsv genitalis   Pre-operative Diagnosis: primary cesarean section for HSV outbreak.   Post-operative Diagnosis: Same   Surgeon: Surgeon(s) and Role:    * Reva Boresanya S Pratt, MD - Primary    * Kathrynn RunningNoah Bedford Krystyn Picking, MD - Assisting   Attending Attestation: I was present and scrubbed for the entire procedure.   Anesthesia: spinal    Estimated Blood Loss: 650 ml  Total IV Fluids: 2300 ml LR  Urine Output:: 150 ml clear yellow urine  Specimens: none  Findings: Viable  infant in cephalic presentation; Apgars 8/9; weight pending g; arterial cord pH not obtained; clear amniotic fluid; intact placenta with three vessel cord; normal uterus, fallopian tubes and ovaries bilaterally.  Baby condition / location:  Couplet care / Skin to Skin   Complications: no complications  Indications: Deanna Murray is a 25 y.o. [id number] with an IUP 5443w4d presenting for primary c/s for active primary hsv genitalis at term.  The risks, benefits, complications, treatment options, and exected outcomes were discussed with the patient . The patient dwith the proposed plan, giving informed consent. identified as Deanna Murray and the procedure verified as C-Section Delivery.  Procedure Details:  The patient was taken back to the operative suite where spinal anesthesia was placed.  A time out was held and the above information confirmed.   After induction of anesthesia, the patient was draped and prepped in the usual sterile manner and placed in a dorsal supine position with a leftward tilt. A Pfannenstiel incision was made and carried down through the subcutaneous tissue to the fascia. Fascial incision was made and bluntly extended transversely. The fascia was separated from the underlying rectus tissue superiorly and inferiorly. The peritoneum was identified and bluntly entered and extended longitudinally. Alexis  retractor was placed. A low transverse uterine incision was made and extended bluntly. Delivered from cephalic presentation was a viable infant with Apgars and weight as above.  After waiting 60 seconds for delayed cord cutting, the umbilical cord was clamped and cut cord blood was obtained for evaluation. Cord ph was not sent. The placenta was removed Intact and appeared normal. The uterine outline, tubes and ovaries appeared normal. The uterine incision was closed with running locked sutures of 4-0Vicryl with an imbricating layer of the same.   Hemostasis was observed after placement of one figure of eight 0 vicryl stitch. The peritoneum was closed with 0 vicryl. The rectus muscles were examined and hemostasis observed. The fascia was then reapproximated with running sutures of 0Vicryl. A total of 30 ml 0.25% Marcaine was injected subcutaneously at the margins of the incision.  The skin was closed with 4-0Vicryl.   Instrument, sponge, and needle counts were correct prior the abdominal closure and were correct at the conclusion of the case.     Disposition: PACU - hemodynamically stable.   Maternal Condition: stable       Signed: Lavonne Chickoah B WoukMD 09/08/2016 6:07 PM

## 2016-09-08 NOTE — H&P (Signed)
Deanna Murray is an 25 y.o. [phone] 6513w4d female.    Chief Complaint: Primary HSV outbreak  HPI: Here at 4639 + wks, less than 1 wk into a primary HSV outbreak, without treatment for primary C-section due to advanced gestational age and risk to fetus.  Past Medical History:  Diagnosis Date  . Anemia   . Bipolar 1 disorder (HCC)   . Gonorrhea   . postpartum depression 2007  . Preeclampsia 2007  . Pregnancy    7 months  . Seizure [facility](HCC)     Past Surgical History:  Procedure Laterality Date  . NO PAST SURGERIES      Family History  Problem Relation Age of Onset  . Seizures Father   . Alcohol abuse Neg Hx   . Arthritis Neg Hx   . Asthma Neg Hx   . Birth defects Neg Hx   . Cancer Neg Hx   . COPD Neg Hx   . Depression Neg Hx   . Diabetes Neg Hx   . Drug abuse Neg Hx   . Early death Neg Hx   . Hearing loss Neg Hx   . Heart disease Neg Hx   . Hyperlipidemia Neg Hx   . Hypertension Neg Hx   . Kidney disease Neg Hx   . Learning disabilities Neg Hx   . Mental illness Neg Hx   . Mental retardation Neg Hx   . Miscarriages / Stillbirths Neg Hx   . Stroke Neg Hx   . Vision loss Neg Hx    Social History:  reports that she has never smoked. She has never used smokeless tobacco. She reports that she does not drink alcohol or use drugs.   No Known Allergies  No current facility-administered medications on file prior to encounter.    Current Outpatient Prescriptions on File Prior to Encounter  Medication Sig Dispense Refill  . aspirin EC 81 MG tablet Take 1 tablet (81 mg total) by mouth daily. Take after 12 weeks for prevention of preeclampsia later in pregnancy 300 tablet 2  . Prenatal Vit-Fe Fumarate-FA (PRENATAL VITAMIN) 27-0.8 MG TABS Take 1 tablet by mouth daily. 30 tablet 0    A comprehensive review of systems was negative.  Last menstrual period 01/21/2016, unknown if currently breastfeeding. LMP 01/21/2016  General appearance: alert, cooperative and appears stated  age Head: Normocephalic, without obvious abnormality, atraumatic Neck: supple, symmetrical, trachea midline Lungs: normal effort Heart: regular rate and rhythm Abdomen: gravid, NT Extremities: Homans sign is negative, no sign of DVT Skin: Skin color, texture, turgor normal. No rashes or lesions Neurologic: Grossly normal   Lab Results  Component Value Date   WBC 8.6 08/14/2016   HGB 8.7 (L) 08/14/2016   HCT 27.0 (L) 08/14/2016   MCV 80.4 08/14/2016   PLT 215 08/14/2016         ABO, Rh: O/POS/-- (06/26 1401)  Antibody: NEG (06/26 1401)  Rubella: !Error!  RPR: NON REAC (08/31 1636)  HBsAg: NEGATIVE (06/26 1401)  HIV: NONREACTIVE (08/31 1636)  GBS:       Assessment/Plan Principal Problem:   HSV-1 infection Active Problems:   Trichomoniasis   Abnormal fetal ultrasound  For primary elective C-section. Risks include but are not limited to bleeding, infection, injury to surrounding structures, including bowel, bladder and ureters, blood clots, and death.  Likelihood of success is high.   Reva Boresanya S Pratt 09/08/2016, 2:37 PM

## 2016-09-08 NOTE — Telephone Encounter (Signed)
Called pt and the call went straight to voice mail. No message left since pt has not returned any of the messages left previously by multiple staff members over the last 5 days.  I called pt's mother who was listed as additional contact. I stated that our office has been unable to contact Chau regarding a medical matter. She offered to have Kourtni call us back immediately. Moments later Tabatha called and I advised her of the following medical issues: her recent test for Trichomonas was positive. Pt stated that she has been treated several times for this infection, has not had intercourse and the test keeps coming back positive. I stated that we will treat her again and will discuss further at her visit today. I also informed pt that the lesion she had at her last visit tested positive for herpes. This will require a cesarean delivery. Pt stated that she has not had anything to eat or drink since 0700 today. I asked pt to remain NPO and come to our office for her appt today @ 1300 instead of 1440 and also stated that she will not need the ultrasound as scheduled @ 1245. She should be prepared for the possibility of cesarean delivery today. The delivery plan will be discussed in detail at her appt.  Pt voiced understanding.

## 2016-09-08 NOTE — Telephone Encounter (Signed)
Pt was contacted today regarding medical needs.  (see detailed notes in another encounter)  She has office appt today and will likely have C/S delivery today. Pt was asked to come early to her appt - arrive @ 1300. She voiced understanding.

## 2016-09-08 NOTE — Patient Instructions (Signed)
Cesarean Delivery, Care After  Refer to this sheet in the next few weeks. These instructions provide you with information on caring for yourself after your procedure. Your health care provider may also give you specific instructions. Your treatment has been planned according to current medical practices, but problems sometimes occur. Call your health care provider if you have any problems or questions after you go home.  HOME CARE INSTRUCTIONS   Only take over-the-counter or prescription medications as directed by your health care provider.   Do not drink alcohol, especially if you are breastfeeding or taking medication to relieve pain.   Do not chew or smoke tobacco.   Continue to use good perineal care. Good perineal care includes:    Wiping your perineum from front to back.    Keeping your perineum clean.   Check your surgical cut (incision) daily for increased redness, drainage, swelling, or separation of skin.   Clean your incision gently with soap and water every day, and then pat it dry. If your health care provider says it is okay, leave the incision uncovered. Use a bandage (dressing) if the incision is draining fluid or appears irritated. If the adhesive strips across the incision do not fall off within 7 days, carefully peel them off.   Hug a pillow when coughing or sneezing until your incision is healed. This helps to relieve pain.   Do not use tampons or douche until your health care provider says it is okay.   Shower, wash your hair, and take tub baths as directed by your health care provider.   Wear a well-fitting bra that provides breast support.   Limit wearing support panties or control-top hose.   Drink enough fluids to keep your urine clear or pale yellow.   Eat high-fiber foods such as whole grain cereals and breads, brown rice, beans, and fresh fruits and vegetables every day. These foods may help prevent or relieve constipation.   Resume activities such as climbing stairs,  driving, lifting, exercising, or traveling as directed by your health care provider.   Talk to your health care provider about resuming sexual activities. This is dependent upon your risk of infection, your rate of healing, and your comfort and desire to resume sexual activity.   Try to have someone help you with your household activities and your newborn for at least a few days after you leave the hospital.   Rest as much as possible. Try to rest or take a nap when your newborn is sleeping.   Increase your activities gradually.   Keep all of your scheduled postpartum appointments. It is very important to keep your scheduled follow-up appointments. At these appointments, your health care provider will be checking to make sure that you are healing physically and emotionally.  SEEK MEDICAL CARE IF:    You are passing large clots from your vagina. Save any clots to show your health care provider.   You have a foul smelling discharge from your vagina.   You have trouble urinating.   You are urinating frequently.   You have pain when you urinate.   You have a change in your bowel movements.   You have increasing redness, pain, or swelling near your incision.   You have pus draining from your incision.   Your incision is separating.   You have painful, hard, or reddened breasts.   You have a severe headache.   You have blurred vision or see spots.   You feel sad   or depressed.   You have thoughts of hurting yourself or your newborn.   You have questions about your care, the care of your newborn, or medications.   You are dizzy or light-headed.   You have a rash.   You have pain, redness, or swelling at the site of the removed intravenous access (IV) tube.   You have nausea or vomiting.   You stopped breastfeeding and have not had a menstrual period within 12 weeks of stopping.   You are not breastfeeding and have not had a menstrual period within 12 weeks of delivery.   You have a fever.  SEEK  IMMEDIATE MEDICAL CARE IF:   You have persistent pain.   You have chest pain.   You have shortness of breath.   You faint.   You have leg pain.   You have stomach pain.   Your vaginal bleeding saturates 2 or more sanitary pads in 1 hour.  MAKE SURE YOU:    Understand these instructions.   Will watch your condition.   Will get help right away if you are not doing well or get worse.     This information is not intended to replace advice given to you by your health care provider. Make sure you discuss any questions you have with your health care provider.     Document Released: 07/11/2002 Document Revised: 11/09/2014 Document Reviewed: 06/15/2012  Elsevier Interactive Patient Education 2016 [employer] Inc[employer]

## 2016-09-08 NOTE — Progress Notes (Signed)
   PRENATAL VISIT NOTE  Subjective:  Deanna Murray is a 25 y.o. [phone] at 5970w4d being seen today for ongoing prenatal care.  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  Patient reports occasional contractions.  Contractions: Irregular. Vag. Bleeding: None.  Movement: Present. Denies leaking of fluid.   The following portions of the patient's history were reviewed and updated as appropriate: allergies, current medications, past family history, past medical history, past social history, past surgical history and problem list. Problem list updated.  Objective:   Vitals:   09/08/16 1347  BP: 122/80  Pulse: 87  Weight: 193 lb 4.8 oz (87.7 kg)    Fetal Status: Fetal Heart Rate (bpm): 142   Movement: Present     General:  Alert, oriented and cooperative. Patient is in no acute distress.  Skin: Skin is warm and dry. No rash noted.   Cardiovascular: Normal heart rate noted  Respiratory: Normal respiratory effort, no problems with respiration noted  Abdomen: Soft, gravid, appropriate for gestational age. Pain/Pressure: Present     Pelvic:  Cervical exam deferred        Extremities: Normal range of motion.  Edema: None  Mental Status: Normal mood and affect. Normal behavior. Normal judgment and thought content.   Assessment and Plan:  Pregnancy: G4P3003 at 4370w4d  1. Supervision of high risk pregnancy in third trimester     Dr Shawnie PonsPratt will come to see her and discuss C/S   Aviva SignsMarie L Rian Busche, CNM

## 2016-09-09 ENCOUNTER — Encounter (HOSPITAL_COMMUNITY): Payer: Self-pay | Admitting: Anesthesiology

## 2016-09-09 LAB — RPR: RPR: NONREACTIVE

## 2016-09-09 LAB — CBC
HEMATOCRIT: 24.2 % — AB (ref 36.0–46.0)
Hemoglobin: 7.7 g/dL — ABNORMAL LOW (ref 12.0–15.0)
MCH: 24.6 pg — ABNORMAL LOW (ref 26.0–34.0)
MCHC: 31.8 g/dL (ref 30.0–36.0)
MCV: 77.3 fL — ABNORMAL LOW (ref 78.0–100.0)
Platelets: 205 10*3/uL (ref 150–400)
RBC: 3.13 MIL/uL — ABNORMAL LOW (ref 3.87–5.11)
RDW: 15 % (ref 11.5–15.5)
WBC: 8.8 10*3/uL (ref 4.0–10.5)

## 2016-09-09 MED ORDER — INFLUENZA VAC SPLIT QUAD 0.5 ML IM SUSY
0.5000 mL | PREFILLED_SYRINGE | INTRAMUSCULAR | Status: AC
  Administered 2016-09-10: 0.5 mL via INTRAMUSCULAR
  Filled 2016-09-09: qty 0.5

## 2016-09-09 MED ORDER — FERROUS SULFATE 325 (65 FE) MG PO TABS
325.0000 mg | ORAL_TABLET | Freq: Two times a day (BID) | ORAL | Status: DC
  Administered 2016-09-09 – 2016-09-11 (×6): 325 mg via ORAL
  Filled 2016-09-09 (×6): qty 1

## 2016-09-09 NOTE — Progress Notes (Signed)
Subjective: Postpartum Day 1: Cesarean Delivery d/t active HSV lesion Eating crackers w/o problems, drinking, ambulating well.  Foley just removed this am. Has walked to br and back w/o difficulty, hasn't voided yet. No flatus or bm yet.  Lochia and pain wnl.  Denies dizziness, lightheadedness, or sob. No complaints.   Objective: Vital signs in last 24 hours: Temp:  [97.3 F (36.3 C)-98.9 F (37.2 C)] 97.8 F (36.6 C) (11/08 0630) Pulse Rate:  [71-94] 76 (11/08 0630) Resp:  [15-35] 18 (11/08 0630) BP: (80-135)/(63-117) 127/76 (11/08 0630) SpO2:  [97 %-100 %] 98 % (11/08 0630) Weight:  [87.7 kg (193 lb 4.8 oz)] 87.7 kg (193 lb 4.8 oz) (11/07 1347)  Physical Exam:  General: alert, cooperative and no distress Lochia: appropriate Uterine Fundus: firm Incision: healing well, no significant drainage, big bulky dressing intact DVT Evaluation: No evidence of DVT seen on physical exam. Negative Homan's sign. No cords or calf tenderness. No significant calf/ankle edema.   Recent Labs  09/08/16 1515 09/09/16 0536  HGB 8.5* 7.7*  HCT 26.8* 24.2*    Assessment/Plan: Status post Cesarean section. Doing well postoperatively.  Continue current care. Ambulate today, progress to regular diet Breastfeeding, plans nexplanon  Deanna Murray, Deanna Murray 09/09/2016, 7:44 AM

## 2016-09-09 NOTE — Progress Notes (Signed)
UR chart review completed.  

## 2016-09-09 NOTE — Clinical Social Work Maternal (Signed)
CLINICAL SOCIAL WORK MATERNAL/CHILD NOTE  Patient Details  Name: Deanna Murray MRN: [id number] Date of Birth: 09/08/2018  Date:  09/09/2016  Clinical Social Worker Initiating Note:  Laurey Arrow Date/ Time Initiated:  09/09/16/1554     Child's Name:  Deanna Murray   Legal Guardian:  Mother   Need for Interpreter:  None   Date of Referral:  09/09/16     Reason for Referral:  Behavioral Health Issues, including SI , Current Substance Use/Substance Use During Pregnancy , Late or No Prenatal Care    Referral Source:  [facility]   Address:  [place] [address]  Phone number:  [phone]   Household Members:  Self, Minor Children   Natural Supports (not living in the home):  Immediate Family, Extended Family   Professional Supports: Other (Comment) (MOB receives outpatient treatment at [facility].)   Employment: Unemployed   Type of Work:     Education:  Database administrator Resources:  Medicaid   Other Resources:  [facility]   Cultural/Religious Considerations Which May Impact Care:  None Reported  Strengths:  Ability to meet basic needs , Home prepared for child , Understanding of illness   Risk Factors/Current Problems:  Substance Use , Mental Health Concerns    Cognitive State:  Alert , Able to Concentrate , Linear Thinking    Mood/Affect:  Flat , Calm , Comfortable , Interested    CSW Assessment: CSW met with MOB and completed an assessment for hx of bipolar, substance use, and limited prenatal care. MOB was open to meeting with CSW.  CSW inquired about MOB's limited prenatal care and MOB reported that MOB has a medically fragile 27-year-old child that appointments conflicted with MOB's prenatal care appointment.  CSW inquired MOB taking infant to well-baby visits and follow-up appointments after dc; MOB denied any barriers. CSW also inquired MOB's substance use in pregnancy, and MOB was honest about her  use.  MOB reported MOB took Xanax, and MOB reported that MOB did not have a prescription for them.  MOB communicated MOB's last use of Xanax was in July 2017.  CSW thanked MOB for her honesty, and shared with MOB of the hospital's drug screen policy, and informed MOB of the 2 screenings for the infant.  CSW reported to MOB that the infant's UDS was negative, and CSW will follow the infant's cord screen.  MOB was made aware, that if infant's cord screen is positive for any substance without an explanation, CSW would make a report to [place]. MOB did not have any questions or concerns at this time.  CSW offered MOB SA resources and MOB declined. CSW inquired about MOB's MH, and MOB acknowledged her hx of bipolar dx.  MOB stated that she is currently not on medication, but she does have a Social worker at [place]. At this time, MOB does not have a scheduled appointment, but plans to make an appointment after MOB is discharged.   CSW educated MOB about PPD. CSW informed MOB of possible supports and interventions to decrease PPD.  CSW also encouraged MOB to seek medical attention if needed for increased signs and symptoms for PPD. MOB declined any additional resources and referral from CSW at this time.  CSW thanked MOB for meeting with CSW, and MOB did not have any questions or concerns at this time.  CSW Plan/Description:  Child Copy Report , Information/Referral to [employer] , [employer] ,  No Further Intervention Required/No Barriers to Discharge (CSW will follow infant's cord and will make a report to [place] if needed. )   Laurey Arrow, MSW, LCSW Clinical Social Work [phone]    Dimple Nanas, LCSW 09/09/2016, 3:56 PM

## 2016-09-09 NOTE — Lactation Note (Signed)
This note was copied from a baby's chart. Lactation Consultation Note  Patient Name: Deanna Murray Eye ZOXWR'UToday's Date: 09/09/2016 Reason for consult: Initial assessment Breastfeeding consultation services and support information given and reviewed.  Mom chooses to both breast and formula feed.  Newborn is 121 hours old and mom states she is latching easily.  Instructed to feed with any hunger cue and to call for assist prn.  Maternal Data    Feeding    LATCH Score/Interventions                      Lactation Tools Discussed/Used     Consult Status Consult Status: Follow-up Date: 09/10/16 Follow-up type: In-patient    Huston FoleyMOULDEN, Isaly Fasching S 09/09/2016, 3:04 PM

## 2016-09-09 NOTE — Anesthesia Postprocedure Evaluation (Signed)
Anesthesia Post Note  Patient: Deanna Murray  Procedure(s) Performed: Procedure(s) (LRB): CESAREAN SECTION (N/A)  Patient location during evaluation: PACU Anesthesia Type: Spinal Level of consciousness: oriented and awake and alert Pain management: pain level controlled Vital Signs Assessment: post-procedure vital signs reviewed and stable Respiratory status: spontaneous breathing and respiratory function stable Cardiovascular status: blood pressure returned to baseline and stable Postop Assessment: no headache and no backache Anesthetic complications: no     Last Vitals:  Vitals:   09/09/16 0400 09/09/16 0630  BP:  127/76  Pulse:  76  Resp: 20 18  Temp: 36.9 C 36.6 C    Last Pain:  Vitals:   09/09/16 0630  TempSrc: Oral  PainSc: 0-No pain   Pain Goal: Patients Stated Pain Goal: 7 (09/08/16 1905)               Junious SilkGILBERT,[name]

## 2016-09-09 NOTE — Anesthesia Postprocedure Evaluation (Signed)
Anesthesia Post Note  Patient: Deanna Murray  Procedure(s) Performed: Procedure(s) (LRB): CESAREAN SECTION (N/A)  Patient location during evaluation: PACU Anesthesia Type: Spinal Level of consciousness: awake Pain management: pain level controlled Vital Signs Assessment: post-procedure vital signs reviewed and stable Respiratory status: spontaneous breathing Cardiovascular status: stable Postop Assessment: no headache, no backache, spinal receding, patient able to bend at knees and no signs of nausea or vomiting Anesthetic complications: no     Last Vitals:  Vitals:   09/09/16 0630 09/09/16 1015  BP: 127/76 129/76  Pulse: 76 68  Resp: 18 18  Temp: 36.6 C 36.8 C    Last Pain:  Vitals:   09/09/16 1015  TempSrc: Oral  PainSc: 5    Pain Goal: Patients Stated Pain Goal: 7 (09/08/16 1905)               Kyuss Hale JR,JOHN Susann GivensFRANKLIN

## 2016-09-09 NOTE — Lactation Note (Signed)
This note was copied from a baby's chart. Lactation Consultation Note Follow up visit at 27 hours of age.  MBU RN request LATCH assessment due to rising bilirubin levels (8.2 serum at 26hours).  Baby is awakening and showing feeding cues.  LC encouraged mom to undress baby for latching and mom did.  LC assisted with hand expression of a small drop of colostrum.  Mom attempted a modified laid back hold with no support of baby to latch.  Mom is recovering from c/s, so LC assisted with positioning in a modified cross cradle hold.  Baby independently latches well with wide gape and strong rhythmic sucking for about 5 minutes.  Baby became sleepy and LC instructed mom on waking techniques to keep baby active during feeding.  LC did not hear audible swallows, but baby had good jaw excursions and pauses indicating swallows.   Mom has bottle at bedside and reports baby wouldn't take it.  LC encouraged mom to offer only breast if baby continues to do well and explained importance of establishing a good milk supply.   Baby has had 4 voids with 5 stools in 1st day of life.  Paci in crib and discussed reasons to not use.     Mom to call for assist as needed.      Patient Name: Deanna Murray WUJWJ'XToday's Date: 09/09/2016 Reason for consult: Follow-up assessment   Maternal Data Has patient been taught Hand Expression?: Yes Does the patient have breastfeeding experience prior to this delivery?: Yes  Feeding Feeding Type: Breast Fed Length of feed:  (8 minutes observed)  LATCH Score/Interventions Latch: Grasps breast easily, tongue down, lips flanged, rhythmical sucking.  Audible Swallowing: A few with stimulation Intervention(s): Skin to skin;Hand expression;Alternate breast massage  Type of Nipple: Everted at rest and after stimulation  Comfort (Breast/Nipple): Soft / non-tender     Hold (Positioning): Assistance needed to correctly position infant at breast and maintain latch. Intervention(s):  Breastfeeding basics reviewed;Support Pillows;Position options;Skin to skin  LATCH Score: 8  Lactation Tools Discussed/Used     Consult Status Consult Status: Follow-up    Fahed Morten, Arvella MerlesJana Lynn 09/09/2016, 8:52 PM

## 2016-09-10 ENCOUNTER — Encounter (HOSPITAL_COMMUNITY): Payer: Self-pay | Admitting: Family Medicine

## 2016-09-10 NOTE — Progress Notes (Signed)
POSTPARTUM PROGRESS NOTE  Post Op Day 2 Subjective:  Deanna Murray is a 25 y.o. [id number] 2671w4d s/p pLTCS secondary to active HSV infection.  No acute events overnight.  Pt denies problems with ambulating, voiding or po intake.  She denies nausea or vomiting.  Pain is well controlled.  She has had flatus. She has had bowel movement.  Lochia Minimal.  Patient is complaining today of left sided upper back pain.  Patient describes the pain as sharp and worse with deep breathing.  She rates it as a 6.5/10 severity.  She denies chest pain, shortness of breath, leg swelling, and fever.    Objective: Blood pressure 129/81, pulse 91, temperature 97.5 F (36.4 C), temperature source Oral, resp. rate 18, height 5\' 4"  (1.626 m), weight 87.5 kg (193 lb), last menstrual period 01/21/2016, SpO2 98 %, unknown if currently breastfeeding.  Physical Exam:  General: alert, cooperative and no distress, sitting comfortably in bed Lochia:normal flow Chest: no respiratory distress, moderately tender in the left thoracic intercostal muscles around T5-T6.   Lungs:  CTAB, no wheezes or crackles.  Breathing comfortably Heart:regular rate, distal pulses intact Abdomen: soft, nontender, incision clean and intact Uterine Fundus: firm, appropriately tender DVT Evaluation: No calf swelling or tenderness Extremities: No edema   Recent Labs  09/08/16 1515 09/09/16 0536  HGB 8.5* 7.7*  HCT 26.8* 24.2*    Assessment/Plan:  ASSESSMENT: Deanna Niemannenika R Crisco is a 25 y.o. [id number] 2171w4d s/p pLTCS 2/2 active HSV infection.  Back pain likely related to MSK etiology as it is reproducible with palpation and without abnormalities in vital signs or lung exam.    - Breastfeeding - Continue with acyclovir TID - Continue with PO iron - Anticipate d/c tomorrow   LOS: 2 days   Jeannine Kittenathaniel Kaylie Ritter MS3 09/10/2016, 7:29 AM

## 2016-09-10 NOTE — Progress Notes (Signed)
Subjective: Postpartum Day 2: Cesarean Delivery Patient reports tolerating PO, + flatus and no problems voiding.    Objective: Vital signs in last 24 hours: Temp:  [97.5 F (36.4 C)-97.8 F (36.6 C)] 97.5 F (36.4 C) (11/09 16100637) Pulse Rate:  [84-91] 91 (11/09 0637) Resp:  [18-19] 18 (11/09 0637) BP: (123-129)/(69-81) 129/81 (11/09 [id number]) Weight:  [193 lb (87.5 kg)] 193 lb (87.5 kg) (11/08 1700)  Physical Exam:  General: alert, cooperative and no distress Lochia: appropriate Uterine Fundus: firm Incision: no significant drainage DVT Evaluation: No evidence of DVT seen on physical exam. Negative Homan's sign. No cords or calf tenderness. No significant calf/ankle edema.   Recent Labs  09/08/16 1515 09/09/16 0536  HGB 8.5* 7.7*  HCT 26.8* 24.2*    Assessment/Plan: Status post Cesarean section. Doing well postoperatively.  Continue current care. Plan for D/C tomorrow. Plans Nexplanon for contraception.  LEFTWICH-KIRBY, Michala Deblanc 09/10/2016, 10:21 AM

## 2016-09-10 NOTE — Lactation Note (Signed)
This note was copied from a baby's chart. Lactation Consultation Note Baby was very spitty the first day of life, had a large output and 8% weight loss. 5 voids, 6 stools latch of 8 at 33 hrs of life. LC to F/U today. Patient Name: Deanna Marveen Reeksenika Bumgarner WUJWJ'XToday's Date: 09/10/2016 Reason for consult: Follow-up assessment;Infant weight loss   Maternal Data    Feeding Feeding Type: Breast Fed  LATCH Score/Interventions                      Lactation Tools Discussed/Used     Consult Status Consult Status: Follow-up Date: 09/10/16 Follow-up type: In-patient    Charyl DancerCARVER, Tiegan Terpstra G 09/10/2016, 7:50 AM

## 2016-09-11 MED ORDER — DOCUSATE SODIUM 100 MG PO CAPS: 100.0000 mg | ORAL_CAPSULE | Freq: Two times a day (BID) | ORAL | 2 refills | Status: DC | PRN

## 2016-09-11 MED ORDER — FERROUS SULFATE 325 (65 FE) MG PO TABS: 325.0000 mg | ORAL_TABLET | Freq: Two times a day (BID) | ORAL | 1 refills | Status: DC

## 2016-09-11 MED ORDER — OXYCODONE HCL 10 MG PO TABS: 5.0000 mg | ORAL_TABLET | ORAL | 0 refills | Status: DC | PRN

## 2016-09-11 MED ORDER — VALACYCLOVIR HCL 500 MG PO TABS: 1000.0000 mg | ORAL_TABLET | Freq: Two times a day (BID) | ORAL | 5 refills | Status: DC

## 2016-09-11 MED ORDER — IBUPROFEN 600 MG PO TABS: 600.0000 mg | ORAL_TABLET | Freq: Four times a day (QID) | ORAL | 0 refills | Status: DC

## 2016-09-11 NOTE — Discharge Instructions (Signed)
Cesarean Delivery, Care After °Refer to this sheet in the next few weeks. These instructions provide you with information on caring for yourself after your procedure. Your health care provider may also give you specific instructions. Your treatment has been planned according to current medical practices, but problems sometimes occur. Call your health care provider if you have any problems or questions after you go home. °HOME CARE INSTRUCTIONS  °· Only take over-the-counter or prescription medications as directed by your health care provider. °· Do not drink alcohol, especially if you are breastfeeding or taking medication to relieve pain. °· Do not chew or smoke tobacco. °· Continue to use good perineal care. Good perineal care includes: °¨ Wiping your perineum from front to back. °¨ Keeping your perineum clean. °· Check your surgical cut (incision) daily for increased redness, drainage, swelling, or separation of skin. °· Clean your incision gently with soap and water every day, and then pat it dry. If your health care provider says it is okay, leave the incision uncovered. Use a bandage (dressing) if the incision is draining fluid or appears irritated. If the adhesive strips across the incision do not fall off within 7 days, carefully peel them off. °· Hug a pillow when coughing or sneezing until your incision is healed. This helps to relieve pain. °· Do not use tampons or douche until your health care provider says it is okay. °· Shower, wash your hair, and take tub baths as directed by your health care provider. °· Wear a well-fitting bra that provides breast support. °· Limit wearing support panties or control-top hose. °· Drink enough fluids to keep your urine clear or pale yellow. °· Eat high-fiber foods such as whole grain cereals and breads, brown rice, beans, and fresh fruits and vegetables every day. These foods may help prevent or relieve constipation. °· Resume activities such as climbing stairs,  driving, lifting, exercising, or traveling as directed by your health care provider. °· Talk to your health care provider about resuming sexual activities. This is dependent upon your risk of infection, your rate of healing, and your comfort and desire to resume sexual activity. °· Try to have someone help you with your household activities and your newborn for at least a few days after you leave the hospital. °· Rest as much as possible. Try to rest or take a nap when your newborn is sleeping. °· Increase your activities gradually. °· Keep all of your scheduled postpartum appointments. It is very important to keep your scheduled follow-up appointments. At these appointments, your health care provider will be checking to make sure that you are healing physically and emotionally. °SEEK MEDICAL CARE IF:  °· You are passing large clots from your vagina. Save any clots to show your health care provider. °· You have a foul smelling discharge from your vagina. °· You have trouble urinating. °· You are urinating frequently. °· You have pain when you urinate. °· You have a change in your bowel movements. °· You have increasing redness, pain, or swelling near your incision. °· You have pus draining from your incision. °· Your incision is separating. °· You have painful, hard, or reddened breasts. °· You have a severe headache. °· You have blurred vision or see spots. °· You feel sad or depressed. °· You have thoughts of hurting yourself or your newborn. °· You have questions about your care, the care of your newborn, or medications. °· You are dizzy or light-headed. °· You have a rash. °· You   have pain, redness, or swelling at the site of the removed intravenous access (IV) tube.  You have nausea or vomiting.  You stopped breastfeeding and have not had a menstrual period within 12 weeks of stopping.  You are not breastfeeding and have not had a menstrual period within 12 weeks of delivery.  You have a fever. SEEK  IMMEDIATE MEDICAL CARE IF:  You have persistent pain.  You have chest pain.  You have shortness of breath.  You faint.  You have leg pain.  You have stomach pain.  Your vaginal bleeding saturates 2 or more sanitary pads in 1 hour. MAKE SURE YOU:   Understand these instructions.  Will watch your condition.  Will get help right away if you are not doing well or get worse.   This information is not intended to replace advice given to you by your health care provider. Make sure you discuss any questions you have with your health care provider.   Document Released: 07/11/2002 Document Revised: 11/09/2014 Document Reviewed: 06/15/2012 Elsevier Interactive Patient Education 2016 [employer]   Breastfeeding Challenges and Solutions Even though breastfeeding is natural, it can be challenging, especially in the first few weeks after childbirth. It is normal for problems to arise when starting to breastfeed your new baby, even if you have breastfed before. This document provides some solutions to the most common breastfeeding challenges.  CHALLENGES AND SOLUTIONS Challenge--Cracked or Sore Nipples Cracked or sore nipples are commonly experienced by breastfeeding mothers. Cracked or sore nipples often are caused by inadequate latching (when your baby's mouth attaches to your breast to breastfeed). Soreness can also happen if your baby is not positioned properly at your breast. Although nipple cracking and soreness are common during the first week after birth, nipple pain is never normal. If you experience nipple cracking or soreness that lasts longer than 1 week or nipple pain, call your health care provider or lactation consultant.  Solution Ensure proper latching and positioning of your baby by following the steps below:  Find a comfortable place to sit or lie down, with your neck and back well supported.  Place a pillow or rolled up blanket under your baby to bring him or her to the  level of your breast (if you are seated).  Make sure that your baby's abdomen is facing your abdomen.  Gently massage your breast. With your fingertips, massage from your chest wall toward your nipple in a circular motion. This encourages milk flow. You may need to continue this action during the feeding if your milk flows slowly.  Support your breast with 4 fingers underneath and your thumb above your nipple. Make sure your fingers are well away from your nipple and your baby's mouth.  Stroke your baby's lips gently with your finger or nipple.  When your baby's mouth is open wide enough, quickly bring your baby to your breast, placing your entire nipple and as much of the colored area around your nipple (areola) as possible into your baby's mouth.  More areola should be visible above your baby's upper lip than below the lower lip.  Your baby's tongue should be between his or her lower gum and your breast.  Ensure that your baby's mouth is correctly positioned around your nipple (latched). Your baby's lips should create a seal on your breast and be turned out (everted).  It is common for your baby to suck for about 2-3 minutes in order to start the flow of breast milk. Signs that  your baby has successfully latched on to your nipple include:   Quietly tugging or quietly sucking without causing you pain.   Swallowing heard between every 3-4 sucks.   Muscle movement above and in front of his or her ears with sucking.  Signs that your baby has not successfully latched on to nipple include:   Sucking sounds or smacking sounds from your baby while nursing.   Nipple pain.  Ensure that your breasts stay moisturized and healthy by:  Avoiding the use of soap on your nipples.   Wearing a supportive bra. Avoid wearing underwire-style bras or tight bras.   Air drying your nipples for 3-4 minutes after each feeding.   Using only cotton bra pads to absorb breast milk leakage. Leaking  of breast milk between feedings is normal. Be sure to change the pads if they become soaked with milk.  Using lanolin on your nipples after nursing. Lanolin helps to maintain your skin's normal moisture barrier. If you use pure lanolin you do not need to wash it off before feeding your baby again. Pure lanolin is not toxic to your baby. You may also hand express a few drops of breast milk and gently massage that milk into your nipples, allowing it to air dry. Challenge--Breast Engorgement Breast engorgement is the overfilling of your breasts with breast milk. In the first few weeks after giving birth, you may experience breast engorgement. Breast engorgement can make your breasts throb and feel hard, tightly stretched, warm, and tender. Engorgement peaks about the fifth day after you give birth. Having breast engorgement does not mean you have to stop breastfeeding your baby. Solution  Breastfeed when you feel the need to reduce the fullness of your breasts or when your baby shows signs of hunger. This is called "breastfeeding on demand."  Newborns (babies younger than 4 weeks) often breastfeed every 1-3 hours during the day. You may need to awaken your baby to feed if he or she is asleep at a feeding time.  Do not allow your baby to sleep longer than 5 hours during the night without a feeding.  Pump or hand express breast milk before breastfeeding to soften your breast, areola, and nipple.  Apply warm, moist heat (in the shower or with warm water-soaked hand towels) just before feeding or pumping, or massage your breast before or during breastfeeding. This increases circulation and helps your milk to flow.  Completely empty your breasts when breastfeeding or pumping. Afterward, wear a snug bra (nursing or regular) or tank top for 1-2 days to signal your body to slightly decrease milk production. Only wear snug bras or tank tops to treat engorgement. Tight bras typically should be avoided by  breastfeeding mothers. Once engorgement is relieved, return to wearing regular, loose-fitting clothes.  Apply ice packs to your breasts to lessen the pain from engorgement and relieve swelling, unless the ice is uncomfortable for you.  Do not delay feedings. Try to relax when it is time to feed your baby. This helps to trigger your "let-down reflex," which releases milk from your breast.  Ensure your baby is latched on to your breast and positioned properly while breastfeeding.  Allow your baby to remain at your breast as long as he or she is latched on well and actively sucking. Your baby will let you know when he or she is done breastfeeding by pulling away from your breast or falling asleep.  Avoid introducing bottles or pacifiers to your baby in the early weeks  of breastfeeding. Wait to introduce these things until after resolving any breastfeeding challenges.  Try to pump your milk on the same schedule as when your baby would breastfeed if you are returning to work or away from home for an extended period.  Drink plenty of fluids to avoid dehydration, which can eventually put you at greater risk of breast engorgement. If you follow these suggestions, your engorgement should improve in 24-48 hours. If you are still experiencing difficulty, call your lactation consultant or health care provider.  Challenge--Plugged Milk Ducts Plugged milk ducts occur when the duct does not drain milk effectively and becomes swollen. Wearing a tight-fitting nursing bra or having difficulty with latching may cause plugged milk ducts. Not drinking enough water (8-10 c [1.9-2.4 L] per day) can contribute to plugged milk ducts. Once a duct has become plugged, hard lumps, soreness, and redness may develop in your breast.  Solution Do not delay feedings. Feed your baby frequently and try to empty your breasts of milk at each feeding. Try breastfeeding from the affected side first so there is a better chance that the  milk will drain completely from that breast. Apply warm, moist towels to your breasts for 5-10 minutes before feeding. Alternatively, a hot shower right before breastfeeding can provide the moist heat that can encourage milk flow. Gentle massage of the sore area before and during a feeding may also help. Avoid wearing tight clothing or bras that put pressure on your breasts. Wear bras that offer good support to your breasts, but avoid underwire bras. If you have a plugged milk duct and develop a fever, you need to see your health care provider.  Challenge--Mastitis Mastitis is inflammation of your breast. It usually is caused by a bacterial infection and can cause flu-like symptoms. You may develop redness in your breast and a fever. Often when mastitis occurs, your breast becomes firm, warm, and very painful. The most common causes of mastitis are poor latching, ineffective sucking from your baby, consistent pressure on your breast (possibly from wearing a tight-fitting bra or shirt that restricts the milk flow), unusual stress or fatigue, or missed feedings.  Solution You will be given antibiotic medicine to treat the infection. It is still important to breastfeed frequently to empty your breasts. Continuing to breastfeed while you recover from mastitis will not harm your baby. Make sure your baby is positioned properly during every feeding. Apply moist heat to your breasts for a few minutes before feeding to help the milk flow and to help your breasts empty more easily. Challenge--Thrush Ginette Pitmanhrush is a yeast infection that can form on your nipples, in your breast, or in your baby's mouth. It causes itching, soreness, burning or stabbing pain, and sometimes a rash.  Solution You will be given a medicated ointment for your nipples, and your baby will be given a liquid medicine for his or her mouth. It is important that you and your baby are treated at the same time because thrush can be passed between you and  your baby. Change disposable nursing pads often. Any bras, towels, or clothing that come in contact with infected areas of your body or your baby's body need to be washed in very hot water every day. Wash your hands and your baby's hands often. All pacifiers, bottle nipples, or toys your baby puts in his or her mouth should be boiled once a day for 20 minutes. After 1 week of treatment, discard pacifiers and bottle nipples and buy new ones.  All breast pump parts that touch the milk need to be boiled for 20 minutes every day. Challenge--Low Milk Supply You may not be producing enough milk if your baby is not gaining the proper amount of weight. Breast milk production is based on a supply-and-demand system. Your milk supply depends on how frequently and effectively your baby empties your breast. Solution The more you breastfeed and pump, the more breast milk you will produce. It is important that your baby empties at least one of your breasts at each feeding. If this is not happening, then use a breast pump or hand express any milk that remains. This will help to drain as much milk as possible at each feeding. It will also signal your body to produce more milk. If your baby is not emptying your breasts, it may be due to latching, sucking, or positioning problems. If low milk supply continues after addressing these issues, contact your health care provider or a lactation specialist as soon as possible. Challenge--Inverted or Flat Nipples Some women have nipples that turn inward instead of protruding outward. Other women have nipples that are flat. Inverted or flat nipples can sometimes make it more difficult for your baby to latch onto your breast. Solution You may be given a small device that pulls out inverted nipples. This device should be applied right before your baby is brought to your breast. You can also try using a breast pump for a short time before placing the baby at your breast. The pump can pull  your nipple outwards to help your infant latch more easily. The baby's sucking motion will help the inverted nipple protrude as well.  If you have flat nipples, encourage your baby to latch onto your breast and feed frequently in the early days after birth. This will give your baby practice latching on correctly while your breast is still soft. When your milk supply increases, between the second and fifth day after birth and your breasts become full, your baby will have an easier time latching.  Contact a lactation consultant if you still have concerns. She or he can teach you additional techniques to address breastfeeding problems related to nipple shape and position.  FOR MORE INFORMATION [employer]: www.llli.org   This information is not intended to replace advice given to you by your health care provider. Make sure you discuss any questions you have with your health care provider.   Document Released: 04/12/2006 Document Revised: 11/09/2014 Document Reviewed: 04/14/2013 Elsevier Interactive Patient Education [employer]

## 2016-09-11 NOTE — Lactation Note (Signed)
This note was copied from a baby's chart. Lactation Consultation Note Baby had 11% weight loss. BF well for long periods, had 9 stools, 9 voids and several emesis. Mom is giving baby a pacifier. Mom stated she plans on breast/formula when she gets home, and she likes pacifiers. LC feels that weight loss is d/t output since input is good. Mom states she is good to go home and [facility]  Patient Name: Deanna Murray ZOXWR'UToday's Date: 09/11/2016 Reason for consult: Follow-up assessment;Infant weight loss   Maternal Data    Feeding Feeding Type: Breast Fed Length of feed: 25 min  LATCH Score/Interventions Latch: Repeated attempts needed to sustain latch, nipple held in mouth throughout feeding, stimulation needed to elicit sucking reflex. Intervention(s): Assist with latch  Audible Swallowing: A few with stimulation  Type of Nipple: Everted at rest and after stimulation  Comfort (Breast/Nipple): Soft / non-tender     Hold (Positioning): Assistance needed to correctly position infant at breast and maintain latch.  LATCH Score: 7  Lactation Tools Discussed/Used     Consult Status Consult Status: Complete Date: 09/11/16    Charyl DancerCARVER, [name] 09/11/2016, 7:49 AM

## 2016-09-11 NOTE — Discharge Summary (Signed)
OB Discharge Summary     Patient Name: Deanna Deanna Murray Deanna Murray DOB: 1991/10/23 MRN: [id number]  Date of admission: 09/08/2016 Delivering MD: Reva BoresPRATT, TANYA S   Date of discharge: 09/11/2016  Admitting diagnosis: HSV OUTBREAK Intrauterine pregnancy: 3122w4d     Secondary diagnosis:  Principal Problem:   HSV-1 infection Active Problems:   Trichomoniasis   Genital HSV  Additional problems: None     Discharge diagnosis: Term Pregnancy Delivered , anemia                                                                                               Post partum procedures:None  Augmentation: NA  Complications: None  Hospital course:  Sceduled C/S   25 y.o. yo [id number] at 6722w4d was admitted to the hospital 09/08/2016 for scheduled cesarean section with the following indication:Active HSV.  Membrane Rupture Time/Date: 5:04 PM ,09/08/2016   Patient delivered a Viable infant.09/08/2016  Details of operation can be found in separate operative note.  Pateint had an uncomplicated postpartum course.  She is ambulating, tolerating a regular diet, passing flatus, and urinating well. Patient is discharged home in stable condition on  09/11/16          Physical exam Vitals:   09/09/16 1832 09/10/16 0637 09/10/16 1753 09/11/16 0603  BP: 123/69 129/81 129/74 135/79  Pulse: 84 91 97 92  Resp: 19 18 18 18   Temp: 97.8 F (36.6 C) 97.5 F (36.4 C) 98.7 F (37.1 C) 98.4 F (36.9 C)  TempSrc: Oral Oral Oral Oral  SpO2:      Weight:      Height:       General: alert, cooperative and no distress Lochia: appropriate Uterine Fundus: firm Incision: Healing well with no significant drainage, scant old blood on dressing DVT Evaluation: No evidence of DVT seen on physical exam. Labs: Lab Results  Component Value Date   WBC 8.8 09/09/2016   HGB 7.7 (L) 09/09/2016   HCT 24.2 (L) 09/09/2016   MCV 77.3 (L) 09/09/2016   PLT 205 09/09/2016   CMP Latest Ref Rng & Units 08/14/2016  Glucose 65 - 99 mg/dL 87   BUN 6 - 20 mg/dL 5(L)  Creatinine 1.470.44 - 1.00 mg/dL 8.290.55  Sodium 562135 - 130145 mmol/L 137  Potassium 3.5 - 5.1 mmol/L 3.4(L)  Chloride 101 - 111 mmol/L 108  CO2 22 - 32 mmol/L 23  Calcium 8.9 - 10.3 mg/dL 8.6(V8.8(L)  Total Protein 6.5 - 8.1 g/dL 6.6  Total Bilirubin 0.3 - 1.2 mg/dL 0.3  Alkaline Phos 38 - 126 U/L 61  AST 15 - 41 U/L 15  ALT 14 - 54 U/L 8(L)    Discharge instruction: per After Visit Summary and "Baby and Me Booklet".  After visit meds:    Medication List    STOP taking these medications   aspirin EC 81 MG tablet     TAKE these medications   docusate sodium 100 MG capsule Commonly known as:  COLACE Take 1 capsule (100 mg total) by mouth 2 (two) times daily as needed.   ferrous sulfate 325 (65 FE) MG  tablet Take 1 tablet (325 mg total) by mouth 2 (two) times daily with a meal.   ibuprofen 600 MG tablet Commonly known as:  ADVIL,MOTRIN Take 1 tablet (600 mg total) by mouth every 6 (six) hours.   Oxycodone HCl 10 MG Tabs Take 0.5-1 tablets (5-10 mg total) by mouth every 4 (four) hours as needed (pain scale > 7).   Prenatal Vitamin 27-0.8 MG Tabs Take 1 tablet by mouth daily.   valACYclovir 500 MG tablet Commonly known as:  VALTREX Take 2 tablets (1,000 mg total) by mouth 2 (two) times daily. X 3 days as needed for herpes outbreaks      [facility].   Diet: iron-rich diet  Activity: Advance as tolerated. Pelvic rest for 6 weeks.   Outpatient follow up:6 weeks Follow up Appt:Future Appointments Date Time Provider Department Center  10/14/2016 10:20 AM Marlis EdelsonWalidah N Karim, CNM [facility]   Follow up Visit: Follow-up Information    [facility]   Specialty:  Obstetrics and Gynecology Why:  for postpartum visit or sooner as needed Contact information: [address] [place] [phone]       [facility] Follow up.   Why:  as needed in emergencies Contact information: [address] [id number] [place] [id number] [phone]           Postpartum contraception: Nexplanon  Newborn Data: Live born female  Birth Weight: 6 lb 13 oz (3090 g) APGAR: 8, 9  Baby Feeding: Breast Disposition:home with mother   09/11/2016 Dorathy KinsmanVirginia Regina Coppolino, CNM

## 2016-10-14 ENCOUNTER — Ambulatory Visit: Payer: Self-pay | Admitting: Advanced Practice Midwife

## 2016-11-03 ENCOUNTER — Ambulatory Visit: Payer: Self-pay | Admitting: Advanced Practice Midwife

## 2016-11-03 ENCOUNTER — Ambulatory Visit (INDEPENDENT_AMBULATORY_CARE_PROVIDER_SITE_OTHER): Payer: Self-pay | Admitting: Clinical

## 2016-11-03 ENCOUNTER — Encounter: Payer: Self-pay | Admitting: Advanced Practice Midwife

## 2016-11-03 ENCOUNTER — Ambulatory Visit (INDEPENDENT_AMBULATORY_CARE_PROVIDER_SITE_OTHER): Payer: Medicaid Other | Admitting: Advanced Practice Midwife

## 2016-11-03 DIAGNOSIS — Z3049 Encounter for surveillance of other contraceptives: Secondary | ICD-10-CM | POA: Diagnosis not present

## 2016-11-03 DIAGNOSIS — R87611 Atypical squamous cells cannot exclude high grade squamous intraepithelial lesion on cytologic smear of cervix (ASC-H): Secondary | ICD-10-CM

## 2016-11-03 DIAGNOSIS — F4321 Adjustment disorder with depressed mood: Secondary | ICD-10-CM

## 2016-11-03 LAB — POCT PREGNANCY, URINE: PREG TEST UR: NEGATIVE

## 2016-11-03 MED ORDER — ETONOGESTREL 68 MG ~~LOC~~ IMPL
68.0000 mg | DRUG_IMPLANT | Freq: Once | SUBCUTANEOUS | Status: AC
  Administered 2016-11-03: 68 mg via SUBCUTANEOUS

## 2016-11-03 NOTE — BH Specialist Note (Signed)
Session Start time: 12:55   End Time: 1:15 Total Time:  20 minutes Type of Service: Behavioral Health - Individual/Family Interpreter: No.   Interpreter Name & Language: n/a # [facility]  SUBJECTIVE: Deanna Murray is a 26 y.o. female  Pt. was referred by f/u for:  depression. Pt. reports the following symptoms/concerns: Pt states that her primary concern today is worry over newborn health concerns, along with balancing caring for 4 children; pt says that relaxation breathing helps when she remembers to practice.  Duration of problem:  Recent increase in over one month Severity: mild Previous treatment: none  OBJECTIVE: Mood: Appropriate & Affect: Appropriate Risk of harm to self or others: No known risk of harm to self or others Assessments administered: PHQ9: 11/ GAD7: 2  LIFE CONTEXT:  Family & Social: Lives with four children School/ Work: Undetermined Self-Care: Some sleep issues (balancing active 26yo and newborn sleep schedules) Life changes: Recent childbirth What is important to pt/family (values): Overall wellbeing  GOALS ADDRESSED:  -Reduce symptoms of depression  INTERVENTIONS: Motivational Interviewing   ASSESSMENT:  Pt currently experiencing Adjustment disorder with depressed mood.  Pt may benefit from brief therapeutic intervention regarding coping with symptoms of depression  PLAN: 1. F/U with behavioral health clinician: As needed 2. Behavioral Health meds: none 3. Behavioral recommendations:  -Read educational material regarding coping with symptoms of depression -Consider practicing CALM relaxation breathing exercises on a daily basis -Consider using calming sound app at "rest/quiet time" daily with 26yo 4. Referral: Brief Counseling/Psychotherapy and Psychoeducation 5. From scale of 1-10, how likely are you to follow plan: 7  Rae LipsJamie C Rayven Hendrickson LCSWA Behavioral Health Clinician  Warmhandoff: no  Depression screen [facility] 2/9  11/03/2016 09/08/2016 09/01/2016 07/02/2016 04/27/2016  Decreased Interest 1 1 2 1  0  Down, Depressed, Hopeless 2 0 0 2 0  PHQ - 2 Score 3 1 2 3  0  Altered sleeping 2 1 3 2 2   Tired, decreased energy 2 1 2 2 2   Change in appetite 1 0 2 1 0  Feeling bad or failure about yourself  1 0 0 0 0  Trouble concentrating 1 0 0 0 0  Moving slowly or fidgety/restless 1 - 0 1 0  Suicidal thoughts 0 0 0 0 0  PHQ-9 Score 11 3 9 9 4    GAD 7 : Generalized Anxiety Score 11/03/2016 09/08/2016 07/02/2016 04/27/2016  Nervous, Anxious, on Edge 1 0 0 0  Control/stop worrying 0 0 0 1  Worry too much - different things 0 0 0 1  Trouble relaxing 1 1 1 1   Restless 0 0 0 1  Easily annoyed or irritable 0 0 0 1  Afraid - awful might happen 0 0 0 0  Total GAD 7 Score 2 1 1  5

## 2016-11-03 NOTE — Progress Notes (Signed)
Subjective:    Deanna Murray is a 26 y.o. female who presents for a postpartum visit. She is 8 weeks postpartum following a low cervical transverse Cesarean section. I have fully reviewed the prenatal and intrapartum course. The delivery was at 39 gestational weeks. Outcome: primary cesarean section, low transverse incision. Anesthesia: spinal. Postpartum course has been normal. Baby's course has been normal except an abdominal mass visible on fetal ultrasound is being followed with CT scan tomorrow. Baby is feeding by bottle - Similac Advance. Bleeding no bleeding. Bowel function is normal. Bladder function is normal. Patient is not sexually active. Contraception method is none. Pt to see behavioral health specialist related to depression score/worry over her baby's health.    The following portions of the patient's history were reviewed and updated as appropriate: allergies, current medications, past family history, past medical history, past social history, past surgical history and problem list.  Review of Systems A comprehensive review of systems was negative.   Objective:    BP 140/90   Pulse 82   Wt 161 lb (73 kg)   BMI 27.64 kg/m    VS reviewed, nursing note reviewed,  Constitutional: well developed, well nourished, no distress HEENT: normocephalic CV: normal rate Pulm/chest wall: normal effort Abdomen: soft Incision well approximated with no edema, erythema, or exudate Neuro: alert and oriented x 3 Skin: warm, dry Psych: affect normal  Pelvic exam deferred   Nexplanon Insertion Procedure Patient identified, informed consent performed, consent signed.   Patient does understand that irregular bleeding is a very common side effect of this medication. She was advised to have backup contraception for one week after placement. Pregnancy test in clinic today was negative.  Appropriate time out taken.  Patient's left arm was prepped and draped in the usual sterile fashion.. The  ruler used to measure and mark insertion area.  Patient was prepped with alcohol swab and then injected with 3 ml of 1% lidocaine.  She was prepped with betadine, Nexplanon removed from packaging,  Device confirmed in needle, then inserted full length of needle and withdrawn per handbook instructions. Nexplanon was able to palpated in the patient's arm; patient palpated the insert herself. There was minimal blood loss.  Patient insertion site covered with guaze and a pressure bandage to reduce any bruising.  The patient tolerated the procedure well and was given post procedure instructions.   Assessment:     Normal postpartum exam.  Nexplanon in place   Plan:    1. Contraception: Nexplanon 2. Follow up in: 1 year or as needed.

## 2016-12-16 ENCOUNTER — Ambulatory Visit: Payer: Self-pay | Admitting: Advanced Practice Midwife

## 2016-12-16 ENCOUNTER — Encounter: Payer: Self-pay | Admitting: *Deleted

## 2016-12-16 NOTE — Progress Notes (Signed)
Deanna Murray did not keep her schedule appointment for papsmear. No need to call patient , may reschedule if she calls.

## 2017-04-21 ENCOUNTER — Encounter: Payer: Self-pay | Admitting: Lab

## 2017-04-21 ENCOUNTER — Encounter: Payer: Self-pay | Admitting: Advanced Practice Midwife

## 2017-04-21 NOTE — Progress Notes (Signed)
Patient missed her GYN appointment today. Told  Dr.Leftwich and she said the patient would have to reschedule.

## 2017-05-11 ENCOUNTER — Encounter (HOSPITAL_COMMUNITY): Payer: Self-pay

## 2017-05-11 ENCOUNTER — Inpatient Hospital Stay (HOSPITAL_COMMUNITY)
Admission: AD | Admit: 2017-05-11 | Discharge: 2017-05-11 | Disposition: A | Discharge status: Home / Self Care | Payer: Medicaid Other | Source: Ambulatory Visit | Attending: Family Medicine | Admitting: Family Medicine

## 2017-05-11 DIAGNOSIS — Z87891 Personal history of nicotine dependence: Secondary | ICD-10-CM | POA: Insufficient documentation

## 2017-05-11 DIAGNOSIS — N3090 Cystitis, unspecified without hematuria: Secondary | ICD-10-CM

## 2017-05-11 DIAGNOSIS — R3915 Urgency of urination: Secondary | ICD-10-CM | POA: Diagnosis not present

## 2017-05-11 DIAGNOSIS — R350 Frequency of micturition: Secondary | ICD-10-CM | POA: Insufficient documentation

## 2017-05-11 DIAGNOSIS — R109 Unspecified abdominal pain: Secondary | ICD-10-CM | POA: Diagnosis not present

## 2017-05-11 LAB — URINALYSIS, ROUTINE W REFLEX MICROSCOPIC
Bilirubin Urine: NEGATIVE
GLUCOSE, UA: NEGATIVE mg/dL
HGB URINE DIPSTICK: NEGATIVE
Ketones, ur: NEGATIVE mg/dL
NITRITE: NEGATIVE
PROTEIN: NEGATIVE mg/dL
SPECIFIC GRAVITY, URINE: 1.026 (ref 1.005–1.030)
pH: 7 (ref 5.0–8.0)

## 2017-05-11 LAB — POCT PREGNANCY, URINE: PREG TEST UR: NEGATIVE

## 2017-05-11 MED ORDER — NITROFURANTOIN MONOHYD MACRO 100 MG PO CAPS: 100.0000 mg | ORAL_CAPSULE | Freq: Two times a day (BID) | ORAL | 1 refills | Status: AC

## 2017-05-11 NOTE — Progress Notes (Addendum)
G4P4 unsure of pregnancy currently. Had an infant 9 months ago. Last period was in February 28. Wheel out to 18.[redacted] wksga. Denies bleeding. Pain level 8/10 lower abdomin and back. VSS see. See flow sheet for details.   Doppler attempted. Unable to obtain FHT  1945: provider at [facility]. GC done. Pelvic exam done  1955: Discharge instructions given with pt understanding. Pt left unit via ambulatory

## 2017-05-11 NOTE — Discharge Instructions (Signed)
Acute Urinary Retention, Female Urinary retention means you are unable to pee completely or at all (empty your bladder). Follow these instructions at home:  Drink enough fluids to keep your pee (urine) clear or pale yellow.  If you are sent home with a tube that drains the bladder (catheter), there will be a drainage bag attached to it. There are two types of bags. One is big that you can wear at night without having to empty it. One is smaller and needs to be emptied more often.  Keep the drainage bag emptied.  Keep the drainage bag lower than the tube.  Only take medicine as told by your doctor. Contact a doctor if:  You have a low-grade fever.  You have spasms or you are leaking pee when you have spasms. Get help right away if:  You have chills or a fever.  Your catheter stops draining pee.  Your catheter falls out.  You have increased bleeding that does not stop after you have rested and increased the amount of fluids you had been drinking. This information is not intended to replace advice given to you by your health care provider. Make sure you discuss any questions you have with your health care provider. Document Released: 04/06/2008 Document Revised: 03/26/2016 Document Reviewed: 03/30/2013 Elsevier Interactive Patient Education  2017 Elsevier Inc.  

## 2017-05-11 NOTE — MAU Provider Note (Addendum)
MAU HISTORY AND PHYSICAL  Chief Complaint:  Abdominal Pain and Back Pain   Deanna Murray is a 26 y.o.  [id number]  at Unknown presenting for abdominal pain.  Mild but worse today. Suprapubic. No dysuria but increased frequency and some urgency. No back pain. No fever. No vomiting or diarrhea or nausea. Has an appetite. Hasn't taken meds. Hx one c/s in November. Not constipated. No vaginal bleeding. One sexual partner. Distant history of one std. Has had unprotected sex in past 2 weeks. ment.    Past Medical History:  Diagnosis Date  . Anemia   . Anxiety   . Bipolar 1 disorder (HCC)   . Gonorrhea   . Headache    tylenol prn  . HSV (herpes simplex virus) infection   . postpartum depression 2007  . Preeclampsia 2007   2007 with first pregnancy only -no problems since  . Pregnancy    7 months  . Seizure [facility](HCC)    last one at age 26 yrs old - none since  . SVD (spontaneous vaginal delivery)    x 3    Past Surgical History:  Procedure Laterality Date  . CESAREAN SECTION N/A 09/08/2016   Procedure: CESAREAN SECTION;  Surgeon: Deanna Boresanya S Pratt, MD;  [facility];  Service: Obstetrics;  Laterality: N/A;  . NO PAST SURGERIES      Family History  Problem Relation Age of Onset  . Seizures Father   . Alcohol abuse Neg Hx   . Arthritis Neg Hx   . Asthma Neg Hx   . Birth defects Neg Hx   . Cancer Neg Hx   . COPD Neg Hx   . Depression Neg Hx   . Diabetes Neg Hx   . Drug abuse Neg Hx   . Early death Neg Hx   . Hearing loss Neg Hx   . Heart disease Neg Hx   . Hyperlipidemia Neg Hx   . Hypertension Neg Hx   . Kidney disease Neg Hx   . Learning disabilities Neg Hx   . Mental illness Neg Hx   . Mental retardation Neg Hx   . Miscarriages / Stillbirths Neg Hx   . Stroke Neg Hx   . Vision loss Neg Hx     Social History  Substance Use Topics  . Smoking status: Former Smoker    Packs/day: 0.50    Years: 12.00    Types: Cigarettes    Quit date: 04/02/2016  .  Smokeless tobacco: Never Used  . Alcohol use 9.6 oz/week    16 Shots of liquor per week     Comment: None since 04/2016    No Known Allergies  Prescriptions Prior to Admission  Medication Sig Dispense Refill Last Dose  . docusate sodium (COLACE) 100 MG capsule Take 1 capsule (100 mg total) by mouth 2 (two) times daily as needed. (Patient not taking: Reported on 11/03/2016) 30 capsule 2 Not Taking  . ferrous sulfate 325 (65 FE) MG tablet Take 1 tablet (325 mg total) by mouth 2 (two) times daily with a meal. (Patient not taking: Reported on 11/03/2016) 60 tablet 1 Not Taking  . ibuprofen (ADVIL,MOTRIN) 600 MG tablet Take 1 tablet (600 mg total) by mouth every 6 (six) hours. (Patient not taking: Reported on 11/03/2016) 30 tablet 0 Not Taking  . oxyCODONE 10 MG TABS Take 0.5-1 tablets (5-10 mg total) by mouth every 4 (four) hours as needed (pain scale > 7). (Patient not taking: Reported on 11/03/2016)  30 tablet 0 Not Taking  . Prenatal Vit-Fe Fumarate-FA (PRENATAL VITAMIN) 27-0.8 MG TABS Take 1 tablet by mouth daily. (Patient not taking: Reported on 11/03/2016) 30 tablet 0 Not Taking  . valACYclovir (VALTREX) 500 MG tablet Take 2 tablets (1,000 mg total) by mouth 2 (two) times daily. X 3 days as needed for herpes outbreaks (Patient not taking: Reported on 11/03/2016) 30 tablet 5 Not Taking    Review of Systems - Negative except for what is mentioned in HPI.  Physical Exam  Blood pressure 116/73, pulse 83, temperature 98.3 F (36.8 C), temperature source Oral, resp. rate 18, weight 158 lb 12 oz (72 kg), SpO2 100 %, unknown if currently breastfeeding. GENERAL: Well-developed, well-nourished female in no acute distress.  LUNGS: Clear to auscultation bilaterally.  HEART: Regular rate and rhythm. ABDOMEN: Soft, suprapubic mild tenderness, nondistended,no rebound or guarding, no cva tenderess EXTREMITIES: Nontender, no edema, 2+ distal pulses. GU: no cmt or adnexal tenderness/fullness   Labs: Results for  orders placed or performed during the hospital encounter of 05/11/17 (from the past 24 hour(s))  Urinalysis, Routine w reflex microscopic   Collection Time: 05/11/17  7:04 PM  Result Value Ref Range   Color, Urine YELLOW YELLOW   APPearance HAZY (A) CLEAR   Specific Gravity, Urine 1.026 1.005 - 1.030   pH 7.0 5.0 - 8.0   Glucose, UA NEGATIVE NEGATIVE mg/dL   Hgb urine dipstick NEGATIVE NEGATIVE   Bilirubin Urine NEGATIVE NEGATIVE   Ketones, ur NEGATIVE NEGATIVE mg/dL   Protein, ur NEGATIVE NEGATIVE mg/dL   Nitrite NEGATIVE NEGATIVE   Leukocytes, UA LARGE (A) NEGATIVE   RBC / HPF 0-5 0 - 5 RBC/hpf   WBC, UA TOO NUMEROUS TO COUNT 0 - 5 WBC/hpf   Bacteria, UA FEW (A) NONE SEEN   Squamous Epithelial / LPF 6-30 (A) NONE SEEN   Mucous PRESENT   Pregnancy, urine POC   Collection Time: 05/11/17  7:13 PM  Result Value Ref Range   Preg Test, Ur NEGATIVE NEGATIVE    Imaging Studies:  No results found.  Assessment/plan: Deanna Murray is  26 y.o. [phone] at Unknown presents with mild suprapubic pain. Urinalysis suggestive of possible uti and does have some uti symtpoms (uregency and frequency), so will culture and treat. No cmt; did collect g/c. Otherwise well apeparing, benign vitals and abdominal exam. Upreg negative. No adnexal symptoms to suggest tubal/ovarian involvement. Abdominal pain return precautions discussed.   Deanna Murray 7/10/20187:48 PM

## 2017-05-11 NOTE — MAU Note (Signed)
Real sharp pains in the bottom of her stomach and lower back.  Started 2 days ago. Has no idea when LMP was, thinks maybe February, has not done a test.  Denies any GI or GU problems

## 2017-05-13 ENCOUNTER — Telehealth: Payer: Self-pay | Admitting: Medical

## 2017-05-13 DIAGNOSIS — A749 Chlamydial infection, unspecified: Secondary | ICD-10-CM

## 2017-05-13 LAB — CULTURE, OB URINE

## 2017-05-13 LAB — GC/CHLAMYDIA PROBE AMP (~~LOC~~) NOT AT ARMC
Chlamydia: POSITIVE — AB
NEISSERIA GONORRHEA: NEGATIVE

## 2017-05-13 MED ORDER — AZITHROMYCIN 250 MG PO TABS: 1000.0000 mg | ORAL_TABLET | Freq: Once | ORAL | 0 refills | Status: AC

## 2017-05-13 NOTE — Telephone Encounter (Addendum)
Deanna Murray tested positive for  Chlamydia. Patient was called by RN and allergies and pharmacy confirmed. Rx sent to pharmacy of choice.   Deanna Murray, Deanna Lenoir N, PA-C 05/13/2017 3:02 PM       ----- Message from Kathe BectonLori S Berdik, RN sent at 05/13/2017 12:29 PM EDT ----- This patient tested positive for chlaymdia   She had NKDA . I have informed the patient of her results and confirmed her pharmacy is correct in her chart. Please send Rx.   Thank you,   Kathe BectonBerdik, Lori S, RN   [facility]

## 2017-07-24 ENCOUNTER — Emergency Department (HOSPITAL_COMMUNITY)
Admission: EM | Admit: 2017-07-24 | Discharge: 2017-07-24 | Disposition: A | Discharge status: Home / Self Care | Payer: Medicaid Other | Attending: Emergency Medicine | Admitting: Emergency Medicine

## 2017-07-24 ENCOUNTER — Encounter: Payer: Self-pay | Admitting: Emergency Medicine

## 2017-07-24 DIAGNOSIS — Y92019 Unspecified place in single-family (private) house as the place of occurrence of the external cause: Secondary | ICD-10-CM | POA: Diagnosis not present

## 2017-07-24 DIAGNOSIS — H6690 Otitis media, unspecified, unspecified ear: Secondary | ICD-10-CM

## 2017-07-24 DIAGNOSIS — Y939 Activity, unspecified: Secondary | ICD-10-CM | POA: Insufficient documentation

## 2017-07-24 DIAGNOSIS — S0993XA Unspecified injury of face, initial encounter: Secondary | ICD-10-CM | POA: Diagnosis present

## 2017-07-24 DIAGNOSIS — S01112A Laceration without foreign body of left eyelid and periocular area, initial encounter: Secondary | ICD-10-CM | POA: Diagnosis not present

## 2017-07-24 DIAGNOSIS — Z87891 Personal history of nicotine dependence: Secondary | ICD-10-CM | POA: Diagnosis not present

## 2017-07-24 DIAGNOSIS — H6691 Otitis media, unspecified, right ear: Secondary | ICD-10-CM | POA: Diagnosis not present

## 2017-07-24 DIAGNOSIS — Y999 Unspecified external cause status: Secondary | ICD-10-CM | POA: Diagnosis not present

## 2017-07-24 DIAGNOSIS — T07XXXA Unspecified multiple injuries, initial encounter: Secondary | ICD-10-CM

## 2017-07-24 LAB — POC URINE PREG, ED: Preg Test, Ur: NEGATIVE

## 2017-07-24 MED ORDER — AMOXICILLIN-POT CLAVULANATE 875-125 MG PO TABS: 1.0000 | ORAL_TABLET | Freq: Two times a day (BID) | ORAL | 0 refills | Status: DC

## 2017-07-24 MED ORDER — LIDOCAINE HCL (PF) 1 % IJ SOLN
5.0000 mL | Freq: Once | INTRAMUSCULAR | Status: AC
  Administered 2017-07-24: 5 mL via INTRADERMAL
  Filled 2017-07-24: qty 5

## 2017-07-24 MED ORDER — MELOXICAM 15 MG PO TABS: 15.0000 mg | ORAL_TABLET | Freq: Every day | ORAL | 0 refills | Status: DC

## 2017-07-24 NOTE — ED Notes (Signed)
Pt states DT is UTD. 

## 2017-07-24 NOTE — Discharge Instructions (Signed)
Patient should take her amoxicillin twice daily as directed for the next 7 days. She may take Motrin once daily in the morning with food. Apply ice to the affected areas to relieve swelling and discomfort. WOUND CARE Please have your stitches/staples removed in 5 or sooner if you have concerns. You may do this at any available urgent care or at your primary care doctor's office.  Keep area clean and dry for 24 hours. Do not remove bandage, if applied.  After 24 hours, remove bandage and wash wound gently with mild soap and warm water. Reapply a new bandage after cleaning wound, if directed.  Continue daily cleansing with soap and water until stitches/staples are removed.  Do not apply any ointments or creams to the wound while stitches/staples are in place, as this may cause delayed healing.  Seek medical careif you experience any of the following signs of infection: Swelling, redness, pus drainage, streaking, fever >101.0 F  Seek care if you experience excessive bleeding that does not stop after 15-20 minutes of constant, firm pressure.
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
## 2017-08-02 ENCOUNTER — Ambulatory Visit: Payer: Self-pay | Admitting: Obstetrics & Gynecology

## 2017-08-02 ENCOUNTER — Ambulatory Visit: Payer: Self-pay | Admitting: Family Medicine

## 2017-10-15 ENCOUNTER — Other Ambulatory Visit: Payer: Self-pay

## 2017-10-15 ENCOUNTER — Emergency Department (HOSPITAL_COMMUNITY)
Admission: EM | Admit: 2017-10-15 | Discharge: 2017-10-15 | Disposition: A | Discharge status: Home / Self Care | Payer: Medicaid Other | Attending: Emergency Medicine | Admitting: Emergency Medicine

## 2017-10-15 ENCOUNTER — Emergency Department (HOSPITAL_COMMUNITY): Payer: Medicaid Other

## 2017-10-15 DIAGNOSIS — R0602 Shortness of breath: Secondary | ICD-10-CM | POA: Diagnosis present

## 2017-10-15 DIAGNOSIS — J209 Acute bronchitis, unspecified: Secondary | ICD-10-CM | POA: Diagnosis not present

## 2017-10-15 DIAGNOSIS — Z79899 Other long term (current) drug therapy: Secondary | ICD-10-CM | POA: Diagnosis not present

## 2017-10-15 DIAGNOSIS — Z87891 Personal history of nicotine dependence: Secondary | ICD-10-CM | POA: Diagnosis not present

## 2017-10-15 DIAGNOSIS — J069 Acute upper respiratory infection, unspecified: Secondary | ICD-10-CM | POA: Insufficient documentation

## 2017-10-15 MED ORDER — PREDNISONE 10 MG (21) PO TBPK: ORAL_TABLET | Freq: Every day | ORAL | 0 refills | Status: DC

## 2017-10-15 MED ORDER — AEROCHAMBER PLUS W/MASK MISC
1.0000 | Freq: Once | Status: AC
  Administered 2017-10-15: 1

## 2017-10-15 MED ORDER — DM-GUAIFENESIN ER 30-600 MG PO TB12: 1.0000 | ORAL_TABLET | Freq: Two times a day (BID) | ORAL | 0 refills | Status: DC

## 2017-10-15 MED ORDER — ALBUTEROL SULFATE (2.5 MG/3ML) 0.083% IN NEBU
5.0000 mg | INHALATION_SOLUTION | Freq: Once | RESPIRATORY_TRACT | Status: AC
  Administered 2017-10-15: 5 mg via RESPIRATORY_TRACT
  Filled 2017-10-15: qty 6

## 2017-10-15 MED ORDER — ALBUTEROL SULFATE HFA 108 (90 BASE) MCG/ACT IN AERS
2.0000 | INHALATION_SPRAY | Freq: Once | RESPIRATORY_TRACT | Status: AC
  Administered 2017-10-15: 2 via RESPIRATORY_TRACT
  Filled 2017-10-15: qty 6.7

## 2017-10-15 NOTE — ED Triage Notes (Signed)
Pt states 3 days of shortness of breath. Had a cough 3 days ago but not cough today. Denies fevers, denies sore throat, denies runny nose, denies headache. Pt states she just feels like she cannot breath. 100% O2 sats, no acute distress noted.

## 2017-10-15 NOTE — ED Notes (Signed)
Pt stable, ambulatory, states understanding of discharge instructions 

## 2017-10-15 NOTE — ED Provider Notes (Signed)
[facility] EMERGENCY DEPARTMENT Provider Note   CSN: [id number] Arrival date & time: 10/15/17  0944     History   Chief Complaint Chief Complaint  Patient presents with  . Shortness of Breath    HPI Deanna Murray is a 26 y.o. female who presents with SOB/Cough. She had onset of URI sxs 4 days ago.  She has been using over-the-counter cough medicine which did improve her symptoms.  Yesterday she was walking up the stairs when she felt her chest tightening and then began having a paroxysm of uncontrolled coughing and shortness of breath.  Patient states that this was recurrent throughout the night.  She states that she is going to come to the hospitalist night but did not have anyone to watch her children.  She has never had anything like this before.  She denies a history of asthma.  She denies any history of smoking.  She was given treatment with a DuoNeb prior to my evaluation and feels greatly improved.  HPI  Past Medical History:  Diagnosis Date  . Anemia   . Anxiety   . Bipolar 1 disorder (HCC)   . Gonorrhea   . Headache    tylenol prn  . HSV (herpes simplex virus) infection   . postpartum depression 2007  . Preeclampsia 2007   2007 with first pregnancy only -no problems since  . Pregnancy    7 months  . Seizure [facility])    last one at age 34 yrs old - none since  . SVD (spontaneous vaginal delivery)    x 3    Patient Active Problem List   Diagnosis Date Noted  . 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 Trichomoniasis 07/12/2016  . 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 Seizure disorder (HCC) 04/27/2016  . Bipolar disorder (HCC) 04/27/2016  . Iron deficiency anemia 12/27/2013  . Abnormal Pap smear of cervix 10/12/2013    Past Surgical History:  Procedure Laterality Date  . CESAREAN SECTION N/A 09/08/2016   Procedure: CESAREAN SECTION;  Surgeon: Reva Bores, MD;  Location: [facility] BIRTHING SUITES;  Service: Obstetrics;   Laterality: N/A;  . NO PAST SURGERIES      OB History    Gravida Para Term Preterm AB Living   SAB TAB Ectopic Multiple Live Births         0 4       Home Medications    Prior to Admission medications   Medication Sig Start Date End Date Taking? Authorizing Provider  ferrous sulfate 325 (65 FE) MG tablet Take 325 mg by mouth daily with breakfast.   Yes [provider]  guaiFENesin (ROBITUSSIN) 100 MG/5ML liquid Take 200 mg by mouth 3 (three) times daily as needed for cough.   Yes [provider]  amoxicillin-clavulanate (AUGMENTIN) 875-125 MG tablet Take 1 tablet by mouth 2 (two) times daily. One po bid x 7 days Patient not taking: Reported on 10/15/2017 07/24/17   Arthor Captain, PA-C  dextromethorphan-guaiFENesin [facility] DM) 30-600 MG 12hr tablet Take 1 tablet by mouth 2 (two) times daily. 10/15/17   Arthor Captain, PA-C  meloxicam (MOBIC) 15 MG tablet Take 1 tablet (15 mg total) by mouth daily. Take 1 daily with food. Patient not taking: Reported on 10/15/2017 07/24/17   Arthor Captain, PA-C  predniSONE (STERAPRED UNI-PAK 21 TAB) 10 MG (21) TBPK tablet Take by mouth daily. Take 6 tabs by  mouth daily  for 2 days, then 5 tabs for 2 days, then 4 tabs for 2 days, then 3 tabs for 2 days, 2 tabs for 2 days, then 1 tab by mouth daily for 2 days 10/15/17   Arthor CaptainHarris, Aaron Boeh, PA-C    Family History Family History  Problem Relation Age of Onset  . Seizures Father   . Alcohol abuse Neg Hx   . Arthritis Neg Hx   . Asthma Neg Hx   . Birth defects Neg Hx   . Cancer Neg Hx   . COPD Neg Hx   . Depression Neg Hx   . Diabetes Neg Hx   . Drug abuse Neg Hx   . Early death Neg Hx   . Hearing loss Neg Hx   . Heart disease Neg Hx   . Hyperlipidemia Neg Hx   . Hypertension Neg Hx   . Kidney disease Neg Hx   . Learning disabilities Neg Hx   . Mental illness Neg Hx   . Mental retardation Neg Hx   . Miscarriages / Stillbirths Neg Hx   . Stroke Neg Hx   .  Vision loss Neg Hx     Social History Social History   Tobacco Use  . Smoking status: Former Smoker    Packs/day: 0.50    Years: 12.00    Pack years: 6.00    Types: Cigarettes    Last attempt to quit: 04/02/2016    Years since quitting: 1.5  . Smokeless tobacco: Never Used  Substance Use Topics  . Alcohol use: Yes    Alcohol/week: 9.6 oz    Types: 16 Shots of liquor per week    Comment: None since 04/2016  . Drug use: No     Allergies   Patient has no known allergies.   Review of Systems Review of Systems  Ten systems reviewed and are negative for acute change, except as noted in the HPI.   Physical Exam Updated Vital Signs BP 130/79   Pulse 87   Temp 97.6 F (36.4 C)   Resp (!) 21   SpO2 100%   Physical Exam  Constitutional: She is oriented to person, place, and time. She appears well-developed and well-nourished. No distress.  HENT:  Head: Normocephalic and atraumatic.  Eyes: Conjunctivae are normal. No scleral icterus.  Neck: Normal range of motion.  Cardiovascular: Normal rate, regular rhythm and normal heart sounds. Exam reveals no gallop and no friction rub.  No murmur heard. Pulmonary/Chest: Effort normal and breath sounds normal. No respiratory distress. She has no decreased breath sounds. She has no wheezes. She has no rhonchi. She has no rales.  Abdominal: Soft. Bowel sounds are normal. She exhibits no distension and no mass. There is no tenderness. There is no guarding.  Neurological: She is alert and oriented to person, place, and time.  Skin: Skin is warm and dry. She is not diaphoretic.  Psychiatric: Her behavior is normal.  Nursing note and vitals reviewed.    ED Treatments / Results  Labs (all labs ordered are listed, but only abnormal results are displayed) Labs Reviewed - No data to display  EKG  EKG Interpretation None       Radiology Dg Chest 2 View  Result Date: 10/15/2017 CLINICAL DATA:  Shortness of breath.  Wheezing.   Lightheadedness. EXAM: CHEST  2 VIEW COMPARISON:  None. FINDINGS: The heart size and mediastinal contours are within normal limits. Both lungs are clear. The visualized skeletal structures are unremarkable.  IMPRESSION: Negative two view chest x-ray Electronically Signed   By: Marin Roberts M.D.   On: 10/15/2017 10:44    Procedures Procedures (including critical care time)  Medications Ordered in ED Medications  albuterol (PROVENTIL HFA;VENTOLIN HFA) 108 (90 Base) MCG/ACT inhaler 2 puff (not administered)  aerochamber plus with mask device 1 each (not administered)  albuterol (PROVENTIL) (2.5 MG/3ML) 0.083% nebulizer solution 5 mg (5 mg Nebulization Given 10/15/17 1004)     Initial Impression / Assessment and Plan / ED Course  I have reviewed the triage vital signs and the nursing notes.  Pertinent labs & imaging results that were available during my care of the patient were reviewed by me and considered in my medical decision making (see chart for details).     Patient EKG and chest x-ray are negative.  This appears to be reactive bronchitis.  Patient will be discharged with Sterapred dosepak, cough suppressant and albuterol inhaler.  I discussed return precautions with the patient.  She is PERC negative.  Appears appropriate for discharge at this time.  Final Clinical Impressions(s) / ED Diagnoses   Final diagnoses:  Upper respiratory tract infection, unspecified type  Bronchospasm with bronchitis, acute    ED Discharge Orders        Ordered    predniSONE (STERAPRED UNI-PAK 21 TAB) 10 MG (21) TBPK tablet  Daily     10/15/17 1123    dextromethorphan-guaiFENesin (MUCINEX DM) 30-600 MG 12hr tablet  2 times daily     10/15/17 1123       Arthor Captain, PA-C 10/15/17 1136    Tegeler, Canary Brim, MD 10/15/17 2103

## 2017-10-15 NOTE — Discharge Instructions (Signed)
You may use your inhaler 2 puffs every 4 hours. You appear to have an upper respiratory infection (URI). An upper respiratory tract infection, or cold, is a viral infection of the air passages leading to the lungs. It is contagious and can be spread to others, especially during the first 3 or 4 days. It cannot be cured by antibiotics or other medicines. RETURN IMMEDIATELY IF you develop shortness of breath, confusion or altered mental status, a new rash, become dizzy, faint, or poorly responsive, or are unable to be cared for at home.

## 2018-02-07 ENCOUNTER — Encounter: Payer: Self-pay | Admitting: *Deleted

## 2018-03-14 ENCOUNTER — Ambulatory Visit: Payer: Self-pay | Admitting: Family Medicine

## 2018-07-17 ENCOUNTER — Emergency Department (HOSPITAL_COMMUNITY)
Admission: EM | Admit: 2018-07-17 | Discharge: 2018-07-17 | Discharge status: Against Medical Advice | Payer: Medicaid Other | Attending: Emergency Medicine | Admitting: Emergency Medicine

## 2018-07-17 ENCOUNTER — Encounter (HOSPITAL_COMMUNITY): Payer: Self-pay | Admitting: Emergency Medicine

## 2018-07-17 ENCOUNTER — Emergency Department (HOSPITAL_COMMUNITY): Payer: Medicaid Other

## 2018-07-17 DIAGNOSIS — R0600 Dyspnea, unspecified: Secondary | ICD-10-CM | POA: Diagnosis not present

## 2018-07-17 DIAGNOSIS — Z87891 Personal history of nicotine dependence: Secondary | ICD-10-CM | POA: Diagnosis not present

## 2018-07-17 DIAGNOSIS — R0602 Shortness of breath: Secondary | ICD-10-CM | POA: Diagnosis present

## 2018-07-17 LAB — CBC
HCT: 42.2 % (ref 36.0–46.0)
Hemoglobin: 12.9 g/dL (ref 12.0–15.0)
MCH: 26.5 pg (ref 26.0–34.0)
MCHC: 30.6 g/dL (ref 30.0–36.0)
MCV: 86.7 fL (ref 78.0–100.0)
PLATELETS: 285 10*3/uL (ref 150–400)
RBC: 4.87 MIL/uL (ref 3.87–5.11)
RDW: 13.8 % (ref 11.5–15.5)
WBC: 7.8 10*3/uL (ref 4.0–10.5)

## 2018-07-17 LAB — BASIC METABOLIC PANEL
ANION GAP: 9 (ref 5–15)
BUN: 7 mg/dL (ref 6–20)
CO2: 25 mmol/L (ref 22–32)
Calcium: 9.3 mg/dL (ref 8.9–10.3)
Chloride: 106 mmol/L (ref 98–111)
Creatinine, Ser: 0.77 mg/dL (ref 0.44–1.00)
Glucose, Bld: 124 mg/dL — ABNORMAL HIGH (ref 70–99)
Potassium: 3.9 mmol/L (ref 3.5–5.1)
SODIUM: 140 mmol/L (ref 135–145)

## 2018-07-17 LAB — TYPE AND SCREEN
ABO/RH(D): O POS
ANTIBODY SCREEN: NEGATIVE

## 2018-07-17 LAB — D-DIMER, QUANTITATIVE: D-Dimer, Quant: 0.51 ug/mL-FEU — ABNORMAL HIGH (ref 0.00–0.50)

## 2018-07-17 LAB — ABO/RH: ABO/RH(D): O POS

## 2018-07-17 NOTE — ED Triage Notes (Addendum)
Reports feeling SOB for the last two days.  Denies having any cough.  Also endorses feeling lightheaded.  Also reports hx of anemia and has not taken iron pill in a year.

## 2018-07-17 NOTE — ED Notes (Signed)
Attempted to contact pt at [phone]. No answer.

## 2018-07-17 NOTE — ED Notes (Addendum)
Upon entering room to start IV and obtain additional labs pt was not in the room. Monitoring equipment on bed. No pt belongings in room. RN checked 3 closest bathrooms and could not locate pt. EDP notified.

## 2018-07-17 NOTE — ED Notes (Signed)
Called lab to add on D dimer  

## 2018-07-17 NOTE — ED Notes (Signed)
Nurse will start IV and collect labs.

## 2018-07-17 NOTE — ED Provider Notes (Addendum)
[facility] EMERGENCY DEPARTMENT Provider Note   CSN: [id number] Arrival date & time: 07/17/18  0158     History   Chief Complaint Chief Complaint  Patient presents with  . Shortness of Breath    HPI Deanna Murray is a 27 y.o. female.  The history is provided by the patient.  Shortness of Breath  This is a recurrent problem. The problem occurs continuously.The current episode started 6 to 12 hours ago. The problem has not changed since onset.Pertinent negatives include no fever, no headaches, no coryza, no rhinorrhea, no sore throat, no swollen glands, no ear pain, no neck pain, no cough, no sputum production, no hemoptysis, no wheezing, no PND, no orthopnea, no chest pain, no syncope, no vomiting, no abdominal pain, no rash, no leg pain, no leg swelling and no claudication. It is unknown what precipitated the problem. She has tried nothing for the symptoms. The treatment provided no relief.    Past Medical History:  Diagnosis Date  . Anemia   . Anxiety   . Bipolar 1 disorder (HCC)   . Gonorrhea   . Headache    tylenol prn  . HSV (herpes simplex virus) infection   . postpartum depression 2007  . Preeclampsia 2007   2007 with first pregnancy only -no problems since  . Pregnancy    7 months  . Seizure [facility])    last one at age 39 yrs old - none since  . SVD (spontaneous vaginal delivery)    x 3    Patient Active Problem List   Diagnosis Date Noted  . 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 Trichomoniasis 07/12/2016  . 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 Seizure disorder (HCC) 04/27/2016  . Bipolar disorder (HCC) 04/27/2016  . Iron deficiency anemia 12/27/2013  . Abnormal Pap smear of cervix 10/12/2013    Past Surgical History:  Procedure Laterality Date  . CESAREAN SECTION N/A 09/08/2016   Procedure: CESAREAN SECTION;  Surgeon: Reva Bores, MD;  Location: [facility] BIRTHING SUITES;  Service: Obstetrics;  Laterality: N/A;  .  NO PAST SURGERIES       OB History    Gravida  4   Para  4   Term  4   Preterm      AB      Living  4     SAB      TAB      Ectopic      Multiple  0   Live Births  4            Home Medications    Prior to Admission medications   Medication Sig Start Date End Date Taking? Authorizing Provider  dextromethorphan-guaiFENesin (MUCINEX DM) 30-600 MG 12hr tablet Take 1 tablet by mouth 2 (two) times daily. Patient not taking: Reported on 07/17/2018 10/15/17   Arthor Captain, PA-C  meloxicam (MOBIC) 15 MG tablet Take 1 tablet (15 mg total) by mouth daily. Take 1 daily with food. Patient not taking: Reported on 10/15/2017 07/24/17   Arthor Captain, PA-C    Family History Family History  Problem Relation Age of Onset  . Seizures Father   . Alcohol abuse Neg Hx   . Arthritis Neg Hx   . Asthma Neg Hx   . Birth defects Neg Hx   . Cancer Neg Hx   . COPD Neg Hx   . Depression Neg Hx   . Diabetes Neg Hx   . Drug abuse  Neg Hx   . Early death Neg Hx   . Hearing loss Neg Hx   . Heart disease Neg Hx   . Hyperlipidemia Neg Hx   . Hypertension Neg Hx   . Kidney disease Neg Hx   . Learning disabilities Neg Hx   . Mental illness Neg Hx   . Mental retardation Neg Hx   . Miscarriages / Stillbirths Neg Hx   . Stroke Neg Hx   . Vision loss Neg Hx     Social History Social History   Tobacco Use  . Smoking status: Former Smoker    Packs/day: 0.50    Years: 12.00    Pack years: 6.00    Types: Cigarettes    Last attempt to quit: 04/02/2016    Years since quitting: 2.2  . Smokeless tobacco: Never Used  Substance Use Topics  . Alcohol use: Yes    Alcohol/week: 16.0 standard drinks    Types: 16 Shots of liquor per week    Comment: None since 04/2016  . Drug use: No     Allergies   Patient has no known allergies.   Review of Systems Review of Systems  Constitutional: Negative for diaphoresis and fever.  HENT: Negative for ear pain, rhinorrhea and sore  throat.   Respiratory: Positive for shortness of breath. Negative for cough, hemoptysis, sputum production, chest tightness and wheezing.   Cardiovascular: Negative for chest pain, palpitations, orthopnea, claudication, leg swelling, syncope and PND.  Gastrointestinal: Negative for abdominal pain and vomiting.  Musculoskeletal: Negative for neck pain.  Skin: Negative for rash.  Neurological: Negative for headaches.  All other systems reviewed and are negative.    Physical Exam Updated Vital Signs BP 113/78   Pulse 81   Temp 98.5 F (36.9 C) (Oral)   Resp 11   Ht 5\' 5"  (1.651 m)   Wt 83.9 kg   SpO2 100%   BMI 30.79 kg/m   Physical Exam  Constitutional: She is oriented to person, place, and time. She appears well-developed and well-nourished. No distress.  HENT:  Head: Normocephalic and atraumatic.  Right Ear: External ear normal.  Left Ear: External ear normal.  Nose: Nose normal.  Mouth/Throat: Oropharynx is clear and moist. No oropharyngeal exudate.  Eyes: Pupils are equal, round, and reactive to light. Conjunctivae are normal.  Neck: Normal range of motion. Neck supple.  Cardiovascular: Normal rate, regular rhythm, normal heart sounds and intact distal pulses.  Pulmonary/Chest: Effort normal and breath sounds normal. No stridor. She has no wheezes. She has no rales.  Abdominal: Soft. Bowel sounds are normal. She exhibits no mass. There is no tenderness. There is no rebound and no guarding.  Musculoskeletal: Normal range of motion. She exhibits no edema, tenderness or deformity.  Neurological: She is alert and oriented to person, place, and time.  Skin: Skin is warm and dry. Capillary refill takes less than 2 seconds.  Psychiatric: She has a normal mood and affect.  Nursing note and vitals reviewed.    ED Treatments / Results  Labs (all labs ordered are listed, but only abnormal results are displayed) Results for orders placed or performed during the hospital  encounter of 07/17/18  CBC  Result Value Ref Range   WBC 7.8 4.0 - 10.5 K/uL   RBC 4.87 3.87 - 5.11 MIL/uL   Hemoglobin 12.9 12.0 - 15.0 g/dL   HCT 16.1 09.6 - 04.5 %   MCV 86.7 78.0 - 100.0 fL   MCH 26.5 26.0 -  34.0 pg   MCHC 30.6 30.0 - 36.0 g/dL   RDW 16.113.8 09.611.5 - 04.515.5 %   Platelets 285 150 - 400 K/uL  Basic metabolic panel  Result Value Ref Range   Sodium 140 135 - 145 mmol/L   Potassium 3.9 3.5 - 5.1 mmol/L   Chloride 106 98 - 111 mmol/L   CO2 25 22 - 32 mmol/L   Glucose, Bld 124 (H) 70 - 99 mg/dL   BUN 7 6 - 20 mg/dL   Creatinine, Ser 4.090.77 0.44 - 1.00 mg/dL   Calcium 9.3 8.9 - 81.110.3 mg/dL   GFR calc non Af Amer >60 >60 mL/min   GFR calc Af Amer >60 >60 mL/min   Anion gap 9 5 - 15  D-dimer, quantitative (not at [facility])  Result Value Ref Range   D-Dimer, Quant 0.51 (H) 0.00 - 0.50 ug/mL-FEU  Type and screen [facility]  Result Value Ref Range   ABO/RH(D) O POS    Antibody Screen NEG    Sample Expiration      07/20/2018 Performed at [facility], [address] [place], [place] [address]   ABO/Rh  Result Value Ref Range   ABO/RH(D)      O POS Performed at [facility], [address] [place], [place] [address]    Dg Chest 2 View  Result Date: 07/17/2018 CLINICAL DATA:  Shortness of breath for 2 days. EXAM: CHEST - 2 VIEW COMPARISON:  Radiograph 10/15/2017 FINDINGS: The cardiomediastinal contours are normal. The lungs are clear. Pulmonary vasculature is normal. No consolidation, pleural effusion, or pneumothorax. No acute osseous abnormalities are seen. IMPRESSION: No acute pulmonary process. Electronically Signed   By: Narda RutherfordMelanie  Sanford M.D.   On: 07/17/2018 02:29    EKG EKG Interpretation  Date/Time:  Sunday July 17 2018 02:03:11 EDT Ventricular Rate:  88 PR Interval:  136 QRS Duration: 88 QT Interval:  360 QTC Calculation: 435 R Axis:   72 Text Interpretation:  Normal sinus rhythm Confirmed by Nicanor AlconPalumbo, Fumi Guadron ([id number]) on  07/17/2018 3:27:02 AM   Radiology Dg Chest 2 View  Result Date: 07/17/2018 CLINICAL DATA:  Shortness of breath for 2 days. EXAM: CHEST - 2 VIEW COMPARISON:  Radiograph 10/15/2017 FINDINGS: The cardiomediastinal contours are normal. The lungs are clear. Pulmonary vasculature is normal. No consolidation, pleural effusion, or pneumothorax. No acute osseous abnormalities are seen. IMPRESSION: No acute pulmonary process. Electronically Signed   By: Narda RutherfordMelanie  Sanford M.D.   On: 07/17/2018 02:29    Procedures Procedures (including critical care time)  Medications Ordered in ED Medications - No data to display     Final Clinical Impressions(s) / ED Diagnoses   Patient eloped during the work up    Nicanor AlconPalumbo, Ellenie Salome, MD 07/17/18 [id number] [id number]    Kolbie Clarkston, MD 07/17/18 [id number]

## 2018-10-13 ENCOUNTER — Other Ambulatory Visit (HOSPITAL_COMMUNITY)
Admission: RE | Admit: 2018-10-13 | Discharge: 2018-10-13 | Disposition: A | Discharge status: Home / Self Care | Payer: Medicaid Other | Source: Ambulatory Visit | Attending: Family Medicine | Admitting: Family Medicine

## 2018-10-13 ENCOUNTER — Ambulatory Visit (INDEPENDENT_AMBULATORY_CARE_PROVIDER_SITE_OTHER): Payer: Medicaid Other | Admitting: Family Medicine

## 2018-10-13 ENCOUNTER — Encounter: Payer: Self-pay | Admitting: Family Medicine

## 2018-10-13 VITALS — BP 123/75 | HR 87 | Ht 64.0 in | Wt 189.8 lb

## 2018-10-13 DIAGNOSIS — Z0000 Encounter for general adult medical examination without abnormal findings: Secondary | ICD-10-CM

## 2018-10-13 DIAGNOSIS — R87611 Atypical squamous cells cannot exclude high grade squamous intraepithelial lesion on cytologic smear of cervix (ASC-H): Secondary | ICD-10-CM

## 2018-10-13 DIAGNOSIS — Z8742 Personal history of other diseases of the female genital tract: Secondary | ICD-10-CM

## 2018-10-13 DIAGNOSIS — Z3046 Encounter for surveillance of implantable subdermal contraceptive: Secondary | ICD-10-CM

## 2018-10-13 DIAGNOSIS — N898 Other specified noninflammatory disorders of vagina: Secondary | ICD-10-CM

## 2018-10-13 NOTE — Patient Instructions (Signed)

## 2018-10-13 NOTE — Progress Notes (Signed)
   GYNECOLOGY OFFICE VISIT NOTE History:  27 y.o. [phone] here today for Nexplanon removal. Desires future pregnancy, she and partner want one more child.   - has noticed a little bit of discharge, more gets occasional discomfort in vagina, pelvic area - denies new sexual partners - reports no periods in last several months - had trouble with irregular periods before getting pregnant but still got pregnant   The following portions of the patient's history were reviewed and updated as appropriate: allergies, current medications, past family history, past medical history, past social history, past surgical history and problem list.   Health Maintenance:  ASCUS-H in 2014, no follow-up noted.  Patient unsure of history. States never had cervical biopsy or colposcopy.   Review of Systems:  Pertinent items noted in HPI ROS  Objective:  Physical Exam BP 123/75   Pulse 87   Ht 5\' 4"  (1.626 m)   Wt 189 lb 12.8 oz (86.1 kg)   BMI 32.58 kg/m  Physical Exam  Constitutional: She is oriented to person, place, and time. She appears well-developed and well-nourished. No distress.  HENT:  Head: Normocephalic and atraumatic.  Eyes: Conjunctivae and EOM are normal.  Cardiovascular: Normal rate.  Pulmonary/Chest: No respiratory distress.  Genitourinary:    Genitourinary Comments: Normal appearing labia  moderate amount of foul-smelling creamy white discharge  cervical friability with small amount of bleeding noted after collection of pap smear  no CMT, no ovarian fullness, anteverted uterus of normal size palpated on bimanual exam    Neurological: She is alert and oriented to person, place, and time.  Psychiatric: She has a normal mood and affect. Her behavior is normal.  Nursing note and vitals reviewed.   Labs and Imaging No results found for this or any previous visit (from the past 168 hour(s)). No results found.  Assessment & Plan:   43WU 27 with PMH of ASCUS-H who presents for  Pap smear and Nexplanon removal.   Nexplanon Removal Patient identified, informed consent performed, consent signed.   Appropriate time out taken. Nexplanon site identified.  Area prepped in usual sterile fashon. Two ml of 1% lidocaine was used to anesthetize the area at the distal end of the implant. A small stab incision was made right beside the implant on the distal portion.  The Nexplanon rod was grasped using hemostats and removed without difficulty.  There was minimal blood loss. There were no complications.   A pressure bandage was applied to reduce any bruising.  The patient tolerated the procedure well and was given post procedure instructions.  Patient is planning on future pregnancy. Counseled on initiating prenatal vitamins.   Healthcare Maintenance  History of ASCUS-H Repeat Pap smear today   Bacterial Vaginosis: Foul-smelling discharge and symptoms of discomfort. Wet prep collected, will await results prior to treatment. Reviewed vaginal hygiene.   Cristal DeerLaurel S. Earlene PlaterWallace, DO OB Family Medicine Fellow, [facility], [facility]

## 2018-10-17 LAB — CYTOLOGY - PAP: DIAGNOSIS: NEGATIVE

## 2018-10-18 LAB — CERVICOVAGINAL ANCILLARY ONLY
BACTERIAL VAGINITIS: POSITIVE — AB
CANDIDA VAGINITIS: POSITIVE — AB
Chlamydia: POSITIVE — AB
Neisseria Gonorrhea: NEGATIVE
Trichomonas: NEGATIVE

## 2018-10-21 ENCOUNTER — Other Ambulatory Visit: Payer: Self-pay | Admitting: Family Medicine

## 2018-10-22 MED ORDER — METRONIDAZOLE 500 MG PO TABS: 500.0000 mg | ORAL_TABLET | Freq: Two times a day (BID) | ORAL | 0 refills | Status: DC

## 2018-10-22 MED ORDER — AZITHROMYCIN 250 MG PO TABS: 1000.0000 mg | ORAL_TABLET | Freq: Once | ORAL | 0 refills | Status: AC

## 2018-10-22 MED ORDER — FLUCONAZOLE 150 MG PO TABS: 150.0000 mg | ORAL_TABLET | Freq: Once | ORAL | 0 refills | Status: DC | PRN

## 2018-10-22 NOTE — Addendum Note (Signed)
Addended by: Tamera StandsWALLACE, Melrose Kearse S on: 10/22/2018 12:03 AM   Modules accepted: Orders

## 2018-10-31 ENCOUNTER — Other Ambulatory Visit: Payer: Self-pay | Admitting: Family Medicine

## 2018-10-31 DIAGNOSIS — N898 Other specified noninflammatory disorders of vagina: Secondary | ICD-10-CM

## 2018-10-31 MED ORDER — FLUCONAZOLE 150 MG PO TABS: 150.0000 mg | ORAL_TABLET | Freq: Once | ORAL | 0 refills | Status: DC | PRN

## 2018-10-31 MED ORDER — METRONIDAZOLE 500 MG PO TABS: 500.0000 mg | ORAL_TABLET | Freq: Two times a day (BID) | ORAL | 0 refills | Status: DC

## 2018-10-31 MED ORDER — AZITHROMYCIN 250 MG PO TABS: 1000.0000 mg | ORAL_TABLET | Freq: Once | ORAL | 0 refills | Status: AC

## 2018-10-31 NOTE — Telephone Encounter (Signed)
Called patient to review results. Patient has not yet picked up prescriptions but does report discomfort is doing better. Instructed to pick up antibiotics from pharmacy (metronidazole, azithromycin) as well as diflucan for yeast infection. Patient voiced understanding.   Deanna Murray S. Deanna PlaterWallace, DO OB/GYN Fellow

## 2019-02-17 ENCOUNTER — Other Ambulatory Visit: Payer: Self-pay

## 2019-02-17 ENCOUNTER — Emergency Department (HOSPITAL_COMMUNITY)
Admission: EM | Admit: 2019-02-17 | Discharge: 2019-02-17 | Discharge status: Against Medical Advice | Payer: Medicaid Other | Attending: Emergency Medicine | Admitting: Emergency Medicine

## 2019-02-17 ENCOUNTER — Encounter (HOSPITAL_COMMUNITY): Payer: Self-pay | Admitting: Emergency Medicine

## 2019-02-17 DIAGNOSIS — Z5321 Procedure and treatment not carried out due to patient leaving prior to being seen by health care provider: Secondary | ICD-10-CM | POA: Insufficient documentation

## 2019-02-17 DIAGNOSIS — R0602 Shortness of breath: Secondary | ICD-10-CM | POA: Diagnosis present

## 2019-02-17 MED ORDER — ALBUTEROL SULFATE (2.5 MG/3ML) 0.083% IN NEBU: 5.0000 mg | INHALATION_SOLUTION | Freq: Once | RESPIRATORY_TRACT | Status: DC

## 2019-02-17 NOTE — ED Notes (Signed)
Radiology notified sort they have tried to call the patient several times in the waiting area without response. Called pt name in waiting area again, no response.

## 2019-02-17 NOTE — ED Notes (Signed)
Pt called additional x 2 for vitals check, no response

## 2019-02-17 NOTE — ED Triage Notes (Signed)
Pt reports she had a "panic attack" earlier today, since then she reports "having trouble breathing", states she "has to take more deep breaths."  Pt does not appear to be experiencing any respiratory distress at this time. Clear lung sounds

## 2019-11-15 ENCOUNTER — Encounter (HOSPITAL_COMMUNITY): Payer: Self-pay | Admitting: Emergency Medicine

## 2019-11-15 ENCOUNTER — Other Ambulatory Visit: Payer: Self-pay

## 2019-11-15 ENCOUNTER — Emergency Department (HOSPITAL_COMMUNITY)
Admission: EM | Admit: 2019-11-15 | Discharge: 2019-11-15 | Discharge status: Against Medical Advice | Payer: Medicaid Other | Attending: Emergency Medicine | Admitting: Emergency Medicine

## 2019-11-15 DIAGNOSIS — M546 Pain in thoracic spine: Secondary | ICD-10-CM | POA: Diagnosis present

## 2019-11-15 DIAGNOSIS — Z5321 Procedure and treatment not carried out due to patient leaving prior to being seen by health care provider: Secondary | ICD-10-CM | POA: Diagnosis not present

## 2019-11-15 NOTE — ED Triage Notes (Signed)
Patient reports right upper back pain onset this week , denies injury , respirations unlabored, no fever or chills , pain increases with movement /coughing.

## 2019-11-15 NOTE — ED Notes (Signed)
PT called x3, no answer  

## 2019-11-19 ENCOUNTER — Other Ambulatory Visit: Payer: Self-pay

## 2019-11-19 ENCOUNTER — Encounter (HOSPITAL_COMMUNITY): Payer: Self-pay

## 2019-11-19 ENCOUNTER — Ambulatory Visit (HOSPITAL_COMMUNITY)
Admission: EM | Admit: 2019-11-19 | Discharge: 2019-11-19 | Disposition: A | Discharge status: Home / Self Care | Payer: Medicaid Other | Attending: Family Medicine | Admitting: Family Medicine

## 2019-11-19 DIAGNOSIS — R1011 Right upper quadrant pain: Secondary | ICD-10-CM | POA: Diagnosis not present

## 2019-11-19 DIAGNOSIS — Z3202 Encounter for pregnancy test, result negative: Secondary | ICD-10-CM | POA: Diagnosis not present

## 2019-11-19 LAB — POCT PREGNANCY, URINE: Preg Test, Ur: NEGATIVE

## 2019-11-19 LAB — POC URINE PREG, ED: Preg Test, Ur: NEGATIVE

## 2019-11-19 NOTE — ED Triage Notes (Addendum)
Pt reports having right upper quadrant pain with radiation to her right arm and right upper back x 5 days, no know injuries. Pain is worse when pt cough or sneeze. Pt reports she had a positiev pregnancy test 2 days ago.

## 2019-11-19 NOTE — Discharge Instructions (Signed)
The pain you are having could represent a gallbladder problem I recommend that you go to the emergency room for additional testing and possible ultrasound

## 2019-11-19 NOTE — ED Provider Notes (Signed)
[facility]    CSN: [id number] Arrival date & time: 11/19/19  1601      History   Chief Complaint Chief Complaint  Patient presents with  . Back Pain  . Possible Pregnancy    HPI Deanna Murray is a 29 y.o. female.   HPI Patient has had right upper abdominal pain for the last 5 days.  It is getting worse.  It is radiating up towards her right shoulder blade.  She has decreased appetite.  Waves of nausea.  No fever or chills.  She states it hurts with movement.  Hurts with coughing and sneezing.  She has had no trauma.  No vomiting.  She has not taken any medicine for the pain.  She is never had any abdominal surgeries. Patient is 29 years old.  Overweight.  She has 4 children.  She requests a pregnancy test today.  It is negative.  I recommend to the patient [facility] and obtain birth control since she tells me she does not want additional children.  She is having unprotected sexual relations.   Past Medical History:  Diagnosis Date  . Anemia   . Anxiety   . Bipolar 1 disorder (HCC)   . Gonorrhea   . Headache    tylenol prn  . HSV (herpes simplex virus) infection   . postpartum depression 2007  . Preeclampsia 2007   2007 with first pregnancy only -no problems since  . Pregnancy    7 months  . Seizure [facility])    last one at age 35 yrs old - none since  . SVD (spontaneous vaginal delivery)    x 3    Patient Active Problem List   Diagnosis Date Noted  . 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 Trichomoniasis 07/12/2016  . 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 Seizure disorder (HCC) 04/27/2016  . Bipolar disorder (HCC) 04/27/2016  . Iron deficiency anemia 12/27/2013  . Abnormal Pap smear of cervix 10/12/2013    Past Surgical History:  Procedure Laterality Date  . CESAREAN SECTION N/A 09/08/2016   Procedure: CESAREAN SECTION;  Surgeon: Reva Bores, MD;  Location: [facility];  Service:  Obstetrics;  Laterality: N/A;  . NO PAST SURGERIES      OB History    Gravida  4   Para  4   Term  4   Preterm      AB      Living  4     SAB      TAB      Ectopic      Multiple  0   Live Births  4            Home Medications    Prior to Admission medications   Medication Sig Start Date End Date Taking? Authorizing Provider  acetaminophen (TYLENOL) 500 MG tablet Take 500 mg by mouth every 6 (six) hours as needed.   Yes [provider]    Family History Family History  Problem Relation Age of Onset  . Seizures Father   . Alcohol abuse Neg Hx   . Arthritis Neg Hx   . Asthma Neg Hx   . Birth defects Neg Hx   . Cancer Neg Hx   . COPD Neg Hx   . Depression Neg Hx   . Diabetes Neg Hx   . Drug abuse Neg Hx   . Early death Neg Hx   .  Hearing loss Neg Hx   . Heart disease Neg Hx   . Hyperlipidemia Neg Hx   . Hypertension Neg Hx   . Kidney disease Neg Hx   . Learning disabilities Neg Hx   . Mental illness Neg Hx   . Mental retardation Neg Hx   . Miscarriages / Stillbirths Neg Hx   . Stroke Neg Hx   . Vision loss Neg Hx     Social History Social History   Tobacco Use  . Smoking status: Former Smoker    Packs/day: 0.50    Years: 12.00    Pack years: 6.00    Types: Cigarettes    Quit date: 04/02/2016    Years since quitting: 3.6  . Smokeless tobacco: Never Used  Substance Use Topics  . Alcohol use: Yes    Comment: occasionally  . Drug use: No     Allergies   Patient has no known allergies.   Review of Systems Review of Systems  Constitutional: Negative for chills and fever.  HENT: Negative for congestion and rhinorrhea.   Eyes: Negative for visual disturbance.  Respiratory: Negative for cough and shortness of breath.   Cardiovascular: Negative for chest pain.  Gastrointestinal: Positive for abdominal pain and nausea. Negative for vomiting.  Genitourinary: Negative for dysuria and frequency.  Musculoskeletal: Negative for  myalgias.  Neurological: Negative for dizziness and headaches.     Physical Exam Triage Vital Signs ED Triage Vitals  Enc Vitals Group     BP 11/19/19 1626 115/76     Pulse Rate 11/19/19 1626 96     Resp 11/19/19 1626 18     Temp 11/19/19 1626 98.2 F (36.8 C)     Temp Source 11/19/19 1626 Oral     SpO2 11/19/19 1626 98 %     Weight --      Height --      Head Circumference --      Peak Flow --      Pain Score 11/19/19 1624 6     Pain Loc --      Pain Edu? --      Excl. in [place]? --    No data found.  Updated Vital Signs BP 115/76 (BP Location: Right Arm)   Pulse 96   Temp 98.2 F (36.8 C) (Oral)   Resp 18   LMP 10/14/2019 (Exact Date)   SpO2 98%   Breastfeeding No    Physical Exam Constitutional:      General: She is not in acute distress.    Appearance: She is well-developed.     Comments: Patient appears uncomfortable.  Grimaces with movement  HENT:     Head: Normocephalic and atraumatic.     Mouth/Throat:     Comments: Mask in place Eyes:     Conjunctiva/sclera: Conjunctivae normal.     Pupils: Pupils are equal, round, and reactive to light.  Cardiovascular:     Rate and Rhythm: Normal rate.  Pulmonary:     Effort: Pulmonary effort is normal. No respiratory distress.  Abdominal:     General: Bowel sounds are normal.     Palpations: Abdomen is soft.     Tenderness: There is guarding. There is no right CVA tenderness or left CVA tenderness.     Comments: Protuberant abdomen.  Soft.  Bowel sounds active.  Tender to palpation in the right upper quadrant and midepigastrium.  No CVA tenderness.  No tenderness in the back.  Mild guarding.  Mild rebound.  Musculoskeletal:        General: Normal range of motion.     Cervical back: Normal range of motion.  Skin:    General: Skin is warm and dry.  Neurological:     Mental Status: She is alert.  Psychiatric:        Mood and Affect: Mood normal.        Behavior: Behavior normal.      UC Treatments /  Results  Labs (all labs ordered are listed, but only abnormal results are displayed) Labs Reviewed  POCT PREGNANCY, URINE  POC URINE PREG, ED    EKG   Radiology No results found.  Procedures Procedures (including critical care time)  Medications Ordered in UC Medications - No data to display  Initial Impression / Assessment and Plan / UC Course  I have reviewed the triage vital signs and the nursing notes.  Pertinent labs & imaging results that were available during my care of the patient were reviewed by me and considered in my medical decision making (see chart for details).  Clinical Course as of Nov 18 1720  Sun Nov 19, 2019  1646 Preg Test, Ur: NEGATIVE [YN]    Clinical Course User Index [YN] Eustace Moore, MD    I explained to the patient I was concerned about gallbladder disease.  Patient states she did not go the emergency room because the wait was very long.  I explained to her that I am unable to do ultrasound testing or CAT scan to further evaluate her abdominal pain.  She chooses to go to the emergency room for additional care. Final Clinical Impressions(s) / UC Diagnoses   Final diagnoses:  Abdominal pain, right upper quadrant     Discharge Instructions     The pain you are having could represent a gallbladder problem I recommend that you go to the emergency room for additional testing and possible ultrasound   ED Prescriptions    None     PDMP not reviewed this encounter.   Eustace Moore, MD 11/19/19 1725

## 2021-11-05 ENCOUNTER — Emergency Department (HOSPITAL_COMMUNITY)
Admission: EM | Admit: 2021-11-05 | Discharge: 2021-11-05 | Discharge status: Against Medical Advice | Payer: Medicaid Other | Attending: Emergency Medicine | Admitting: Emergency Medicine

## 2021-11-05 ENCOUNTER — Encounter (HOSPITAL_COMMUNITY): Payer: Self-pay | Admitting: Emergency Medicine

## 2021-11-05 DIAGNOSIS — R059 Cough, unspecified: Secondary | ICD-10-CM | POA: Diagnosis present

## 2021-11-05 DIAGNOSIS — U071 COVID-19: Secondary | ICD-10-CM | POA: Diagnosis not present

## 2021-11-05 LAB — RESP PANEL BY RT-PCR (FLU A&B, COVID) ARPGX2
Influenza A by PCR: NEGATIVE
Influenza B by PCR: NEGATIVE
SARS Coronavirus 2 by RT PCR: POSITIVE — AB

## 2021-11-05 NOTE — ED Notes (Signed)
Pt stated she had my chart set up and will get her results

## 2021-11-05 NOTE — ED Provider Triage Note (Signed)
Emergency Medicine Provider Triage Evaluation Note  FUSAYE WACHTEL , a 31 y.o. female  was evaluated in triage.  Pt complains of cough and bodyaches  Review of Systems  Positive: Fever and cough Negative: Shortness of breath  Physical Exam  BP 116/79 (BP Location: Left Arm)    Pulse 91    Temp 98.6 F (37 C)    Resp 15    SpO2 99%  Gen:   Awake, no distress   Resp:  Normal effort  MSK:   Moves extremities without difficulty  Other:    Medical Decision Making  Medically screening exam initiated at 10:59 AM.  Appropriate orders placed.  VERSA CRATON was informed that the remainder of the evaluation will be completed by another provider, this initial triage assessment does not replace that evaluation, and the importance of remaining in the ED until their evaluation is complete.     Elson Areas, [place] 11/05/21 1059

## 2021-11-05 NOTE — ED Triage Notes (Signed)
Patient here with complaint of a cough that started two days ago and body aches and loss of taste that started last night. Patient alert, oriented, speaking in complete sentences, ambulatory, and in no apparent distress.

## 2022-02-09 ENCOUNTER — Ambulatory Visit (HOSPITAL_COMMUNITY)
Admission: EM | Admit: 2022-02-09 | Discharge: 2022-02-09 | Disposition: A | Discharge status: Home / Self Care | Payer: Medicaid Other | Attending: Sports Medicine | Admitting: Sports Medicine

## 2022-02-09 ENCOUNTER — Ambulatory Visit (INDEPENDENT_AMBULATORY_CARE_PROVIDER_SITE_OTHER): Payer: Medicaid Other

## 2022-02-09 ENCOUNTER — Encounter (HOSPITAL_COMMUNITY): Payer: Self-pay

## 2022-02-09 DIAGNOSIS — M79672 Pain in left foot: Secondary | ICD-10-CM

## 2022-02-09 DIAGNOSIS — S9032XA Contusion of left foot, initial encounter: Secondary | ICD-10-CM

## 2022-02-09 DIAGNOSIS — R2242 Localized swelling, mass and lump, left lower limb: Secondary | ICD-10-CM | POA: Diagnosis not present

## 2022-02-09 NOTE — Discharge Instructions (Addendum)
Ice the top of the foot for 20 minutes at least 3 times a day for the next few days ? ?Compression wrap or tight socks over the foot to help with swelling ? ?You may use ibuprofen or Aleve to help with pain control and reducing swelling ? ?If symptoms or not improving over the next 2 days, recommend following up with orthopedics ?

## 2022-02-09 NOTE — ED Provider Notes (Signed)
?[place] ? ? ? ?CSN: [id number] ?Arrival date & time: 02/09/22  0915 ? ? ?  ? ?History   ?Chief Complaint ?Chief Complaint  ?Patient presents with  ? Foot Pain  ? ? ?HPI ?Deanna Murray is a 31 y.o. female here for left dorsal foot pain. ? ? ?Foot Pain ? ? ?Patient states that yesterday she was playing around with her son when her leg swung forward and she accidentally hit the top of the foot against her son's leg, she thinks a struck his knee.  She had immediate pain, was able to walk but with an antalgic gait.  She notes some swelling, she had some mild bruising yesterday.  It has improved slightly today, although still painful to walk.  She denies any previous injury to this foot.  Denies any laceration or break in the skin.  She denies any redness.  No fever or chills.  She does not have any pain at the ankle or above this of the left leg. ? ?Treatment: None so far ? ?Past Medical History:  ?Diagnosis Date  ? Anemia   ? Anxiety   ? Bipolar 1 disorder ([place])   ? Gonorrhea   ? Headache   ? tylenol prn  ? HSV (herpes simplex virus) infection   ? postpartum depression 2007  ? Preeclampsia 2007  ? 2007 with first pregnancy only -no problems since  ? Pregnancy   ? 7 months  ? Seizure ([place])   ? last one at age 6 yrs old - none since  ? SVD (spontaneous vaginal delivery)   ? x 3  ? ? ?Patient Active Problem List  ? Diagnosis Date Noted  ? 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 Trichomoniasis 07/12/2016  ? 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 Seizure disorder ([place]) 04/27/2016  ? Bipolar disorder ([place]) 04/27/2016  ? Iron deficiency anemia 12/27/2013  ? Abnormal Pap smear of cervix 10/12/2013  ? ? ?Past Surgical History:  ?Procedure Laterality Date  ? CESAREAN SECTION N/A 09/08/2016  ? Procedure: CESAREAN SECTION;  Surgeon: Donnamae Jude, MD;  Location: [facility];  Service: Obstetrics;  Laterality: N/A;  ? NO PAST SURGERIES    ? ? ?OB History   ? ? Gravida  ?4  ? Para  ?4  ? Term  ?4   ? Preterm  ?   ? AB  ?   ? Living  ?4  ?  ? ? SAB  ?   ? IAB  ?   ? Ectopic  ?   ? Multiple  ?0  ? Live Births  ?4  ?   ?  ?  ? ? ? ?Home Medications   ? ?Prior to Admission medications   ?Medication Sig Start Date End Date Taking? Authorizing Provider  ?acetaminophen (TYLENOL) 500 MG tablet Take 500 mg by mouth every 6 (six) hours as needed.    [provider]  ? ? ?Family History ?Family History  ?Problem Relation Age of Onset  ? Seizures Father   ? Alcohol abuse Neg Hx   ? Arthritis Neg Hx   ? Asthma Neg Hx   ? Birth defects Neg Hx   ? Cancer Neg Hx   ? COPD Neg Hx   ? Depression Neg Hx   ? Diabetes Neg Hx   ? Drug abuse Neg Hx   ? Early death Neg Hx   ? Hearing loss Neg Hx   ? Heart disease Neg Hx   ?  Hyperlipidemia Neg Hx   ? Hypertension Neg Hx   ? Kidney disease Neg Hx   ? Learning disabilities Neg Hx   ? Mental illness Neg Hx   ? Mental retardation Neg Hx   ? Miscarriages / Stillbirths Neg Hx   ? Stroke Neg Hx   ? Vision loss Neg Hx   ? ? ?Social History ?Social History  ? ?Tobacco Use  ? Smoking status: Former  ?  Packs/day: 0.50  ?  Years: 12.00  ?  Pack years: 6.00  ?  Types: Cigarettes  ?  Quit date: 04/02/2016  ?  Years since quitting: 5.8  ? Smokeless tobacco: Never  ?Vaping Use  ? Vaping Use: Never used  ?Substance Use Topics  ? Alcohol use: Yes  ?  Comment: occasionally  ? Drug use: No  ? ? ? ?Allergies   ?Patient has no known allergies. ? ? ?Review of Systems ?Review of Systems  ?Constitutional:  Negative for chills and fever.  ?Musculoskeletal:  Positive for arthralgias (left foot) and gait problem.  ?     + dorsal foot swelling, bruising  ?Skin:  Positive for color change. Negative for rash and wound.  ? ? ?Physical Exam ?Triage Vital Signs ?ED Triage Vitals  ?Enc Vitals Group  ?   BP 02/09/22 0939 118/64  ?   Pulse Rate 02/09/22 0939 86  ?   Resp 02/09/22 0939 18  ?   Temp 02/09/22 0939 98.2 ?F (36.8 ?C)  ?   Temp Source 02/09/22 0939 Oral  ?   SpO2 02/09/22 0939 96 %  ?   Weight --   ?    Height --   ?   Head Circumference --   ?   Peak Flow --   ?   Pain Score 02/09/22 0940 8  ?   Pain Loc --   ?   Pain Edu? --   ?   Excl. in [place]? --   ? ?No data found. ? ?Updated Vital Signs ?BP 118/64 (BP Location: Left Arm)   Pulse 86   Temp 98.2 ?F (36.8 ?C) (Oral)   Resp 18   LMP 01/19/2022   SpO2 96%  ? ?Physical Exam ?Gen: Well-appearing, in no acute distress; non-toxic ?CV: Regular Rate. Well-perfused. Warm.  ?Resp: Breathing unlabored on room air; no wheezing. ?Psych: Fluid speech in conversation; appropriate affect; normal thought process ?Neuro: Sensation intact throughout. No gross coordination deficits.  ?MSK:  ?- Left foot: Inspection yields an area of soft tissue swelling on the dorsum of the proximal midfoot.  There is no visible erythema, very trivial ecchymosis of the dorsum of the foot.  Insert arch grossly intact.  There is no bony TTP over either malleoli.  There is some TTP over the tarsal metatarsal joint region and dorsal metatarsals of 2-4.  Minimal pain with inversion/eversion testing.  Patient is able to actively plantarflex and dorsiflex ankle, albeit with some pain.  Antalgic gait.  Able to wiggle all 5 toes without difficulty.  Cap refill less than 2 seconds.  Neurovascular intact distally.  Negative anterior drawer.  Negative tib-fib squeeze test ? ? ?UC Treatments / Results  ?Labs ?(all labs ordered are listed, but only abnormal results are displayed) ?Labs Reviewed - No data to display ? ?EKG ? ? ?Radiology ?DG Foot Complete Left ? ?Result Date: 02/09/2022 ?CLINICAL DATA:  Dorsal foot pain. EXAM: LEFT FOOT - COMPLETE 3+ VIEW COMPARISON:  None. FINDINGS: No acute fracture or dislocation is  identified. Joint space widths are preserved. Dorsal midfoot soft tissue swelling is noted. IMPRESSION: Soft tissue swelling without acute osseous abnormality identified. Electronically Signed   By: Logan Bores M.D.   On: 02/09/2022 10:26   ? ?Procedures ?Procedures (including critical care  time) ? ?Medications Ordered in UC ?Medications - No data to display ? ?Initial Impression / Assessment and Plan / UC Course  ?I have reviewed the triage vital signs and the nursing notes. ? ?Pertinent labs & imaging results that were available during my care of the patient were reviewed by me and considered in my medical decision making (see chart for details). ? ?  ? ?Patient presents with contusion on the dorsum of the left midfoot.  X-rays did not demonstrate any acute fracture or bony abnormality.  Exam seems most indicative of foot contusion.  Did apply 3 inch Ace wrap for compression.  She is to use this or tight socks to help control the swelling.  Recommend ice and elevation multiple times daily.  She may use over-the-counter ibuprofen or Aleve to help with pain control and swelling.  Would expect this to get better over the next week or so, she may follow-up with orthopedics if desires or pain not improving over the next few days.  Return precautions provided. ?Final Clinical Impressions(s) / UC Diagnoses  ? ?Final diagnoses:  ?Contusion of left foot, initial encounter  ?Localized swelling of left foot  ? ? ? ?Discharge Instructions   ? ?  ?Ice the top of the foot for 20 minutes at least 3 times a day for the next few days ? ?Compression wrap or tight socks over the foot to help with swelling ? ?You may use ibuprofen or Aleve to help with pain control and reducing swelling ? ?If symptoms or not improving over the next 2 days, recommend following up with orthopedics ? ? ? ? ?ED Prescriptions   ?None ?  ? ?PDMP not reviewed this encounter. ?  Elba Barman, DO ?02/09/22 1108 ? ?

## 2022-02-09 NOTE — ED Triage Notes (Signed)
Pt presents with left foot pain since yesterday after accidentally hitting it against her sons leg. ?

## 2022-05-11 ENCOUNTER — Ambulatory Visit (HOSPITAL_COMMUNITY)
Admission: EM | Admit: 2022-05-11 | Discharge: 2022-05-11 | Disposition: A | Discharge status: Home / Self Care | Payer: Medicaid Other | Attending: Urgent Care | Admitting: Urgent Care

## 2022-05-11 ENCOUNTER — Other Ambulatory Visit: Payer: Self-pay

## 2022-05-11 ENCOUNTER — Encounter (HOSPITAL_COMMUNITY): Payer: Self-pay | Admitting: *Deleted

## 2022-05-11 DIAGNOSIS — H60311 Diffuse otitis externa, right ear: Secondary | ICD-10-CM

## 2022-05-11 MED ORDER — CIPRODEX 0.3-0.1 % OT SUSP: 4.0000 [drp] | Freq: Two times a day (BID) | OTIC | 0 refills | Status: AC

## 2022-05-11 NOTE — ED Triage Notes (Signed)
PT reports she has had water in her RT ear since July 4th after going [place]. Pt has tried OTC but they have not worked .

## 2022-05-11 NOTE — Discharge Instructions (Signed)
Your ear pain is due to otitis externa, which is an infection of the ear canal. ?Avoid using Q-tips or anything inside the ear. ?Clean off your ear buds extensively with antiseptic wipes, and avoid placing back in the ear until fully treated. ?Do not go swimming or submerge head in water x1 week. ?Place the eardrops in the affected ear, 4 drops twice daily for 1 week ?Follow-up with PCP should symptoms persist or worsen  ?

## 2022-05-11 NOTE — ED Provider Notes (Signed)
[facility]    CSN: [id number] Arrival date & time: 05/11/22  1143      History   Chief Complaint Chief Complaint  Patient presents with   Otalgia    HPI PARISSA CHIAO is a 31 y.o. female.    Otalgia Location:  Right Behind ear:  No abnormality Quality:  Sharp and throbbing Severity:  Moderate Onset quality:  Gradual Duration:  8 days Timing:  Constant Progression:  Worsening Chronicity:  New Context: water in ear   Relieved by:  Nothing Worsened by:  Palpation Associated symptoms: ear discharge   Associated symptoms: no congestion, no cough, no fever, no headaches, no neck pain, no rash, no rhinorrhea and no sore throat     Past Medical History:  Diagnosis Date   Anemia    Anxiety    Bipolar 1 disorder (HCC)    Gonorrhea    Headache    tylenol prn   HSV (herpes simplex virus) infection    postpartum depression 2007   Preeclampsia 2007   2007 with first pregnancy only -no problems since   Pregnancy    7 months   Seizure (HCC)    last one at age 48 yrs old - none since   SVD (spontaneous vaginal delivery)    x 3    Patient Active Problem List   Diagnosis Date Noted   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   Trichomoniasis 07/12/2016   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   Seizure disorder (HCC) 04/27/2016   Bipolar disorder (HCC) 04/27/2016   Iron deficiency anemia 12/27/2013   Abnormal Pap smear of cervix 10/12/2013    Past Surgical History:  Procedure Laterality Date   CESAREAN SECTION N/A 09/08/2016   Procedure: CESAREAN SECTION;  Surgeon: Reva Bores, MD;  Location: [facility];  Service: Obstetrics;  Laterality: N/A;   NO PAST SURGERIES      OB History     Gravida  4   Para  4   Term  4   Preterm      AB      Living  4      SAB      IAB      Ectopic      Multiple  0   Live Births  4            Home Medications    Prior to Admission medications   Medication Sig Start Date  End Date Taking? Authorizing Provider  CIPRODEX OTIC suspension Place 4 drops into the right ear 2 (two) times daily for 7 days. 05/11/22 05/18/22 Yes Anuj Summons L, PA  acetaminophen (TYLENOL) 500 MG tablet Take 500 mg by mouth every 6 (six) hours as needed.    [provider]    Family History Family History  Problem Relation Age of Onset   Seizures Father    Alcohol abuse Neg Hx    Arthritis Neg Hx    Asthma Neg Hx    Birth defects Neg Hx    Cancer Neg Hx    COPD Neg Hx    Depression Neg Hx    Diabetes Neg Hx    Drug abuse Neg Hx    Early death Neg Hx    Hearing loss Neg Hx    Heart disease Neg Hx    Hyperlipidemia Neg Hx    Hypertension Neg Hx    Kidney disease Neg Hx    Learning disabilities Neg Hx  Mental illness Neg Hx    Mental retardation Neg Hx    Miscarriages / Stillbirths Neg Hx    Stroke Neg Hx    Vision loss Neg Hx     Social History Social History   Tobacco Use   Smoking status: Former    Packs/day: 0.50    Years: 12.00    Total pack years: 6.00    Types: Cigarettes    Quit date: 04/02/2016    Years since quitting: 6.1   Smokeless tobacco: Never  Vaping Use   Vaping Use: Never used  Substance Use Topics   Alcohol use: Yes    Comment: occasionally   Drug use: No     Allergies   Patient has no known allergies.   Review of Systems Review of Systems  Constitutional:  Negative for fever.  HENT:  Positive for ear discharge and ear pain. Negative for congestion, rhinorrhea and sore throat.   Respiratory:  Negative for cough.   Musculoskeletal:  Negative for neck pain.  Skin:  Negative for rash.  Neurological:  Negative for headaches.     Physical Exam Triage Vital Signs ED Triage Vitals  Enc Vitals Group     BP 05/11/22 1329 120/78     Pulse Rate 05/11/22 1329 74     Resp 05/11/22 1329 18     Temp 05/11/22 1329 99 F (37.2 C)     Temp src --      SpO2 05/11/22 1329 100 %     Weight --      Height --      Head  Circumference --      Peak Flow --      Pain Score 05/11/22 1327 8     Pain Loc --      Pain Edu? --      Excl. in GC? --    No data found.  Updated Vital Signs BP 120/78   Pulse 74   Temp 99 F (37.2 C)   Resp 18   LMP 03/31/2022 (Approximate)   SpO2 100%   Visual Acuity Right Eye Distance:   Left Eye Distance:   Bilateral Distance:    Right Eye Near:   Left Eye Near:    Bilateral Near:     Physical Exam Vitals and nursing note reviewed. Exam conducted with a chaperone present.  Constitutional:      General: She is not in acute distress.    Appearance: Normal appearance. She is well-developed and normal weight. She is not ill-appearing, toxic-appearing or diaphoretic.  HENT:     Head: Normocephalic and atraumatic.     Right Ear: Tympanic membrane and external ear normal. Drainage, swelling and tenderness present. There is no impacted cerumen. Tympanic membrane is not perforated or erythematous.     Left Ear: Tympanic membrane, ear canal and external ear normal. There is no impacted cerumen.     Nose: Nose normal. No congestion or rhinorrhea.  Eyes:     Conjunctiva/sclera: Conjunctivae normal.  Cardiovascular:     Rate and Rhythm: Normal rate and regular rhythm.     Heart sounds: No murmur heard. Pulmonary:     Effort: Pulmonary effort is normal. No respiratory distress.     Breath sounds: Normal breath sounds.  Abdominal:     Palpations: Abdomen is soft.     Tenderness: There is no abdominal tenderness.  Musculoskeletal:        General: No swelling.     Cervical back: Neck  supple.  Skin:    General: Skin is warm and dry.     Capillary Refill: Capillary refill takes less than 2 seconds.  Neurological:     Mental Status: She is alert.  Psychiatric:        Mood and Affect: Mood normal.      UC Treatments / Results  Labs (all labs ordered are listed, but only abnormal results are displayed) Labs Reviewed - No data to display  EKG   Radiology No  results found.  Procedures Procedures (including critical care time)  Medications Ordered in UC Medications - No data to display  Initial Impression / Assessment and Plan / UC Course  I have reviewed the triage vital signs and the nursing notes.  Pertinent labs & imaging results that were available during my care of the patient were reviewed by me and considered in my medical decision making (see chart for details).     R OE - secondary to recent water park exposure. Topical ciprodex drops, 4 drops BID x 7 days. Additional instructions as outlined below.  RTC precautions reviewed  Final Clinical Impressions(s) / UC Diagnoses   Final diagnoses:  Acute diffuse otitis externa of right ear     Discharge Instructions      Your ear pain is due to otitis externa, which is an infection of the ear canal. Avoid using Q-tips or anything inside the ear. Clean off your ear buds extensively with antiseptic wipes, and avoid placing back in the ear until fully treated. Do not go swimming or submerge head in water x1 week. Place the eardrops in the affected ear, 4 drops twice daily for 1 week Follow-up with PCP should symptoms persist or worsen      ED Prescriptions     Medication Sig Dispense Auth. Provider   CIPRODEX OTIC suspension Place 4 drops into the right ear 2 (two) times daily for 7 days. 7.5 mL Tiphany Fayson L, PA      PDMP not reviewed this encounter.   Chaney Malling, [place] 05/11/22 2059

## 2022-12-05 ENCOUNTER — Emergency Department (HOSPITAL_COMMUNITY)
Admission: EM | Admit: 2022-12-05 | Discharge: 2022-12-05 | Disposition: A | Discharge status: Home / Self Care | Payer: Medicaid Other | Attending: Emergency Medicine | Admitting: Emergency Medicine

## 2022-12-05 ENCOUNTER — Emergency Department (HOSPITAL_COMMUNITY): Payer: Medicaid Other

## 2022-12-05 ENCOUNTER — Encounter (HOSPITAL_COMMUNITY): Payer: Self-pay

## 2022-12-05 DIAGNOSIS — Z1152 Encounter for screening for COVID-19: Secondary | ICD-10-CM | POA: Diagnosis not present

## 2022-12-05 DIAGNOSIS — R079 Chest pain, unspecified: Secondary | ICD-10-CM | POA: Insufficient documentation

## 2022-12-05 DIAGNOSIS — J111 Influenza due to unidentified influenza virus with other respiratory manifestations: Secondary | ICD-10-CM

## 2022-12-05 DIAGNOSIS — J101 Influenza due to other identified influenza virus with other respiratory manifestations: Secondary | ICD-10-CM | POA: Diagnosis not present

## 2022-12-05 DIAGNOSIS — R0602 Shortness of breath: Secondary | ICD-10-CM | POA: Diagnosis not present

## 2022-12-05 DIAGNOSIS — Z8616 Personal history of COVID-19: Secondary | ICD-10-CM | POA: Insufficient documentation

## 2022-12-05 DIAGNOSIS — M791 Myalgia, unspecified site: Secondary | ICD-10-CM | POA: Diagnosis present

## 2022-12-05 LAB — URINALYSIS, ROUTINE W REFLEX MICROSCOPIC
Bacteria, UA: NONE SEEN
Bilirubin Urine: NEGATIVE
Glucose, UA: NEGATIVE mg/dL
Ketones, ur: NEGATIVE mg/dL
Nitrite: NEGATIVE
Protein, ur: NEGATIVE mg/dL
Specific Gravity, Urine: 1.02 (ref 1.005–1.030)
pH: 5 (ref 5.0–8.0)

## 2022-12-05 LAB — COMPREHENSIVE METABOLIC PANEL
ALT: 19 U/L (ref 0–44)
AST: 17 U/L (ref 15–41)
Albumin: 3.8 g/dL (ref 3.5–5.0)
Alkaline Phosphatase: 48 U/L (ref 38–126)
Anion gap: 10 (ref 5–15)
BUN: 5 mg/dL — ABNORMAL LOW (ref 6–20)
CO2: 27 mmol/L (ref 22–32)
Calcium: 9.3 mg/dL (ref 8.9–10.3)
Chloride: 102 mmol/L (ref 98–111)
Creatinine, Ser: 0.87 mg/dL (ref 0.44–1.00)
GFR, Estimated: >60 mL/min (ref >60)
Glucose, Bld: 121 mg/dL — ABNORMAL HIGH (ref 70–99)
Potassium: 4.3 mmol/L (ref 3.5–5.1)
Sodium: 139 mmol/L (ref 135–145)
Total Bilirubin: 0.5 mg/dL (ref 0.3–1.2)
Total Protein: 7.5 g/dL (ref 6.5–8.1)

## 2022-12-05 LAB — CBC WITH DIFFERENTIAL/PLATELET
Abs Immature Granulocytes: 0 10*3/uL (ref 0.00–0.07)
Basophils Absolute: 0 10*3/uL (ref 0.0–0.1)
Basophils Relative: 1 %
Eosinophils Absolute: 0.1 10*3/uL (ref 0.0–0.5)
Eosinophils Relative: 4 %
HCT: 42.6 % (ref 36.0–46.0)
Hemoglobin: 13.4 g/dL (ref 12.0–15.0)
Lymphocytes Relative: 65 %
Lymphs Abs: 2.3 10*3/uL (ref 0.7–4.0)
MCH: 27 pg (ref 26.0–34.0)
MCHC: 31.5 g/dL (ref 30.0–36.0)
MCV: 85.7 fL (ref 80.0–100.0)
Monocytes Absolute: 0.3 10*3/uL (ref 0.1–1.0)
Monocytes Relative: 8 %
Neutro Abs: 0.8 10*3/uL — ABNORMAL LOW (ref 1.7–7.7)
Neutrophils Relative %: 22 %
Platelets: 266 10*3/uL (ref 150–400)
RBC: 4.97 MIL/uL (ref 3.87–5.11)
RDW: 13.7 % (ref 11.5–15.5)
WBC: 3.6 10*3/uL — ABNORMAL LOW (ref 4.0–10.5)
nRBC: 0 % (ref 0.0–0.2)
nRBC: 1 /100 WBC — ABNORMAL HIGH

## 2022-12-05 LAB — RESP PANEL BY RT-PCR (RSV, FLU A&B, COVID)  RVPGX2
Influenza A by PCR: NEGATIVE
Influenza B by PCR: POSITIVE — AB
Resp Syncytial Virus by PCR: NEGATIVE
SARS Coronavirus 2 by RT PCR: NEGATIVE

## 2022-12-05 LAB — GROUP A STREP BY PCR: Group A Strep by PCR: NOT DETECTED

## 2022-12-05 LAB — I-STAT BETA HCG BLOOD, ED (MC, WL, AP ONLY): I-stat hCG, quantitative: <5 m[IU]/mL (ref <5)

## 2022-12-05 LAB — LIPASE, BLOOD: Lipase: 52 U/L — ABNORMAL HIGH (ref 11–51)

## 2022-12-05 MED ORDER — ACETAMINOPHEN 325 MG PO TABS
650.0000 mg | ORAL_TABLET | Freq: Once | ORAL | Status: AC
  Administered 2022-12-05: 650 mg via ORAL
  Filled 2022-12-05: qty 2

## 2022-12-05 MED ORDER — OSELTAMIVIR PHOSPHATE 75 MG PO CAPS: 75.0000 mg | ORAL_CAPSULE | Freq: Two times a day (BID) | ORAL | 0 refills | Status: AC

## 2022-12-05 NOTE — ED Provider Notes (Cosign Needed Addendum)
[facility] Provider Note   CSN: [id number] Arrival date & time: 12/05/22  1430     History  No chief complaint on file.  HPI CHARDAE MULKERN is a 32 y.o. female with iron deficiency anemia, seizures, and bipolar disorder presenting for body aches.  Started 5 days ago.  Also endorsing associated sore throat, productive cough which started 3 weeks ago along with congestion but no fever.  Endorses possible sick contacts at work.  Also states she is having some chest discomfort and shortness of breath.  Chest pain is midsternal, reproducible, nonradiating and worse with coughing.  Also nonexertional.  Denies urinary symptoms.  HPI     Home Medications Prior to Admission medications   Medication Sig Start Date End Date Taking? Authorizing Provider  oseltamivir (TAMIFLU) 75 MG capsule Take 1 capsule (75 mg total) by mouth every 12 (twelve) hours. 12/05/22  Yes Harriet Pho, PA-C  acetaminophen (TYLENOL) 500 MG tablet Take 500 mg by mouth every 6 (six) hours as needed.    [provider]      Allergies    Patient has no known allergies.    Review of Systems   Review of Systems  Musculoskeletal:  Positive for myalgias.    Physical Exam Updated Vital Signs BP 119/77 (BP Location: Right Arm)   Pulse 86   Temp 98.6 F (37 C) (Oral)   Resp 16   Ht 5\' 6"  (1.676 m)   Wt 101.2 kg   SpO2 99%   BMI 35.99 kg/m  Physical Exam Vitals and nursing note reviewed.  HENT:     Head: Normocephalic and atraumatic.     Mouth/Throat:     Mouth: Mucous membranes are moist.  Eyes:     General:        Right eye: No discharge.        Left eye: No discharge.     Conjunctiva/sclera: Conjunctivae normal.  Cardiovascular:     Rate and Rhythm: Normal rate and regular rhythm.     Pulses: Normal pulses.     Heart sounds: Normal heart sounds.  Pulmonary:     Effort: Pulmonary effort is normal.     Breath sounds: Normal breath sounds.   Abdominal:     General: Abdomen is flat.     Palpations: Abdomen is soft.  Skin:    General: Skin is warm and dry.  Neurological:     General: No focal deficit present.  Psychiatric:        Mood and Affect: Mood normal.     ED Results / Procedures / Treatments   Labs (all labs ordered are listed, but only abnormal results are displayed) Labs Reviewed  RESP PANEL BY RT-PCR (RSV, FLU A&B, COVID)  RVPGX2 - Abnormal; Notable for the following components:      Result Value   Influenza B by PCR POSITIVE (*)    All other components within normal limits  CBC WITH DIFFERENTIAL/PLATELET - Abnormal; Notable for the following components:   WBC 3.6 (*)    Neutro Abs 0.8 (*)    nRBC 1 (*)    All other components within normal limits  COMPREHENSIVE METABOLIC PANEL - Abnormal; Notable for the following components:   Glucose, Bld 121 (*)    BUN 5 (*)    All other components within normal limits  LIPASE, BLOOD - Abnormal; Notable for the following components:   Lipase 52 (*)    All other  components within normal limits  URINALYSIS, ROUTINE W REFLEX MICROSCOPIC - Abnormal; Notable for the following components:   APPearance CLOUDY (*)    Hgb urine dipstick SMALL (*)    Leukocytes,Ua SMALL (*)    All other components within normal limits  GROUP A STREP BY PCR  PATHOLOGIST SMEAR REVIEW  I-STAT BETA HCG BLOOD, ED ([facility], [facility], [facility] ONLY)    EKG None  Radiology DG Chest 1 View  Result Date: 12/05/2022 CLINICAL DATA:  Cough generalized body aches chest pain. EXAM: CHEST  1 VIEW COMPARISON:  Chest radiograph dated July 17, 2018 FINDINGS: The heart size and mediastinal contours are within normal limits. Both lungs are clear. The visualized skeletal structures are unremarkable. IMPRESSION: No active disease. Electronically Signed   By: Keane Police D.O.   On: 12/05/2022 15:47    Procedures Procedures    Medications Ordered in ED Medications  acetaminophen (TYLENOL) tablet 650 mg (has no  administration in time range)    ED Course/ Medical Decision Making/ A&P Clinical Course as of 12/05/22 1655  Sat Dec 05, 2022  1634 Influenza B By PCR(!): POSITIVE [JR]    Clinical Course User Index [JR] Harriet Pho, PA-C                             Medical Decision Making Amount and/or Complexity of Data Reviewed Labs:  Decision-making details documented in ED Course.  Risk OTC drugs. Prescription drug management.   32 year old female who is well-appearing and hemodynamically stable presenting for cough congestion, sore throat and bodyaches also chest pain shortness of breath.  Physical exam is unremarkable. DDx includes URI, ACS, electrolyte derangement, and UTI.  I personally reviewed and interpreted EKG which revealed normal sinus rhythm.  I personally reviewed and interpreted her labs which did reveal elevated lipase, hematuria, pyuria, and patient is flu positive.  Symptoms are most consistent with her ongoing flu infection.  Sent Tamiflu to her pharmacy.  Treated with Tylenol.  Advised that she continue conservative treatment at home.  Considered UTI but unlikely given no urinary symptoms.  Did advise her of the UA findings.  Also considered ACS but unlikely given no risk factors and chest pain is reproducible, nonradiating and nonexertional and worse with coughing and EKG was reassuring.  Chest pain likely related to her persistent coughing secondary to her flu infection.  Discussed return precautions.        Final Clinical Impression(s) / ED Diagnoses Final diagnoses:  Influenza    Rx / DC Orders ED Discharge Orders          Ordered    oseltamivir (TAMIFLU) 75 MG capsule  Every 12 hours        12/05/22 1655                Harriet Pho, PA-C 12/05/22 1702    Lajean Saver, MD 12/07/22 1538

## 2022-12-05 NOTE — Discharge Instructions (Signed)
Evaluation today revealed that you do have the flu.  Your symptoms are consistent with this diagnosis.  I sent Tamiflu to your pharmacy for treatment.  Also recommend that you get some rest and most importantly good hydration at home.  If you have new shortness of breath or worsening chest pain or uncontrolled fever or any other concerning symptom please return to the emergency department for further evaluation.

## 2022-12-05 NOTE — ED Triage Notes (Signed)
Pt c/o productive cough, sore throat, x 3 weeks; vomiting , generalized body aches, CP and dizziness x 3 days; endorses known sick contacts

## 2022-12-05 NOTE — ED Provider Triage Note (Signed)
Emergency Medicine Provider Triage Evaluation Note  Deanna Murray , a 32 y.o. female  was evaluated in triage.  Pt complains of cough x 3 weeks.  Patient endorses a sore throat. also admits to nausea and vomiting for the past 3 days..  Review of Systems  Positive: N/V Negative: CP  Physical Exam  BP 119/77 (BP Location: Right Arm)   Pulse 86   Temp 98.6 F (37 C) (Oral)   Resp 16   Ht 5\' 6"  (1.676 m)   Wt 101.2 kg   SpO2 99%   BMI 35.99 kg/m  Gen:   Awake, no distress   Resp:  Normal effort  MSK:   Moves extremities without difficulty  Other:    Medical Decision Making  Medically screening exam initiated at 3:11 PM.  Appropriate orders placed.  ELON LOMELI was informed that the remainder of the evaluation will be completed by another provider, this initial triage assessment does not replace that evaluation, and the importance of remaining in the ED until their evaluation is complete.  Labs CXR RVP   Suzy Bouchard, [place] 12/05/22 1512

## 2022-12-09 LAB — PATHOLOGIST SMEAR REVIEW

## 2023-02-10 ENCOUNTER — Ambulatory Visit (HOSPITAL_COMMUNITY)
Admission: EM | Admit: 2023-02-10 | Discharge: 2023-02-10 | Disposition: A | Discharge status: Home / Self Care | Payer: Medicaid Other | Attending: Emergency Medicine | Admitting: Emergency Medicine

## 2023-02-10 ENCOUNTER — Encounter (HOSPITAL_COMMUNITY): Payer: Self-pay

## 2023-02-10 DIAGNOSIS — H6011 Cellulitis of right external ear: Secondary | ICD-10-CM

## 2023-02-10 MED ORDER — AMOXICILLIN-POT CLAVULANATE 875-125 MG PO TABS: 1.0000 | ORAL_TABLET | Freq: Two times a day (BID) | ORAL | 0 refills | Status: AC

## 2023-02-10 MED ORDER — IBUPROFEN 800 MG PO TABS: 800.0000 mg | ORAL_TABLET | Freq: Three times a day (TID) | ORAL | 0 refills | Status: AC

## 2023-02-10 NOTE — ED Provider Notes (Signed)
[facility]    CSN: [id number] Arrival date & time: 02/10/23  1856      History   Chief Complaint Chief Complaint  Patient presents with   Otalgia   Facial Pain    HPI Deanna Murray is a 32 y.o. female.   Patient presents to clinic for right-sided external ear pain that started Sunday.  Initially was a little bump and it got worse.  Today she presents with right tragus swelling and tenderness.  Last took Tylenol earlier today.  She did have an external ear infection of her right ear about a year prior.   Denies fever, recent illness, SOB, or trouble swallowing.     The history is provided by the patient and medical records.  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   Trichomoniasis 07/12/2016   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   Seizure disorder 04/27/2016   Bipolar disorder 04/27/2016   Iron deficiency anemia 12/27/2013   Abnormal Pap smear of cervix 10/12/2013    Past Surgical History:  Procedure Laterality Date   CESAREAN SECTION N/A 09/08/2016   Procedure: CESAREAN SECTION;  Surgeon: Reva Bores, MD;  Location: [facility];  Service: Obstetrics;  Laterality: N/A;   NO PAST SURGERIES      OB History     Gravida  4   Para  4   Term  4   Preterm      AB      Living  4      SAB      IAB      Ectopic      Multiple  0   Live Births  4            Home  Medications    Prior to Admission medications   Medication Sig Start Date End Date Taking? Authorizing Provider  amoxicillin-clavulanate (AUGMENTIN) 875-125 MG tablet Take 1 tablet by mouth every 12 (twelve) hours. 02/10/23  Yes Rinaldo Ratel, [place] N, FNP  ibuprofen (ADVIL) 800 MG tablet Take 1 tablet (800 mg total) by mouth 3 (three) times daily. 02/10/23  Yes Rinaldo Ratel, [place] N, FNP  acetaminophen (TYLENOL) 500 MG tablet Take 500 mg by mouth every 6 (six) hours as needed.    [provider]  oseltamivir (TAMIFLU) 75 MG capsule Take 1 capsule (75 mg total) by mouth every 12 (twelve) hours. 12/05/22   Gareth Eagle, PA-C    Family History Family History  Problem Relation Age of Onset   Seizures Father    Alcohol abuse Neg Hx    Arthritis Neg Hx    Asthma Neg Hx    Birth defects Neg Hx    Cancer Neg Hx    COPD Neg Hx  Depression Neg Hx    Diabetes Neg Hx    Drug abuse Neg Hx    Early death Neg Hx    Hearing loss Neg Hx    Heart disease Neg Hx    Hyperlipidemia Neg Hx    Hypertension Neg Hx    Kidney disease Neg Hx    Learning disabilities Neg Hx    Mental illness Neg Hx    Mental retardation Neg Hx    Miscarriages / Stillbirths Neg Hx    Stroke Neg Hx    Vision loss Neg Hx     Social History Social History   Tobacco Use   Smoking status: Former    Packs/day: 0.50    Years: 12.00    Additional pack years: 0.00    Total pack years: 6.00    Types: Cigarettes    Quit date: 04/02/2016    Years since quitting: 6.8   Smokeless tobacco: Never  Vaping Use   Vaping Use: Never used  Substance Use Topics   Alcohol use: Yes    Comment: occasionally   Drug use: No     Allergies   Patient has no known allergies.   Review of Systems Review of Systems  Constitutional:  Negative for chills, fatigue and fever.  HENT:  Positive for ear discharge, ear pain and facial swelling. Negative for trouble swallowing.   Eyes:  Negative for discharge.  Respiratory:   Negative for choking and shortness of breath.   Cardiovascular:  Negative for chest pain.  Gastrointestinal:  Negative for abdominal pain.  Genitourinary:  Negative for dysuria.     Physical Exam Triage Vital Signs ED Triage Vitals  Enc Vitals Group     BP 02/10/23 1914 110/68     Pulse Rate 02/10/23 1914 96     Resp 02/10/23 1914 16     Temp 02/10/23 1914 98.1 F (36.7 C)     Temp Source 02/10/23 1914 Oral     SpO2 02/10/23 1914 97 %     Weight --      Height --      Head Circumference --      Peak Flow --      Pain Score 02/10/23 1915 9     Pain Loc --      Pain Edu? --      Excl. in GC? --    No data found.  Updated Vital Signs BP 110/68 (BP Location: Left Arm)   Pulse 96   Temp 98.1 F (36.7 C) (Oral)   Resp 16   LMP 02/10/2023   SpO2 97%   Visual Acuity Right Eye Distance:   Left Eye Distance:   Bilateral Distance:    Right Eye Near:   Left Eye Near:    Bilateral Near:     Physical Exam Vitals and nursing note reviewed.  Constitutional:      General: She is not in acute distress.    Appearance: She is well-developed.  HENT:     Head: Normocephalic and atraumatic.     Right Ear: Swelling and tenderness present. There is no impacted cerumen. There is mastoid tenderness.     Left Ear: Tympanic membrane, ear canal and external ear normal.     Ears:      Comments: Swelling, erythema and tenderness to right tragus, exam consistent with cellulitis     Nose: Nose normal.     Mouth/Throat:     Mouth: Mucous membranes are moist.  Pharynx: No posterior oropharyngeal erythema.  Eyes:     General: No scleral icterus.       Right eye: No discharge.        Left eye: No discharge.     Conjunctiva/sclera: Conjunctivae normal.  Cardiovascular:     Rate and Rhythm: Normal rate and regular rhythm.  Pulmonary:     Effort: Pulmonary effort is normal. No respiratory distress.  Musculoskeletal:        General: No swelling. Normal range of motion.     Cervical  back: Neck supple.  Skin:    General: Skin is warm and dry.     Capillary Refill: Capillary refill takes less than 2 seconds.  Neurological:     Mental Status: She is alert.  Psychiatric:        Mood and Affect: Mood normal.        Behavior: Behavior is cooperative.      UC Treatments / Results  Labs (all labs ordered are listed, but only abnormal results are displayed) Labs Reviewed - No data to display  EKG   Radiology No results found.  Procedures Procedures (including critical care time)  Medications Ordered in UC Medications - No data to display  Initial Impression / Assessment and Plan / UC Course  I have reviewed the triage vital signs and the nursing notes.  Pertinent labs & imaging results that were available during my care of the patient were reviewed by me and considered in my medical decision making (see chart for details).  Vitals in triage reviewed, patient is hemodynamically stable.  Exam consistent with cellulitis of her right tragus.  Patient with associated right-sided cervical LAD.  Able to move jaw freely, able to speak in full sentences, no associated oral swelling. Afebrile in clinic and without tachycardia, low concern for systemic illness or sepsis.  Will place on Augmentin for cellulitis coverage with strict emergency room return precautions.  Symptomatic pain management discussed with warm compresses, alternating Tylenol and ibuprofen.  Patient verbalized understanding, no questions at this time.    Final Clinical Impressions(s) / UC Diagnoses   Final diagnoses:  Cellulitis of tragus of right ear     Discharge Instructions      You appear to have a skin infection of the tragus of your right ear.  I am starting you on antibiotics, please take all antibiotics as prescribed until finished.  You can take them with food to help prevent gastrointestinal upset.  You can do warm compresses to help with your pain as well as alternating between  Tylenol and ibuprofen every 4-6 hours.  Please seek immediate care if you develop worsening of swelling in the next 48-72 hours, worsening of facial pain, trouble opening your jaw, high fever despite medication, or any worsening of symptoms, as this may indicate worsening of infection.       ED Prescriptions     Medication Sig Dispense Auth. Provider   amoxicillin-clavulanate (AUGMENTIN) 875-125 MG tablet Take 1 tablet by mouth every 12 (twelve) hours. 14 tablet Rinaldo Ratel, [place] N, [place]   ibuprofen (ADVIL) 800 MG tablet Take 1 tablet (800 mg total) by mouth 3 (three) times daily. 21 tablet Jaeden [place], [place] N, [place]      PDMP not reviewed this encounter.   Kaydon Husby, [place] N, [place] 02/10/23 1946

## 2023-02-10 NOTE — ED Triage Notes (Signed)
Patient states she had "a bump" in her right ear 3 days ago and is now having right ear swelling and right facial swelling. Patient states, "I don't know if I was bitten by an insect or not.  Patient states she has been using warm compresses to the area.

## 2023-02-10 NOTE — Discharge Instructions (Addendum)
You appear to have a skin infection of the tragus of your right ear.  I am starting you on antibiotics, please take all antibiotics as prescribed until finished.  You can take them with food to help prevent gastrointestinal upset.  You can do warm compresses to help with your pain as well as alternating between Tylenol and ibuprofen every 4-6 hours.  Please seek immediate care if you develop worsening of swelling in the next 48-72 hours, worsening of facial pain, trouble opening your jaw, high fever despite medication, or any worsening of symptoms, as this may indicate worsening of infection.

## 2023-08-10 ENCOUNTER — Other Ambulatory Visit: Payer: Self-pay

## 2023-08-10 ENCOUNTER — Emergency Department (HOSPITAL_COMMUNITY)
Admission: EM | Admit: 2023-08-10 | Discharge: 2023-08-11 | Discharge status: Against Medical Advice | Payer: 59 | Attending: Emergency Medicine | Admitting: Emergency Medicine

## 2023-08-10 DIAGNOSIS — R1030 Lower abdominal pain, unspecified: Secondary | ICD-10-CM | POA: Insufficient documentation

## 2023-08-10 DIAGNOSIS — Z5321 Procedure and treatment not carried out due to patient leaving prior to being seen by health care provider: Secondary | ICD-10-CM | POA: Insufficient documentation

## 2023-08-10 DIAGNOSIS — R112 Nausea with vomiting, unspecified: Secondary | ICD-10-CM | POA: Insufficient documentation

## 2023-08-10 LAB — CBC
HCT: 40.8 % (ref 36.0–46.0)
Hemoglobin: 12.9 g/dL (ref 12.0–15.0)
MCH: 26.4 pg (ref 26.0–34.0)
MCHC: 31.6 g/dL (ref 30.0–36.0)
MCV: 83.6 fL (ref 80.0–100.0)
Platelets: 272 10*3/uL (ref 150–400)
RBC: 4.88 MIL/uL (ref 3.87–5.11)
RDW: 12.9 % (ref 11.5–15.5)
WBC: 7.9 10*3/uL (ref 4.0–10.5)
nRBC: 0 % (ref 0.0–0.2)

## 2023-08-10 LAB — COMPREHENSIVE METABOLIC PANEL
ALT: 16 U/L (ref 0–44)
AST: 14 U/L — ABNORMAL LOW (ref 15–41)
Albumin: 3.9 g/dL (ref 3.5–5.0)
Alkaline Phosphatase: 53 U/L (ref 38–126)
Anion gap: 12 (ref 5–15)
BUN: 10 mg/dL (ref 6–20)
CO2: 24 mmol/L (ref 22–32)
Calcium: 9.5 mg/dL (ref 8.9–10.3)
Chloride: 103 mmol/L (ref 98–111)
Creatinine, Ser: 0.81 mg/dL (ref 0.44–1.00)
GFR, Estimated: >60 mL/min (ref >60)
Glucose, Bld: 92 mg/dL (ref 70–99)
Potassium: 3.7 mmol/L (ref 3.5–5.1)
Sodium: 139 mmol/L (ref 135–145)
Total Bilirubin: 0.7 mg/dL (ref 0.3–1.2)
Total Protein: 7.6 g/dL (ref 6.5–8.1)

## 2023-08-10 LAB — URINALYSIS, ROUTINE W REFLEX MICROSCOPIC
Bilirubin Urine: NEGATIVE
Glucose, UA: NEGATIVE mg/dL
Hgb urine dipstick: NEGATIVE
Ketones, ur: NEGATIVE mg/dL
Nitrite: NEGATIVE
Protein, ur: NEGATIVE mg/dL
Specific Gravity, Urine: 1.023 (ref 1.005–1.030)
pH: 5 (ref 5.0–8.0)

## 2023-08-10 LAB — HCG, SERUM, QUALITATIVE: Preg, Serum: NEGATIVE

## 2023-08-10 LAB — LIPASE, BLOOD: Lipase: 37 U/L (ref 11–51)

## 2023-08-10 NOTE — ED Triage Notes (Signed)
Patient reports lower abdominal pain since yesterday. Has associated n/v but denies diarrhea. Unknown LMP-states they are normally irregular.

## 2023-08-11 NOTE — ED Notes (Signed)
Patient called 3x for udated vitals- no answer

## 2023-10-11 IMAGING — DX DG FOOT COMPLETE 3+V*L*
3 series · 3 of 3 positions shown · non-contrast
Comparison: None.

CLINICAL DATA: Dorsal foot pain.

EXAM:
LEFT FOOT - COMPLETE 3+ VIEW

[foot ap]
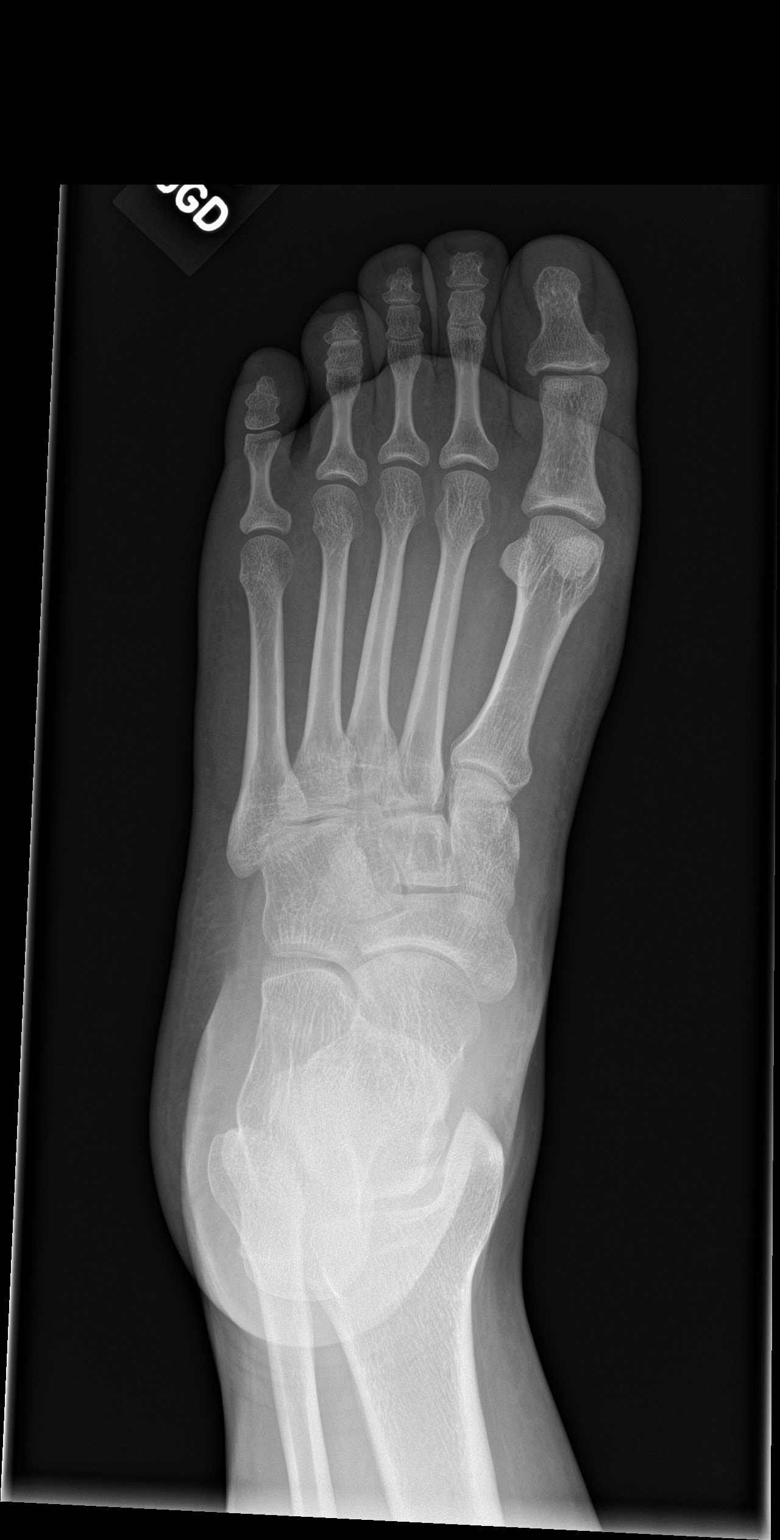

[foot obl]
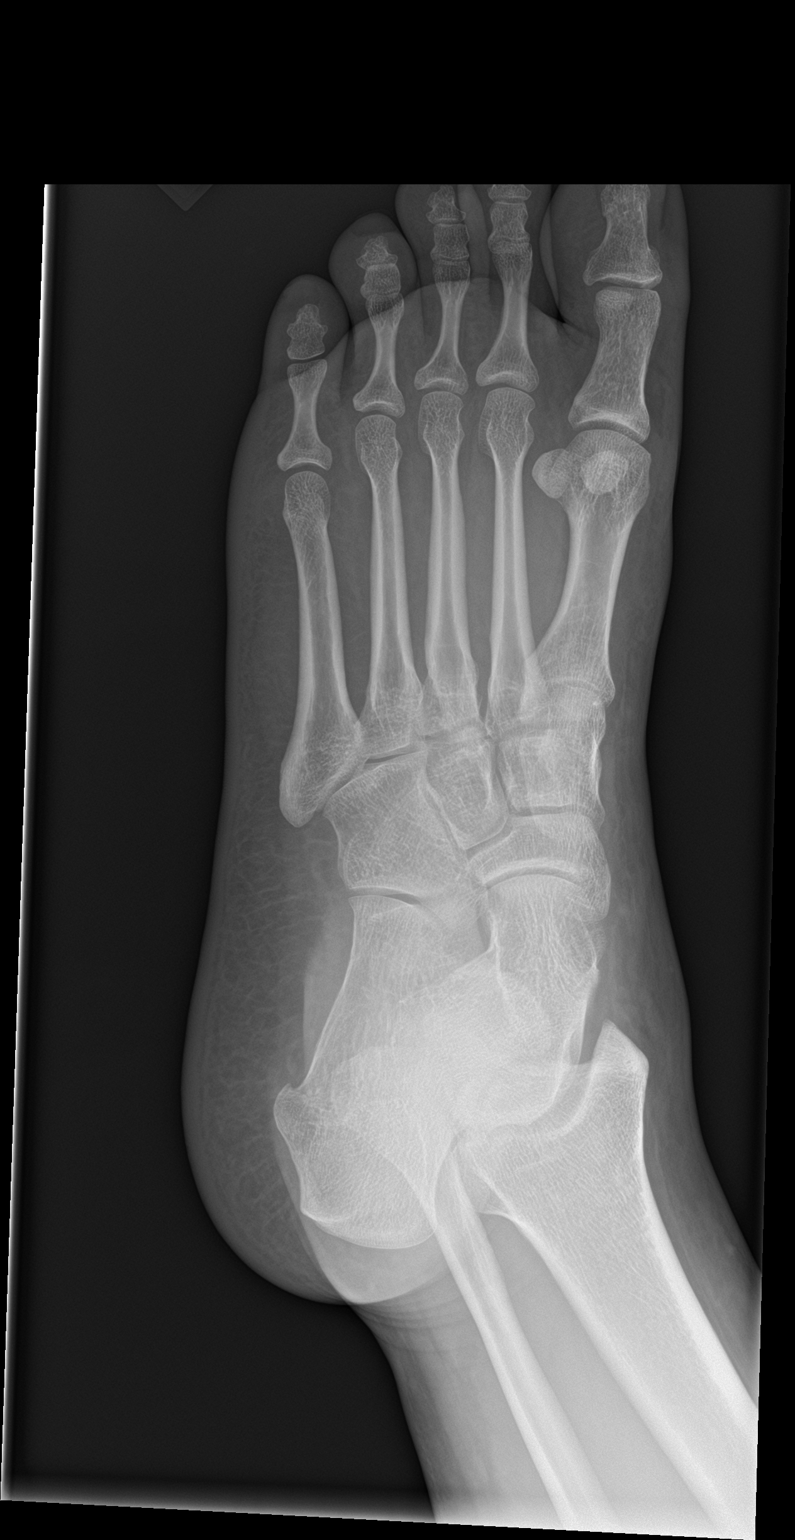

[foot lat]
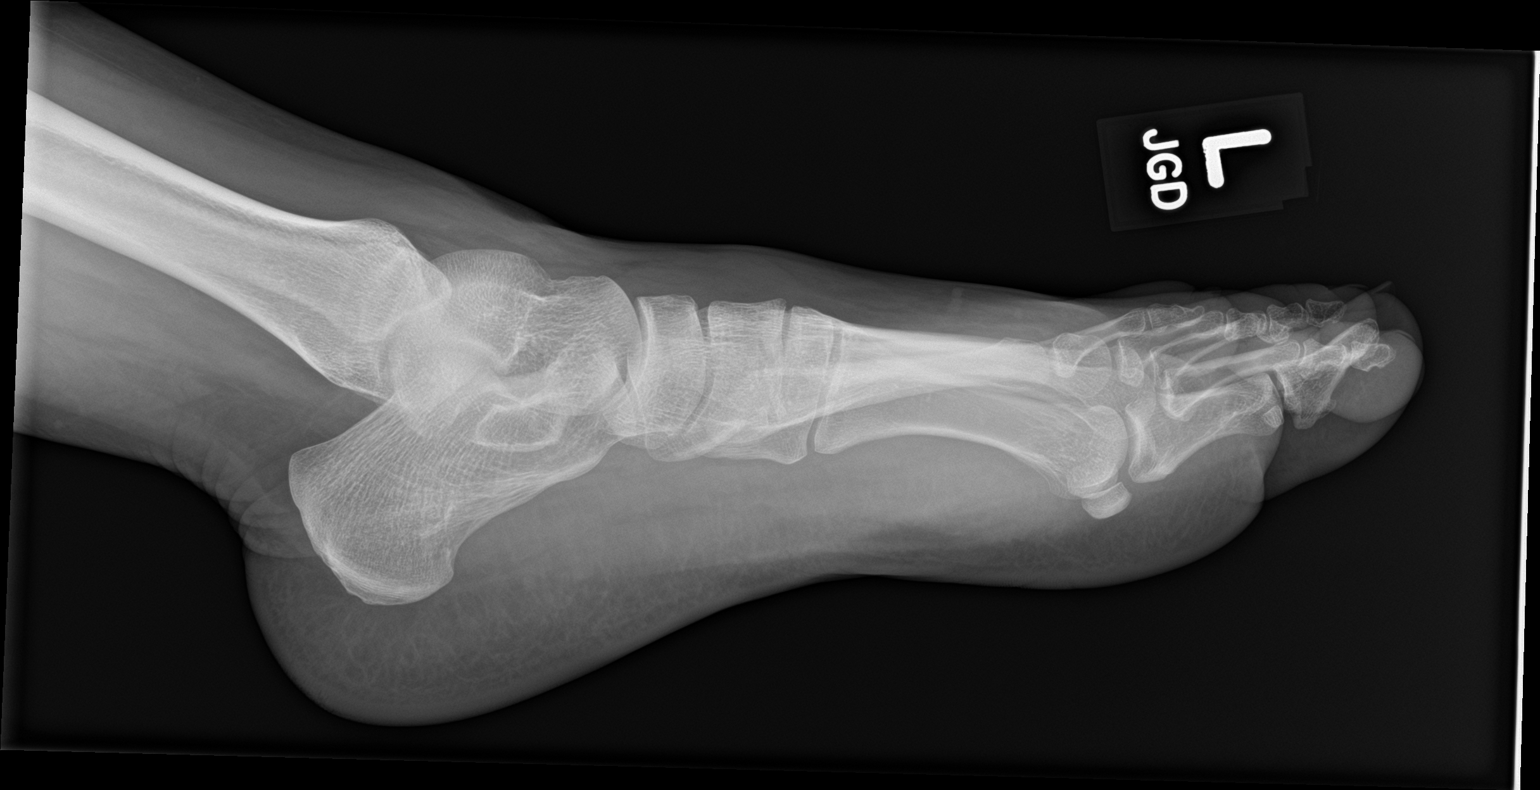

[3 of 3 positions shown; findings below may reference images not displayed]

FINDINGS: No acute fracture or dislocation is identified. Joint space widths
are preserved. Dorsal midfoot soft tissue swelling is noted.
IMPRESSION: Soft tissue swelling without acute osseous abnormality identified.

## 2023-12-01 ENCOUNTER — Emergency Department (HOSPITAL_COMMUNITY)
Admission: EM | Admit: 2023-12-01 | Discharge: 2023-12-01 | Disposition: A | Discharge status: Home / Self Care | Payer: 59 | Attending: Emergency Medicine | Admitting: Emergency Medicine

## 2023-12-01 ENCOUNTER — Other Ambulatory Visit: Payer: Self-pay

## 2023-12-01 ENCOUNTER — Encounter (HOSPITAL_COMMUNITY): Payer: Self-pay

## 2023-12-01 DIAGNOSIS — J020 Streptococcal pharyngitis: Secondary | ICD-10-CM | POA: Diagnosis not present

## 2023-12-01 DIAGNOSIS — Z20822 Contact with and (suspected) exposure to covid-19: Secondary | ICD-10-CM | POA: Diagnosis not present

## 2023-12-01 DIAGNOSIS — R55 Syncope and collapse: Secondary | ICD-10-CM | POA: Diagnosis not present

## 2023-12-01 DIAGNOSIS — J029 Acute pharyngitis, unspecified: Secondary | ICD-10-CM | POA: Diagnosis present

## 2023-12-01 LAB — CBC
HCT: 38.7 % (ref 36.0–46.0)
Hemoglobin: 12.3 g/dL (ref 12.0–15.0)
MCH: 26.5 pg (ref 26.0–34.0)
MCHC: 31.8 g/dL (ref 30.0–36.0)
MCV: 83.4 fL (ref 80.0–100.0)
Platelets: 300 10*3/uL (ref 150–400)
RBC: 4.64 MIL/uL (ref 3.87–5.11)
RDW: 13.6 % (ref 11.5–15.5)
WBC: 9.8 10*3/uL (ref 4.0–10.5)
nRBC: 0 % (ref 0.0–0.2)

## 2023-12-01 LAB — RESP PANEL BY RT-PCR (RSV, FLU A&B, COVID)  RVPGX2
Influenza A by PCR: NEGATIVE
Influenza B by PCR: NEGATIVE
Resp Syncytial Virus by PCR: NEGATIVE
SARS Coronavirus 2 by RT PCR: NEGATIVE

## 2023-12-01 LAB — BASIC METABOLIC PANEL
Anion gap: 12 (ref 5–15)
BUN: 9 mg/dL (ref 6–20)
CO2: 25 mmol/L (ref 22–32)
Calcium: 9.2 mg/dL (ref 8.9–10.3)
Chloride: 100 mmol/L (ref 98–111)
Creatinine, Ser: 0.92 mg/dL (ref 0.44–1.00)
GFR, Estimated: >60 mL/min (ref >60)
Glucose, Bld: 116 mg/dL — ABNORMAL HIGH (ref 70–99)
Potassium: 3.4 mmol/L — ABNORMAL LOW (ref 3.5–5.1)
Sodium: 137 mmol/L (ref 135–145)

## 2023-12-01 LAB — GROUP A STREP BY PCR: Group A Strep by PCR: DETECTED — AB

## 2023-12-01 LAB — TROPONIN I (HIGH SENSITIVITY): Troponin I (High Sensitivity): 3 ng/L (ref <18)

## 2023-12-01 MED ORDER — DEXAMETHASONE 4 MG PO TABS
4.0000 mg | ORAL_TABLET | Freq: Once | ORAL | Status: AC
  Administered 2023-12-01: 4 mg via ORAL
  Filled 2023-12-01: qty 1

## 2023-12-01 MED ORDER — PENICILLIN V POTASSIUM 500 MG PO TABS: 500.0000 mg | ORAL_TABLET | Freq: Three times a day (TID) | ORAL | 0 refills | Status: AC

## 2023-12-01 MED ORDER — PENICILLIN V POTASSIUM 250 MG PO TABS
500.0000 mg | ORAL_TABLET | Freq: Once | ORAL | Status: AC
  Administered 2023-12-01: 500 mg via ORAL
  Filled 2023-12-01: qty 2

## 2023-12-01 NOTE — ED Triage Notes (Signed)
Complains of headache, dizziness, near syncope with body aches fever and sore throat.  Tonsils are swollen and red. Reports her legs get weak if she stands too long.  Started on Monday.

## 2023-12-01 NOTE — ED Provider Notes (Signed)
[place] EMERGENCY DEPARTMENT AT [facility] Provider Note   CSN: [id number] Arrival date & time: 12/01/23  6063     History  Chief Complaint  Patient presents with   URI   Loss of Consciousness    Deanna Murray is a 33 y.o. female.  33 year old female here today with sore throat, body aches and fever.  Patient says that she is felt lightheaded, has nearly passed out but has not.  Has had a difficult time swallowing recently.  Symptoms began 3 days ago.   URI Loss of Consciousness      Home Medications Prior to Admission medications   Medication Sig Start Date End Date Taking? Authorizing Provider  penicillin v potassium (VEETID) 500 MG tablet Take 1 tablet (500 mg total) by mouth 3 (three) times daily for 10 days. 12/01/23 12/11/23 Yes Anders Simmonds T, DO  acetaminophen (TYLENOL) 500 MG tablet Take 500 mg by mouth every 6 (six) hours as needed.    [provider]  amoxicillin-clavulanate (AUGMENTIN) 875-125 MG tablet Take 1 tablet by mouth every 12 (twelve) hours. 02/10/23   Garrison, [place] N, FNP  ibuprofen (ADVIL) 800 MG tablet Take 1 tablet (800 mg total) by mouth 3 (three) times daily. 02/10/23   Garrison, [place] N, FNP  oseltamivir (TAMIFLU) 75 MG capsule Take 1 capsule (75 mg total) by mouth every 12 (twelve) hours. 12/05/22   Gareth Eagle, PA-C      Allergies    Patient has no known allergies.    Review of Systems   Review of Systems  Cardiovascular:  Positive for syncope.    Physical Exam Updated Vital Signs BP 113/79 (BP Location: Right Arm)   Pulse 98   Temp 98.2 F (36.8 C)   Resp 20   Ht 5\' 6"  (1.676 m)   Wt 101.2 kg   SpO2 95%   BMI 35.99 kg/m  Physical Exam Vitals reviewed.  HENT:     Head:     Comments: Bilateral swelling of the tonsils, exudates.  No deviation of the uvula.    Nose: Nose normal.     Mouth/Throat:     Mouth: Mucous membranes are moist.  Eyes:     Pupils: Pupils are equal, round, and reactive  to light.  Cardiovascular:     Rate and Rhythm: Normal rate.  Neurological:     Mental Status: She is alert.     ED Results / Procedures / Treatments   Labs (all labs ordered are listed, but only abnormal results are displayed) Labs Reviewed  GROUP A STREP BY PCR - Abnormal; Notable for the following components:      Result Value   Group A Strep by PCR DETECTED (*)    All other components within normal limits  BASIC METABOLIC PANEL - Abnormal; Notable for the following components:   Potassium 3.4 (*)    Glucose, Bld 116 (*)    All other components within normal limits  RESP PANEL BY RT-PCR (RSV, FLU A&B, COVID)  RVPGX2  CBC  TROPONIN I (HIGH SENSITIVITY)  TROPONIN I (HIGH SENSITIVITY)    EKG EKG Interpretation Date/Time:  Wednesday December 01 2023 08:46:25 EST Ventricular Rate:  96 PR Interval:  144 QRS Duration:  80 QT Interval:  346 QTC Calculation: 437 R Axis:   74  Text Interpretation: Normal sinus rhythm Normal ECG When compared with ECG of 05-Dec-2022 16:57, PREVIOUS ECG IS PRESENT Confirmed by Anders Simmonds [id number]) on 12/01/2023 3:15:19 PM  Radiology No results found.  Procedures Procedures    Medications Ordered in ED Medications  dexamethasone (DECADRON) tablet 4 mg (has no administration in time range)  penicillin v potassium (VEETID) tablet 500 mg (has no administration in time range)    ED Course/ Medical Decision Making/ A&P                                 Medical Decision Making 33 year old female here today for sore throat, aches, feeling lightheaded.  Plan # patient's exam is consistent with viral syndrome versus strep pharyngitis.  Her strep test was positive, patient has not had cough.  Believe this is strep pharyngitis.  Will provide her with some Decadron here, prescription for penicillin sent to her pharmacy.  My independent review the patient's EKG shows no ST segment depressions or elevations, no T wave versions, normal  intervals.  Potassium mildly decreased at 3.4.  Counseled patient on potassium rich foods.  Risk Prescription drug management.           Final Clinical Impression(s) / ED Diagnoses Final diagnoses:  Strep pharyngitis    Rx / DC Orders ED Discharge Orders          Ordered    penicillin v potassium (VEETID) 500 MG tablet  3 times daily        12/01/23 1523              Anders Simmonds T, DO 12/01/23 1523

## 2023-12-01 NOTE — ED Provider Triage Note (Signed)
Emergency Medicine Provider Triage Evaluation Note  Deanna Murray , a 33 y.o. female  was evaluated in triage.  Pt complains of headache, dizziness, sore throat, syncope, body aches, fever x 3 days. Had syncopal episode 3 days ago with associated intense sharp chest pain beforehand. Had more chest pain yesterday and almost passed out again.   Review of Systems  Positive: As above Negative:   Physical Exam  BP 113/79 (BP Location: Right Arm)   Pulse 98   Temp 98.2 F (36.8 C)   Resp 20   Ht 5\' 6"  (1.676 m)   Wt 101.2 kg   SpO2 95%   BMI 35.99 kg/m  Gen:   Awake, no distress   Resp:  Normal effort  MSK:   Moves extremities without difficulty  Other:    Medical Decision Making  Medically screening exam initiated at 8:37 AM.  Appropriate orders placed.  Deanna Murray was informed that the remainder of the evaluation will be completed by another provider, this initial triage assessment does not replace that evaluation, and the importance of remaining in the ED until their evaluation is complete.  Workup initiated   Su Monks, PA-C 12/01/23 1610

## 2023-12-01 NOTE — Discharge Instructions (Addendum)
You tested positive for strep throat.  You can take penicillin 3 times per day for the next 10 days.  You can take Motrin and Tylenol at home for sore throat.  Symptoms should begin to improve over the next 3 days.

## 2024-05-27 ENCOUNTER — Other Ambulatory Visit: Payer: Self-pay

## 2024-05-27 ENCOUNTER — Emergency Department (HOSPITAL_COMMUNITY)
Admission: EM | Admit: 2024-05-27 | Discharge: 2024-05-27 | Disposition: A | Discharge status: Home / Self Care | Payer: MEDICAID | Attending: Emergency Medicine | Admitting: Emergency Medicine

## 2024-05-27 ENCOUNTER — Emergency Department (HOSPITAL_COMMUNITY): Payer: MEDICAID

## 2024-05-27 ENCOUNTER — Encounter (HOSPITAL_COMMUNITY): Payer: Self-pay | Admitting: Emergency Medicine

## 2024-05-27 DIAGNOSIS — R0781 Pleurodynia: Secondary | ICD-10-CM | POA: Diagnosis present

## 2024-05-27 DIAGNOSIS — R112 Nausea with vomiting, unspecified: Secondary | ICD-10-CM | POA: Diagnosis not present

## 2024-05-27 DIAGNOSIS — R079 Chest pain, unspecified: Secondary | ICD-10-CM

## 2024-05-27 LAB — HCG, SERUM, QUALITATIVE: Preg, Serum: NEGATIVE

## 2024-05-27 LAB — BASIC METABOLIC PANEL WITH GFR
Anion gap: 10 (ref 5–15)
BUN: 10 mg/dL (ref 6–20)
CO2: 19 mmol/L — ABNORMAL LOW (ref 22–32)
Calcium: 9 mg/dL (ref 8.9–10.3)
Chloride: 110 mmol/L (ref 98–111)
Creatinine, Ser: 0.83 mg/dL (ref 0.44–1.00)
GFR, Estimated: >60 mL/min (ref >60)
Glucose, Bld: 97 mg/dL (ref 70–99)
Potassium: 3.3 mmol/L — ABNORMAL LOW (ref 3.5–5.1)
Sodium: 139 mmol/L (ref 135–145)

## 2024-05-27 LAB — CBC
HCT: 34.1 % — ABNORMAL LOW (ref 36.0–46.0)
Hemoglobin: 10.9 g/dL — ABNORMAL LOW (ref 12.0–15.0)
MCH: 26 pg (ref 26.0–34.0)
MCHC: 32 g/dL (ref 30.0–36.0)
MCV: 81.4 fL (ref 80.0–100.0)
Platelets: 319 K/uL (ref 150–400)
RBC: 4.19 MIL/uL (ref 3.87–5.11)
RDW: 14.2 % (ref 11.5–15.5)
WBC: 7.4 K/uL (ref 4.0–10.5)
nRBC: 0 % (ref 0.0–0.2)

## 2024-05-27 LAB — TROPONIN I (HIGH SENSITIVITY): Troponin I (High Sensitivity): 3 ng/L (ref <18)

## 2024-05-27 MED ORDER — ONDANSETRON 4 MG PO TBDP: 4.0000 mg | ORAL_TABLET | Freq: Three times a day (TID) | ORAL | 0 refills | Status: AC | PRN

## 2024-05-27 MED ORDER — MIDAZOLAM HCL 2 MG/2ML IJ SOLN
2.0000 mg | Freq: Once | INTRAMUSCULAR | Status: AC
  Administered 2024-05-27: 2 mg via INTRAVENOUS
  Filled 2024-05-27: qty 2

## 2024-05-27 MED ORDER — METOCLOPRAMIDE HCL 5 MG/ML IJ SOLN: 10.0000 mg | Freq: Once | INTRAMUSCULAR | Status: DC

## 2024-05-27 MED ORDER — ONDANSETRON HCL 4 MG/2ML IJ SOLN
4.0000 mg | Freq: Once | INTRAMUSCULAR | Status: AC
  Administered 2024-05-27: 4 mg via INTRAVENOUS
  Filled 2024-05-27: qty 2

## 2024-05-27 MED ORDER — POTASSIUM CHLORIDE CRYS ER 20 MEQ PO TBCR
40.0000 meq | EXTENDED_RELEASE_TABLET | Freq: Once | ORAL | Status: AC
Start: 2024-05-27 — End: 2024-05-27
  Administered 2024-05-27: 40 meq via ORAL
  Filled 2024-05-27: qty 2

## 2024-05-27 MED ORDER — DIPHENHYDRAMINE HCL 50 MG/ML IJ SOLN: 12.5000 mg | Freq: Once | INTRAMUSCULAR | Status: DC

## 2024-05-27 MED ORDER — KETOROLAC TROMETHAMINE 15 MG/ML IJ SOLN
15.0000 mg | Freq: Once | INTRAMUSCULAR | Status: AC
  Administered 2024-05-27: 15 mg via INTRAVENOUS
  Filled 2024-05-27: qty 1

## 2024-05-27 MED ORDER — LORAZEPAM 1 MG PO TABS
1.0000 mg | ORAL_TABLET | Freq: Once | ORAL | Status: AC
  Administered 2024-05-27: 1 mg via ORAL
  Filled 2024-05-27: qty 1

## 2024-05-27 NOTE — ED Notes (Signed)
 After administration of medications, patient began vomiting. EDP notified.

## 2024-05-27 NOTE — ED Notes (Signed)
Graham crackers & ginger ale provided

## 2024-05-27 NOTE — ED Provider Notes (Signed)
 [place] EMERGENCY DEPARTMENT AT [facility] Provider Note   CSN: [id number] Arrival date & time: 05/27/24  1515     Patient presents with: Chest Pain   Deanna Murray is a 33 y.o. female.  {Add pertinent medical, surgical, social history, OB history to HPI:2069} 33 year old female history of anxiety who presents emergency department chest pain.  Says that earlier today at 70 AM getting back to the grocery store and started having chest pain.  Left-sided and sharp.  Pleuritic. Thought it was initially due to her anxiety and was able to calm down with deep breathing exercises but it persisted and her significant other prompted her to call 911.  No diaphoresis or vomiting. No hemoptysis.  No leg swelling.  No history of DVT/PE, cancer, or recent surgery.  No recent trips in the past month.  Not on birth control or hormones.  No history of MI.       Prior to Admission medications   Medication Sig Start Date End Date Taking? Authorizing Provider  acetaminophen  (TYLENOL ) 500 MG tablet Take 500 mg by mouth every 6 (six) hours as needed.    [provider]  amoxicillin -clavulanate (AUGMENTIN ) 875-125 MG tablet Take 1 tablet by mouth every 12 (twelve) hours. 02/10/23   Dreama, [place]  N, FNP  ibuprofen  (ADVIL ) 800 MG tablet Take 1 tablet (800 mg total) by mouth 3 (three) times daily. 02/10/23   Dreama, [place]  N, FNP  oseltamivir  (TAMIFLU ) 75 MG capsule Take 1 capsule (75 mg total) by mouth every 12 (twelve) hours. 12/05/22   Robinson, John K, PA-C    Allergies: Patient has no known allergies.    Review of Systems  Updated Vital Signs BP 123/73 (BP Location: Right Arm)   Pulse 82   Temp 98.3 F (36.8 C)   Resp 18   Ht 5' 6 (1.676 m)   Wt 101 kg   SpO2 100%   BMI 35.94 kg/m   Physical Exam Vitals and nursing note reviewed.  Constitutional:      General: She is not in acute distress.    Appearance: She is well-developed.  HENT:     Head: Normocephalic  and atraumatic.     Right Ear: External ear normal.     Left Ear: External ear normal.     Nose: Nose normal.  Eyes:     Extraocular Movements: Extraocular movements intact.     Conjunctiva/sclera: Conjunctivae normal.     Pupils: Pupils are equal, round, and reactive to light.  Cardiovascular:     Rate and Rhythm: Normal rate and regular rhythm.     Heart sounds: No murmur heard.    Comments: Radial pulses 2+ bilaterally.  Chest pain reproducible Pulmonary:     Effort: Pulmonary effort is normal. No respiratory distress.     Breath sounds: Normal breath sounds.  Musculoskeletal:     Cervical back: Normal range of motion and neck supple.     Right lower leg: No edema.     Left lower leg: No edema.  Skin:    General: Skin is warm and dry.  Neurological:     Mental Status: She is alert and oriented to person, place, and time. Mental status is at baseline.  Psychiatric:        Mood and Affect: Mood normal.     (all labs ordered are listed, but only abnormal results are displayed) Labs Reviewed  CBC - Abnormal; Notable for the following components:  Result Value   Hemoglobin 10.9 (*)    HCT 34.1 (*)    All other components within normal limits  HCG, SERUM, QUALITATIVE  BASIC METABOLIC PANEL WITH GFR  TROPONIN I (HIGH SENSITIVITY)    EKG: EKG Interpretation Date/Time:  Saturday May 27 2024 15:20:53 EDT Ventricular Rate:  77 PR Interval:  144 QRS Duration:  78 QT Interval:  384 QTC Calculation: 434 R Axis:   63  Text Interpretation: Normal sinus rhythm Normal ECG When compared with ECG of 01-Dec-2023 08:46, PREVIOUS ECG IS PRESENT Confirmed by Yolande Charleston [phone]) on 05/27/2024 4:08:03 PM  Radiology: No results found.  {Document cardiac monitor, telemetry assessment procedure when appropriate:32947} Procedures   Medications Ordered in the ED  LORazepam  (ATIVAN ) tablet 1 mg (has no administration in time range)      {Click here for ABCD2, HEART and  other calculators REFRESH Note before signing:1}                              Medical Decision Making Amount and/or Complexity of Data Reviewed Labs: ordered. Radiology: ordered.  Risk Prescription drug management.   ***  {Document critical care time when appropriate  Document review of labs and clinical decision tools ie CHADS2VASC2, etc  Document your independent review of radiology images and any outside records  Document your discussion with family members, caretakers and with consultants  Document social determinants of health affecting pt's care  Document your decision making why or why not admission, treatments were needed:32947:::1}   Final diagnoses:  None    ED Discharge Orders     None

## 2024-05-27 NOTE — ED Notes (Signed)
 Patient endorses nausea after eating/drinking but no episodes of vomiting. EDP notified

## 2024-05-27 NOTE — ED Triage Notes (Signed)
 PT BIB EMS from home sudden onset mid sternal chest pain and radiates to back SOB and nausea. PT has a lot of stress at home and hx of anxiety. Recent death in the family.   6/10 sharp pain  20g left hand 4mg  zofran  324 mg aspirin  1 nitroglycerin  124/60 92 hr Cbg 112 rr20

## 2024-05-27 NOTE — ED Provider Triage Note (Signed)
 Emergency Medicine Provider Triage Evaluation Note  Deanna Murray , a 33 y.o. female  was evaluated in triage.  Pt complains of chest pain.  Sharp and stabbing started earlier today around 17 also has had some shortness of breath.  Radiates to her left side.  She is not on any hormones or birth control.  No history of DVT/PE.  No recent surgeries.  No recent pregnancies.  No personal history of cancer.  No personal history of MI.  Does have a history of anxiety and thinks that it could be related  Review of Systems  Positive: Shortness of breath Negative: Leg swelling  Physical Exam  BP 123/73 (BP Location: Right Arm)   Pulse 82   Temp 98.3 F (36.8 C)   Resp 18   Ht 5' 6 (1.676 m)   Wt 101 kg   SpO2 100%   BMI 35.94 kg/m  Gen:   Awake, no distress   Resp:  Normal effort  MSK:   Moves extremities without difficulty, no lower extremity edema Other:    Medical Decision Making  Medically screening exam initiated at 4:06 PM.  Appropriate orders placed.  Deanna Murray was informed that the remainder of the evaluation will be completed by another provider, this initial triage assessment does not replace that evaluation, and the importance of remaining in the ED until their evaluation is complete.  Presents with chest pain.  Somewhat atypical.  She is not in acute distress on exam.  Appears to be PERC negative.  EKG without STEMI.  Will room when available.   Deanna Lamar BROCKS, MD 05/27/24 [phone]

## 2024-05-27 NOTE — ED Notes (Signed)
 Dc intructions and scripts reviewed with pt no questions or concerns at this time. Will follow up next week. Bus pass provided to pt. Pt ambulated out of ED with steady gait.

## 2024-05-27 NOTE — Discharge Instructions (Signed)
 You were seen for your chest pain in the emergency department.   At home, please take Tylenol  and ibuprofen  for your chest pain.  Take Zofran  for nausea or vomiting.    Follow-up with your primary doctor in 2-3 days regarding your visit.    Return immediately to the emergency department if you experience any of the following: Worsening pain, difficulty breathing, unexplained vomiting or sweating, or any other concerning symptoms.    Thank you for visiting our Emergency Department. It was a pleasure taking care of you today.

## 2024-06-30 ENCOUNTER — Ambulatory Visit (HOSPITAL_COMMUNITY)
Admission: EM | Admit: 2024-06-30 | Discharge: 2024-06-30 | Disposition: A | Discharge status: Home / Self Care | Payer: MEDICAID

## 2024-06-30 ENCOUNTER — Encounter (HOSPITAL_COMMUNITY): Payer: Self-pay | Admitting: Emergency Medicine

## 2024-06-30 DIAGNOSIS — N898 Other specified noninflammatory disorders of vagina: Secondary | ICD-10-CM | POA: Diagnosis present

## 2024-06-30 LAB — POCT URINE PREGNANCY: Preg Test, Ur: NEGATIVE

## 2024-06-30 NOTE — ED Triage Notes (Signed)
 Pt c/o vaginal discharge (white) x's 1 week   Pt is requesting STD test due to her partner telling her that he has been with someone else that had STD

## 2024-06-30 NOTE — Discharge Instructions (Signed)
  1. Vaginal discharge (Primary) - Cervicovaginal swab collected in UC and sent to lab for further testing results should be available in 2 to 3 days via MyChart. -If any abnormal results occur from testing patient will be contacted and appropriate treatment provided. - POCT urine pregnancy complaining UC is negative for pregnancy -Continue to monitor symptoms for any change in severity if there is any escalation of current symptoms or development of new symptoms follow-up in ER for further evaluation and management.

## 2024-06-30 NOTE — ED Provider Notes (Signed)
 [facility]  Note:  This document was prepared using Dragon voice recognition software and may include unintentional dictation errors.  MRN: [id number] DOB: Jun 12, 1991  Subjective:   Deanna Murray is a 33 y.o. female presenting for white vaginal discharge with abnormal fishy odor as reported by patient x 1 week.  Patient reports that her partner stated that he was tested yesterday after having intercourse with someone who may have sexually transmitted disease.  Patient states she would like testing to determine potential causes of vaginal discharge and abnormal vaginal odor.  Patient denies any dysuria, abdominal pain, flank pain, increased urinary frequency, vaginal lesions.  No current facility-administered medications for this encounter.  Current Outpatient Medications:    acetaminophen  (TYLENOL ) 500 MG tablet, Take 500 mg by mouth every 6 (six) hours as needed., Disp: , Rfl:    amoxicillin -clavulanate (AUGMENTIN ) 875-125 MG tablet, Take 1 tablet by mouth every 12 (twelve) hours. (Patient not taking: Reported on 06/30/2024), Disp: 14 tablet, Rfl: 0   ibuprofen  (ADVIL ) 800 MG tablet, Take 1 tablet (800 mg total) by mouth 3 (three) times daily., Disp: 21 tablet, Rfl: 0   ondansetron  (ZOFRAN -ODT) 4 MG disintegrating tablet, Take 1 tablet (4 mg total) by mouth every 8 (eight) hours as needed for nausea or vomiting. (Patient not taking: Reported on 06/30/2024), Disp: 12 tablet, Rfl: 0   oseltamivir  (TAMIFLU ) 75 MG capsule, Take 1 capsule (75 mg total) by mouth every 12 (twelve) hours. (Patient not taking: Reported on 06/30/2024), Disp: 10 capsule, Rfl: 0   No Known Allergies  Past Medical History:  Diagnosis Date   Anemia    Anxiety    Bipolar 1 disorder (HCC)    Gonorrhea    Headache    tylenol  prn   HSV (herpes simplex virus) infection    postpartum depression 2007   Preeclampsia 2007   2007 with first pregnancy only -no problems since   Pregnancy    7 months    Seizure (HCC)    last one at age 39 yrs old - none since   SVD (spontaneous vaginal delivery)    x 3     Past Surgical History:  Procedure Laterality Date   CESAREAN SECTION N/A 09/08/2016   Procedure: CESAREAN SECTION;  Surgeon: Glenys GORMAN Birk, MD;  Location: [facility]IR[facility]ING SU[facility];  Service: Obstetrics;  Laterality: N/A;   NO PAST SURGERIES      Family History  Problem Relation Age of Onset   Seizures Father    Alcohol abuse Neg Hx    Arthritis Neg Hx    Asthma Neg Hx    Birth defects Neg Hx    Cancer Neg Hx    COPD Neg Hx    Depression Neg Hx    Diabetes Neg Hx    Drug abuse Neg Hx    Early death Neg Hx    Hearing loss Neg Hx    Heart disease Neg Hx    Hyperlipidemia Neg Hx    Hypertension Neg Hx    Kidney disease Neg Hx    Learning disabilities Neg Hx    Mental illness Neg Hx    Mental retardation Neg Hx    Miscarriages / Stillbirths Neg Hx    Stroke Neg Hx    Vision loss Neg Hx     Social History   Tobacco Use   Smoking status: Former    Current packs/day: 0.00    Average packs/day: 0.5 packs/day for 12.0 years (6.0 ttl pk-yrs)  Types: Cigarettes    Start date: 04/02/2004    Quit date: 04/02/2016    Years since quitting: 8.2   Smokeless tobacco: Never  Vaping Use   Vaping status: Never Used  Substance Use Topics   Alcohol use: Not Currently    Comment: occasionally   Drug use: Not Currently    Types: Marijuana    Comment: occassionally    ROS Refer to HPI for ROS details.  Objective:   Vitals: BP 107/73 (BP Location: Left Arm)   Pulse 73   Temp 98.3 F (36.8 C) (Oral)   Resp 16   LMP 04/24/2024 (Exact Date)   SpO2 98%   Physical Exam Vitals and nursing note reviewed.  Constitutional:      General: She is not in acute distress.    Appearance: Normal appearance. She is well-developed. She is not ill-appearing or toxic-appearing.  HENT:     Head: Normocephalic and atraumatic.     Mouth/Throat:     Mouth: Mucous membranes are moist.   Cardiovascular:     Rate and Rhythm: Normal rate.  Pulmonary:     Effort: Pulmonary effort is normal. No respiratory distress.  Abdominal:     General: There is no distension.     Palpations: Abdomen is soft.     Tenderness: There is no abdominal tenderness. There is no right CVA tenderness, left CVA tenderness, guarding or rebound.  Genitourinary:    Vagina: Vaginal discharge (White vaginal discharge with fishy odor) present.  Skin:    General: Skin is warm and dry.  Neurological:     General: No focal deficit present.     Mental Status: She is alert and oriented to person, place, and time.  Psychiatric:        Mood and Affect: Mood normal.        Behavior: Behavior normal.     Procedures  Results for orders placed or performed during the hospital encounter of 06/30/24 (from the past 24 hours)  POCT urine pregnancy     Status: None   Collection Time: 06/30/24 11:24 AM  Result Value Ref Range   Preg Test, Ur Negative Negative    No results found.   Assessment and Plan :     Discharge Instructions       1. Vaginal discharge (Primary) - Cervicovaginal swab collected in UC and sent to lab for further testing results should be available in 2 to 3 days via MyChart. -If any abnormal results occur from testing patient will be contacted and appropriate treatment provided. - POCT urine pregnancy complaining UC is negative for pregnancy -Continue to monitor symptoms for any change in severity if there is any escalation of current symptoms or development of new symptoms follow-up in ER for further evaluation and management.      Lakeya Mulka B Jyair Kiraly   Yanci Bachtell, [place] B, [place] 06/30/24 1127

## 2024-07-03 ENCOUNTER — Emergency Department (HOSPITAL_COMMUNITY)
Admission: EM | Admit: 2024-07-03 | Discharge: 2024-07-03 | Disposition: A | Discharge status: Home / Self Care | Payer: MEDICAID | Attending: Emergency Medicine | Admitting: Emergency Medicine

## 2024-07-03 ENCOUNTER — Encounter (HOSPITAL_COMMUNITY): Payer: Self-pay | Admitting: Emergency Medicine

## 2024-07-03 ENCOUNTER — Emergency Department (HOSPITAL_COMMUNITY): Payer: MEDICAID

## 2024-07-03 DIAGNOSIS — R072 Precordial pain: Secondary | ICD-10-CM | POA: Diagnosis present

## 2024-07-03 DIAGNOSIS — R079 Chest pain, unspecified: Secondary | ICD-10-CM

## 2024-07-03 LAB — CBC
HCT: 39.9 % (ref 36.0–46.0)
Hemoglobin: 12.3 g/dL (ref 12.0–15.0)
MCH: 25.4 pg — ABNORMAL LOW (ref 26.0–34.0)
MCHC: 30.8 g/dL (ref 30.0–36.0)
MCV: 82.4 fL (ref 80.0–100.0)
Platelets: 262 K/uL (ref 150–400)
RBC: 4.84 MIL/uL (ref 3.87–5.11)
RDW: 14.4 % (ref 11.5–15.5)
WBC: 5.7 K/uL (ref 4.0–10.5)
nRBC: 0 % (ref 0.0–0.2)

## 2024-07-03 LAB — BASIC METABOLIC PANEL WITH GFR
Anion gap: 10 (ref 5–15)
BUN: 13 mg/dL (ref 6–20)
CO2: 22 mmol/L (ref 22–32)
Calcium: 9 mg/dL (ref 8.9–10.3)
Chloride: 107 mmol/L (ref 98–111)
Creatinine, Ser: 0.95 mg/dL (ref 0.44–1.00)
GFR, Estimated: >60 mL/min (ref >60)
Glucose, Bld: 95 mg/dL (ref 70–99)
Potassium: 3.8 mmol/L (ref 3.5–5.1)
Sodium: 139 mmol/L (ref 135–145)

## 2024-07-03 LAB — TROPONIN I (HIGH SENSITIVITY)
Troponin I (High Sensitivity): 2 ng/L (ref <18)
Troponin I (High Sensitivity): <2 ng/L (ref <18)

## 2024-07-03 LAB — HCG, SERUM, QUALITATIVE: Preg, Serum: NEGATIVE

## 2024-07-03 MED ORDER — OMEPRAZOLE 20 MG PO CPDR
20.0000 mg | DELAYED_RELEASE_CAPSULE | Freq: Every day | ORAL | 0 refills | Status: AC
Start: 2024-07-03 — End: ?

## 2024-07-03 NOTE — ED Provider Notes (Signed)
  EMERGENCY DEPARTMENT AT [facility] Provider Note   CSN: [id number] Arrival date & time: 07/03/24  1346     Patient presents with: Chest Pain   Deanna Murray is a 33 y.o. female.   HPI   33 year old female presents to the emergency department with concern for an episode of chest pain this morning.  Patient states when she woke up this morning around 6 AM she had midsternal chest tightness/sharpness.  This lasted a couple minutes and then improved.  She now complains of ongoing fatigue.  States that this feels similar to an episode of chest pain that she had about a month ago.  She was evaluated in the ER and sent home for outpatient follow-up.  Was never established with cardiology.  Currently she is chest pain-free.  Denies any shortness of breath, cough, unilateral leg swelling.  She is not on any birth control or hormones.  No previous history of CAD or concerning family history.  Prior to Admission medications   Medication Sig Start Date End Date Taking? Authorizing Provider  acetaminophen  (TYLENOL ) 500 MG tablet Take 500 mg by mouth every 6 (six) hours as needed.    [provider]  amoxicillin -clavulanate (AUGMENTIN ) 875-125 MG tablet Take 1 tablet by mouth every 12 (twelve) hours. Patient not taking: Reported on 06/30/2024 02/10/23   Dreama, [place]  N, FNP  ibuprofen  (ADVIL ) 800 MG tablet Take 1 tablet (800 mg total) by mouth 3 (three) times daily. 02/10/23   Dreama, [place]  N, FNP  ondansetron  (ZOFRAN -ODT) 4 MG disintegrating tablet Take 1 tablet (4 mg total) by mouth every 8 (eight) hours as needed for nausea or vomiting. Patient not taking: Reported on 06/30/2024 05/27/24   [name], MD  oseltamivir  (TAMIFLU ) 75 MG capsule Take 1 capsule (75 mg total) by mouth every 12 (twelve) hours. Patient not taking: Reported on 06/30/2024 12/05/22   Robinson, John K, PA-C    Allergies: Patient has no known allergies.    Review of Systems   Constitutional:  Positive for fatigue. Negative for fever.  HENT:  Negative for congestion and postnasal drip.   Respiratory:  Negative for cough, chest tightness and shortness of breath.   Cardiovascular:  Positive for chest pain. Negative for palpitations and leg swelling.  Gastrointestinal:  Negative for abdominal pain, diarrhea and vomiting.  Skin:  Negative for rash.  Neurological:  Negative for headaches.    Updated Vital Signs BP 100/77   Pulse 76   Temp 98.3 F (36.8 C) (Oral)   Resp 11   LMP 04/24/2024 (Exact Date)   SpO2 100%   Physical Exam Vitals and nursing note reviewed.  Constitutional:      General: She is not in acute distress.    Appearance: Normal appearance.  HENT:     Head: Normocephalic.     Mouth/Throat:     Mouth: Mucous membranes are moist.  Cardiovascular:     Rate and Rhythm: Normal rate.  Pulmonary:     Effort: Pulmonary effort is normal. No respiratory distress.     Breath sounds: Normal breath sounds.  Abdominal:     Palpations: Abdomen is soft.     Tenderness: There is no abdominal tenderness.  Musculoskeletal:     Right lower leg: No edema.     Left lower leg: No edema.  Skin:    General: Skin is warm.  Neurological:     Mental Status: She is alert and oriented to person, place, and time. Mental  status is at baseline.  Psychiatric:        Mood and Affect: Mood normal.     (all labs ordered are listed, but only abnormal results are displayed) Labs Reviewed  CBC - Abnormal; Notable for the following components:      Result Value   MCH 25.4 (*)    All other components within normal limits  BASIC METABOLIC PANEL WITH GFR  HCG, SERUM, QUALITATIVE  TROPONIN I (HIGH SENSITIVITY)  TROPONIN I (HIGH SENSITIVITY)    EKG: EKG Interpretation Date/Time:  Monday July 03 2024 13:59:01 EDT Ventricular Rate:  92 PR Interval:  158 QRS Duration:  88 QT Interval:  348 QTC Calculation: 430 R Axis:   76  Text Interpretation: Normal  sinus rhythm Nonspecific T wave abnormality Abnormal ECG When compared with ECG of 27-May-2024 15:20, PREVIOUS ECG IS PRESENT Similar to previous Confirmed by Bari Flank [phone]) on 07/03/2024 3:34:05 PM  Radiology: [facility] Chest 2 View Result Date: 07/03/2024 CLINICAL DATA:  Chest pain and nasal drainage. EXAM: CHEST - 2 VIEW COMPARISON:  05/27/2024. FINDINGS: The heart size and mediastinal contours are within normal limits. No consolidation, effusion, or pneumothorax is seen. No acute osseous abnormality. IMPRESSION: No active cardiopulmonary disease. Electronically Signed   By: Leita Birmingham M.D.   On: 07/03/2024 15:24     Procedures   Medications Ordered in the ED - No data to display                                  Medical Decision Making Amount and/or Complexity of Data Reviewed Labs: ordered. Radiology: ordered.  Risk Prescription drug management.   33 year old female presents emergency department after an episode of chest pain.  Currently resolved.  Denies any shortness of breath, leg swelling.  Vitals are normal and stable on arrival.  EKG is baseline for the patient.  Blood work is reassuring, negative troponins without delta.  No return of discomfort.  No shortness of breath, tachycardia.  Doubt PE, PERC negative.  Low suspicion for ACS.  Chest x-ray is unremarkable.  Discussed with the patient adding a PPI and establishing outpatient cardiology follow-up for further testing.  Patient at this time appears safe and stable for discharge and close outpatient follow up. Discharge plan and strict return to ED precautions discussed, patient verbalizes understanding and agreement.     Final diagnoses:  None    ED Discharge Orders     None          Bari Flank HERO, DO 07/03/24 2015

## 2024-07-03 NOTE — ED Triage Notes (Signed)
 Pt c.o chest pain since this morning and nasal drainage.

## 2024-07-03 NOTE — Discharge Instructions (Signed)
 You have been seen and discharged from the emergency department.  Your heart workup was normal today.  However with recurring chest pain it is recommended that you follow-up with cardiology in the office.  A referral has been placed.  Start taking new antacid medicine daily for the possibility that your chest discomfort is GI related.  Follow-up with your primary provider for further evaluation and further care. Take home medications as prescribed. If you have any worsening symptoms or further concerns for your health please return to an emergency department for further evaluation.

## 2024-07-03 NOTE — ED Notes (Signed)
 Unable to obtain labs

## 2024-07-04 LAB — CERVICOVAGINAL ANCILLARY ONLY
Bacterial Vaginitis (gardnerella): POSITIVE — AB
Candida Glabrata: NEGATIVE
Candida Vaginitis: POSITIVE — AB
Chlamydia: NEGATIVE
Comment: NEGATIVE
Comment: NEGATIVE
Comment: NEGATIVE
Comment: NEGATIVE
Comment: NEGATIVE
Comment: NORMAL
Neisseria Gonorrhea: NEGATIVE
Trichomonas: NEGATIVE

## 2024-07-05 ENCOUNTER — Emergency Department (HOSPITAL_COMMUNITY): Payer: MEDICAID

## 2024-07-05 ENCOUNTER — Ambulatory Visit (HOSPITAL_COMMUNITY): Payer: Self-pay

## 2024-07-05 ENCOUNTER — Ambulatory Visit: Payer: MEDICAID | Admitting: Nurse Practitioner

## 2024-07-05 ENCOUNTER — Emergency Department (HOSPITAL_COMMUNITY)
Admission: EM | Admit: 2024-07-05 | Discharge: 2024-07-05 | Disposition: A | Discharge status: Home / Self Care | Payer: MEDICAID | Attending: Emergency Medicine | Admitting: Emergency Medicine

## 2024-07-05 DIAGNOSIS — M545 Low back pain, unspecified: Secondary | ICD-10-CM | POA: Diagnosis not present

## 2024-07-05 DIAGNOSIS — W19XXXA Unspecified fall, initial encounter: Secondary | ICD-10-CM | POA: Insufficient documentation

## 2024-07-05 DIAGNOSIS — S80811A Abrasion, right lower leg, initial encounter: Secondary | ICD-10-CM | POA: Insufficient documentation

## 2024-07-05 DIAGNOSIS — R109 Unspecified abdominal pain: Secondary | ICD-10-CM | POA: Diagnosis not present

## 2024-07-05 DIAGNOSIS — M25572 Pain in left ankle and joints of left foot: Secondary | ICD-10-CM | POA: Diagnosis not present

## 2024-07-05 DIAGNOSIS — T7411XA Adult physical abuse, confirmed, initial encounter: Secondary | ICD-10-CM | POA: Diagnosis present

## 2024-07-05 DIAGNOSIS — M25562 Pain in left knee: Secondary | ICD-10-CM | POA: Diagnosis not present

## 2024-07-05 DIAGNOSIS — S80812A Abrasion, left lower leg, initial encounter: Secondary | ICD-10-CM | POA: Insufficient documentation

## 2024-07-05 DIAGNOSIS — M25561 Pain in right knee: Secondary | ICD-10-CM | POA: Insufficient documentation

## 2024-07-05 LAB — CBC
HCT: 36.9 % (ref 36.0–46.0)
Hemoglobin: 11.3 g/dL — ABNORMAL LOW (ref 12.0–15.0)
MCH: 25.1 pg — ABNORMAL LOW (ref 26.0–34.0)
MCHC: 30.6 g/dL (ref 30.0–36.0)
MCV: 82 fL (ref 80.0–100.0)
Platelets: 249 K/uL (ref 150–400)
RBC: 4.5 MIL/uL (ref 3.87–5.11)
RDW: 14.5 % (ref 11.5–15.5)
WBC: 5.4 K/uL (ref 4.0–10.5)
nRBC: 0 % (ref 0.0–0.2)

## 2024-07-05 LAB — URINALYSIS, ROUTINE W REFLEX MICROSCOPIC
Bacteria, UA: NONE SEEN
Bilirubin Urine: NEGATIVE
Glucose, UA: NEGATIVE mg/dL
Ketones, ur: NEGATIVE mg/dL
Leukocytes,Ua: NEGATIVE
Nitrite: NEGATIVE
Protein, ur: NEGATIVE mg/dL
RBC / HPF: >50 RBC/hpf (ref 0–5)
Specific Gravity, Urine: 1.01 (ref 1.005–1.030)
pH: 7 (ref 5.0–8.0)

## 2024-07-05 LAB — COMPREHENSIVE METABOLIC PANEL WITH GFR
ALT: 14 U/L (ref 0–44)
AST: 19 U/L (ref 15–41)
Albumin: 4 g/dL (ref 3.5–5.0)
Alkaline Phosphatase: 57 U/L (ref 38–126)
Anion gap: 11 (ref 5–15)
BUN: 9 mg/dL (ref 6–20)
CO2: 21 mmol/L — ABNORMAL LOW (ref 22–32)
Calcium: 9.1 mg/dL (ref 8.9–10.3)
Chloride: 107 mmol/L (ref 98–111)
Creatinine, Ser: 0.77 mg/dL (ref 0.44–1.00)
GFR, Estimated: >60 mL/min (ref >60)
Glucose, Bld: 115 mg/dL — ABNORMAL HIGH (ref 70–99)
Potassium: 4 mmol/L (ref 3.5–5.1)
Sodium: 139 mmol/L (ref 135–145)
Total Bilirubin: 0.2 mg/dL (ref 0.0–1.2)
Total Protein: 7.2 g/dL (ref 6.5–8.1)

## 2024-07-05 LAB — HCG, SERUM, QUALITATIVE: Preg, Serum: NEGATIVE

## 2024-07-05 LAB — LIPASE, BLOOD: Lipase: 75 U/L — ABNORMAL HIGH (ref 11–51)

## 2024-07-05 MED ORDER — HYDROCODONE-ACETAMINOPHEN 5-325 MG PO TABS
2.0000 | ORAL_TABLET | Freq: Once | ORAL | Status: AC
  Administered 2024-07-05: 2 via ORAL
  Filled 2024-07-05: qty 2

## 2024-07-05 MED ORDER — METRONIDAZOLE 500 MG PO TABS: 500.0000 mg | ORAL_TABLET | Freq: Two times a day (BID) | ORAL | 0 refills | Status: AC

## 2024-07-05 MED ORDER — IOHEXOL 300 MG/ML  SOLN
100.0000 mL | Freq: Once | INTRAMUSCULAR | Status: AC | PRN
  Administered 2024-07-05: 100 mL via INTRAVENOUS

## 2024-07-05 MED ORDER — FLUCONAZOLE 150 MG PO TABS: 150.0000 mg | ORAL_TABLET | Freq: Once | ORAL | 0 refills | Status: AC

## 2024-07-05 MED ORDER — KETOROLAC TROMETHAMINE 15 MG/ML IJ SOLN
15.0000 mg | Freq: Once | INTRAMUSCULAR | Status: AC
  Administered 2024-07-05: 15 mg via INTRAVENOUS
  Filled 2024-07-05: qty 1
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
## 2024-07-05 NOTE — Progress Notes (Deleted)
 Office Visit    Patient Name: Deanna Murray Date of Encounter: 07/05/2024  Primary Care Provider:  Pcp, No Primary Cardiologist:  None  Chief Complaint    33 year old female with a history of chest pain, preeclampsia, seizure in childhood, anemia, bipolar disorder, and anxiety who presents for [facility] new patient evaluation.  Past Medical History    Past Medical History:  Diagnosis Date   Anemia    Anxiety    Bipolar 1 disorder (HCC)    Gonorrhea    Headache    tylenol  prn   HSV (herpes simplex virus) infection    postpartum depression 2007   Preeclampsia 2007   2007 with first pregnancy only -no problems since   Pregnancy    7 months   Seizure (HCC)    last one at age 47 yrs old - none since   SVD (spontaneous vaginal delivery)    x 3   Past Surgical History:  Procedure Laterality Date   CESAREAN SECTION N/A 09/08/2016   Procedure: CESAREAN SECTION;  Surgeon: Glenys GORMAN Birk, MD;  Location: [facility] SU[facility];  Service: Obstetrics;  Laterality: N/A;   NO PAST SURGERIES      Allergies  No Known Allergies   Labs/Other Studies Reviewed    The following studies were reviewed today:     Recent Labs: 08/10/2023: ALT 16 07/03/2024: BUN 13; Creatinine, Ser 0.95; Hemoglobin 12.3; Platelets 262; Potassium 3.8; Sodium 139  Recent Lipid Panel No results found for: CHOL, TRIG, HDL, CHOLHDL, VLDL, LDLCALC, LDLDIRECT  History of Present Illness    33 year old female with the above past medical history including chest pain, preeclampsia, seizure in childhood, anemia, bipolar disorder, and anxiety.  She presented to the ED on 07/03/2024 in the setting of chest pain, fatigue.  She described sudden onset midsternal chest tightness that lasted for minutes and resolved spontaneously.  EKG was unremarkable, troponin was negative x 2, urine pregnancy test was also negative.  She had a similar episode 1 month prior and was evaluated in the ED, workup was  negative. She was referred to cardiology.  She presents today for new patient evaluation.  CC: -Initial onset:  -Frequency/Duration:  -Associated symptoms:  -Aggravating/alleviating factors:  -Syncope/near syncope: -Prior cardiac history: -Prior ECG:  -Prior workup: -Prior treatment:  -Possible medication interactions:  -Caffeine:  -Alcohol:  -Tobacco: -OTC supplements:  -Comorbidities:  -Exercise level:  -Labs:  -Cardiac ROS: no chest pain, no shortness of breath, no PND, no orthopnea, no LE edema. She denies any headaches. -Family history:     Home Medications    Current Outpatient Medications  Medication Sig Dispense Refill   acetaminophen  (TYLENOL ) 500 MG tablet Take 500 mg by mouth every 6 (six) hours as needed.     amoxicillin -clavulanate (AUGMENTIN ) 875-125 MG tablet Take 1 tablet by mouth every 12 (twelve) hours. (Patient not taking: Reported on 06/30/2024) 14 tablet 0   ibuprofen  (ADVIL ) 800 MG tablet Take 1 tablet (800 mg total) by mouth 3 (three) times daily. 21 tablet 0   omeprazole  (PRILOSEC) 20 MG capsule Take 1 capsule (20 mg total) by mouth daily. 30 capsule 0   ondansetron  (ZOFRAN -ODT) 4 MG disintegrating tablet Take 1 tablet (4 mg total) by mouth every 8 (eight) hours as needed for nausea or vomiting. (Patient not taking: Reported on 06/30/2024) 12 tablet 0   oseltamivir  (TAMIFLU ) 75 MG capsule Take 1 capsule (75 mg total) by mouth every 12 (twelve) hours. (Patient not taking: Reported on 06/30/2024)  10 capsule 0   No current facility-administered medications for this visit.     Review of Systems    ***.  All other systems reviewed and are otherwise negative except as noted above.    Physical Exam    VS:  LMP 04/24/2024 (Exact Date)  , BMI There is no height or weight on file to calculate BMI.     GEN: Well nourished, well developed, in no acute distress. HEENT: normal. Neck: Supple, no JVD, carotid bruits, or masses. Cardiac: RRR, no murmurs, rubs, or  gallops. No clubbing, cyanosis, edema.  Radials/DP/PT 2+ and equal bilaterally.  Respiratory:  Respirations regular and unlabored, clear to auscultation bilaterally. GI: Soft, nontender, nondistended, BS + x 4. MS: no deformity or atrophy. Skin: warm and dry, no rash. Neuro:  Strength and sensation are intact. Psych: Normal affect.  Accessory Clinical Findings    ECG personally reviewed by me today -    - no acute changes.   Lab Results  Component Value Date   WBC 5.7 07/03/2024   HGB 12.3 07/03/2024   HCT 39.9 07/03/2024   MCV 82.4 07/03/2024   PLT 262 07/03/2024   Lab Results  Component Value Date   CREATININE 0.95 07/03/2024   BUN 13 07/03/2024   NA 139 07/03/2024   K 3.8 07/03/2024   CL 107 07/03/2024   CO2 22 07/03/2024   Lab Results  Component Value Date   ALT 16 08/10/2023   AST 14 (L) 08/10/2023   ALKPHOS 53 08/10/2023   BILITOT 0.7 08/10/2023   No results found for: CHOL, HDL, LDLCALC, LDLDIRECT, TRIG, CHOLHDL  No results found for: HGBA1C  Assessment & Plan    1.  ***  No BP recorded.  {Refresh Note OR Click here to enter BP  :1}***   Damien JAYSON Braver, NP 07/05/2024, 6:22 AM

## 2024-07-05 NOTE — Discharge Instructions (Addendum)
 Evaluation today was overall reassuring.  Please follow-up with your PCP.  As we discussed on the CT scan there is a gallstone in your gallbladder.  Nothing to do for that now but if you do develop right upper quadrant pain, nausea and vomiting or diarrhea please return for further evaluation.  Recommend Tylenol  and ibuprofen  for pain at home after your fall.  You can also apply ice to areas that hurt.

## 2024-07-31 NOTE — Progress Notes (Incomplete)
   33 y.o. [id number] female here for irregular menses. Single.  No LMP recorded.    She reports ***. Urine sample provided: ***  Birth control: *** Last mammogram: *** Sexually active: ***    GYN HISTORY: ***  OB History  Gravida Para Term Preterm AB Living  4 4 4   4   SAB IAB Ectopic Multiple Live Births     0 4    # Outcome Date GA Lbr Len/2nd Weight Sex Type Anes PTL Lv  4 Term 09/08/16 [redacted]w[redacted]d  6 lb 13 oz (3.09 kg) F CS-LTranv Spinal  LIV  3 Term 02/01/14 [redacted]w[redacted]d 07:02 / 01:26 8 lb 7.5 oz (3.84 kg) M Vag-Spont EPI  LIV  2 Term 08/28/08 [redacted]w[redacted]d  7 lb 10 oz (3.459 kg) F Vag-Spont EPI  LIV  1 Term 06/29/06 [redacted]w[redacted]d  7 lb 15 oz (3.6 kg) M Vag-Spont None  LIV   Past Medical History:  Diagnosis Date   Anemia    Anxiety    Bipolar 1 disorder (HCC)    Gonorrhea    Headache    tylenol  prn   HSV (herpes simplex virus) infection    postpartum depression 2007   Preeclampsia 2007   2007 with first pregnancy only -no problems since   Pregnancy    7 months   Seizure (HCC)    last one at age 66 yrs old - none since   SVD (spontaneous vaginal delivery)    x 3   Past Surgical History:  Procedure Laterality Date   CESAREAN SECTION N/A 09/08/2016   Procedure: CESAREAN SECTION;  Surgeon: Glenys GORMAN Birk, MD;  Location: [facility] BIRTHING SUITES;  Service: Obstetrics;  Laterality: N/A;   NO PAST SURGERIES     Current Outpatient Medications on File Prior to Visit  Medication Sig Dispense Refill   acetaminophen  (TYLENOL ) 500 MG tablet Take 500 mg by mouth every 6 (six) hours as needed.     amoxicillin -clavulanate (AUGMENTIN ) 875-125 MG tablet Take 1 tablet by mouth every 12 (twelve) hours. (Patient not taking: Reported on 06/30/2024) 14 tablet 0   ibuprofen  (ADVIL ) 800 MG tablet Take 1 tablet (800 mg total) by mouth 3 (three) times daily. 21 tablet 0   omeprazole  (PRILOSEC) 20 MG capsule Take 1 capsule (20 mg total) by mouth daily. 30 capsule 0   ondansetron  (ZOFRAN -ODT) 4 MG disintegrating tablet  Take 1 tablet (4 mg total) by mouth every 8 (eight) hours as needed for nausea or vomiting. (Patient not taking: Reported on 06/30/2024) 12 tablet 0   oseltamivir  (TAMIFLU ) 75 MG capsule Take 1 capsule (75 mg total) by mouth every 12 (twelve) hours. (Patient not taking: Reported on 06/30/2024) 10 capsule 0   No current facility-administered medications on file prior to visit.   No Known Allergies    PE There were no vitals filed for this visit. There is no height or weight on file to calculate BMI.  Physical Exam    Assessment and Plan:        There are no diagnoses linked to this encounter.  Clotilda FORBES Pa, CMA

## 2024-08-01 ENCOUNTER — Encounter: Payer: MEDICAID | Admitting: Obstetrics and Gynecology

## 2024-08-13 ENCOUNTER — Other Ambulatory Visit: Payer: Self-pay

## 2024-08-13 ENCOUNTER — Emergency Department (HOSPITAL_COMMUNITY)
Admission: EM | Admit: 2024-08-13 | Discharge: 2024-08-13 | Disposition: A | Discharge status: Home / Self Care | Payer: MEDICAID

## 2024-08-13 ENCOUNTER — Encounter (HOSPITAL_COMMUNITY): Payer: Self-pay

## 2024-08-13 ENCOUNTER — Emergency Department (HOSPITAL_COMMUNITY): Payer: MEDICAID

## 2024-08-13 DIAGNOSIS — R079 Chest pain, unspecified: Secondary | ICD-10-CM | POA: Diagnosis present

## 2024-08-13 DIAGNOSIS — R059 Cough, unspecified: Secondary | ICD-10-CM | POA: Diagnosis not present

## 2024-08-13 DIAGNOSIS — R0602 Shortness of breath: Secondary | ICD-10-CM | POA: Diagnosis not present

## 2024-08-13 LAB — CBC
HCT: 39 % (ref 36.0–46.0)
Hemoglobin: 12 g/dL (ref 12.0–15.0)
MCH: 25.9 pg — ABNORMAL LOW (ref 26.0–34.0)
MCHC: 30.8 g/dL (ref 30.0–36.0)
MCV: 84.2 fL (ref 80.0–100.0)
Platelets: 307 K/uL (ref 150–400)
RBC: 4.63 MIL/uL (ref 3.87–5.11)
RDW: 14.6 % (ref 11.5–15.5)
WBC: 7.4 K/uL (ref 4.0–10.5)
nRBC: 0 % (ref 0.0–0.2)

## 2024-08-13 LAB — BASIC METABOLIC PANEL WITH GFR
Anion gap: 15 (ref 5–15)
BUN: 7 mg/dL (ref 6–20)
CO2: 21 mmol/L — ABNORMAL LOW (ref 22–32)
Calcium: 8.9 mg/dL (ref 8.9–10.3)
Chloride: 105 mmol/L (ref 98–111)
Creatinine, Ser: 0.78 mg/dL (ref 0.44–1.00)
GFR, Estimated: >60 mL/min (ref >60)
Glucose, Bld: 104 mg/dL — ABNORMAL HIGH (ref 70–99)
Potassium: 4 mmol/L (ref 3.5–5.1)
Sodium: 141 mmol/L (ref 135–145)

## 2024-08-13 LAB — TROPONIN I (HIGH SENSITIVITY)
Troponin I (High Sensitivity): <2 ng/L (ref <18)
Troponin I (High Sensitivity): <2 ng/L (ref <18)

## 2024-08-13 LAB — RESP PANEL BY RT-PCR (RSV, FLU A&B, COVID)  RVPGX2
Influenza A by PCR: NEGATIVE
Influenza B by PCR: NEGATIVE
Resp Syncytial Virus by PCR: NEGATIVE
SARS Coronavirus 2 by RT PCR: NEGATIVE

## 2024-08-13 LAB — HCG, SERUM, QUALITATIVE: Preg, Serum: NEGATIVE

## 2024-08-13 MED ORDER — ALPRAZOLAM 0.25 MG PO TABS
0.2500 mg | ORAL_TABLET | Freq: Once | ORAL | Status: AC
  Administered 2024-08-13: 0.25 mg via ORAL
  Filled 2024-08-13: qty 1

## 2024-08-13 NOTE — ED Notes (Signed)
Ambulated to RR.  °

## 2024-08-13 NOTE — ED Notes (Signed)
Phlebotomy to collect troponin

## 2024-08-13 NOTE — Discharge Instructions (Signed)
 Your heart enzymes were normal.  Your EKG was also reassuring without signs of damage to your heart and without abnormal heartbeat.  Your workup today was overall reassuring.  We would like you to follow-up with cardiology as discussed for possible Holter monitor to evaluate for arrhythmia.  Please return if you develop fevers, chills, passout, difficulty breathing, new or worsening chest pain or any new symptoms that are concerning to you.

## 2024-08-13 NOTE — ED Triage Notes (Signed)
 Pt c.o central stabbing chest pains since last night, worse when she breathes. Pt states she's been here for it before and no one can tell her what is causing it. Pt also c.o SOB and mild cough.

## 2024-08-13 NOTE — ED Provider Notes (Signed)
 [place] EMERGENCY DEPARTMENT AT [facility] Provider Note   CSN: [id number] Arrival date & time: 08/13/24  9087     Patient presents with: Chest Pain   Deanna Murray is a 33 y.o. female.   This is a 33 year old female presenting emergency department for chest pain.  Reports she has been having intermittent chest pain for the past several months.  Primarily occurring during interpersonal/stressful events at home with children or significant other.  Reports having some chest pain this morning that was particularly worse.  Felt some palpitations and chest tightness.  No syncope.  She notes that she will often feel rapid heart rate when she is having her chest pain.  Her pain has improved since being in the emergency department, but is still mildly present.  Low risk for PE based off Wells criteria.  She has been unable to follow-up with cardiology   Chest Pain      Prior to Admission medications   Medication Sig Start Date End Date Taking? Authorizing Provider  acetaminophen  (TYLENOL ) 500 MG tablet Take 500 mg by mouth every 6 (six) hours as needed.    [provider]  amoxicillin -clavulanate (AUGMENTIN ) 875-125 MG tablet Take 1 tablet by mouth every 12 (twelve) hours. Patient not taking: Reported on 06/30/2024 02/10/23   Dreama, [place]  N, FNP  ibuprofen  (ADVIL ) 800 MG tablet Take 1 tablet (800 mg total) by mouth 3 (three) times daily. 02/10/23   Dreama, [place]  N, FNP  omeprazole  (PRILOSEC) 20 MG capsule Take 1 capsule (20 mg total) by mouth daily. 07/03/24   Horton, Roxie HERO, DO  ondansetron  (ZOFRAN -ODT) 4 MG disintegrating tablet Take 1 tablet (4 mg total) by mouth every 8 (eight) hours as needed for nausea or vomiting. Patient not taking: Reported on 06/30/2024 05/27/24   [name], MD  oseltamivir  (TAMIFLU ) 75 MG capsule Take 1 capsule (75 mg total) by mouth every 12 (twelve) hours. Patient not taking: Reported on 06/30/2024 12/05/22   Robinson, John  K, PA-C    Allergies: Patient has no known allergies.    Review of Systems  Cardiovascular:  Positive for chest pain.    Updated Vital Signs BP 108/88   Pulse 75   Temp 98.6 F (37 C)   Resp 20   Ht 5' 4 (1.626 m)   LMP 07/03/2024 (Approximate)   SpO2 100%   BMI 38.22 kg/m   Physical Exam Vitals and nursing note reviewed.  Constitutional:      General: She is not in acute distress.    Appearance: She is not toxic-appearing.  HENT:     Head: Normocephalic.  Cardiovascular:     Rate and Rhythm: Normal rate and regular rhythm.     Pulses:          Radial pulses are 2+ on the right side and 2+ on the left side.     Heart sounds: Normal heart sounds.  Pulmonary:     Breath sounds: Normal breath sounds.  Musculoskeletal:     Right lower leg: No edema.     Left lower leg: No edema.  Skin:    General: Skin is warm and dry.     Capillary Refill: Capillary refill takes less than 2 seconds.  Neurological:     Mental Status: She is alert and oriented to person, place, and time.  Psychiatric:        Mood and Affect: Mood normal.        Behavior: Behavior normal.     (  all labs ordered are listed, but only abnormal results are displayed) Labs Reviewed  BASIC METABOLIC PANEL WITH GFR - Abnormal; Notable for the following components:      Result Value   CO2 21 (*)    Glucose, Bld 104 (*)    All other components within normal limits  CBC - Abnormal; Notable for the following components:   MCH 25.9 (*)    All other components within normal limits  RESP PANEL BY RT-PCR (RSV, FLU A&B, COVID)  RVPGX2  HCG, SERUM, QUALITATIVE  TROPONIN I (HIGH SENSITIVITY)  TROPONIN I (HIGH SENSITIVITY)    EKG: None  Radiology: DG Chest 2 View Result Date: 08/13/2024 CLINICAL DATA:  Central chest pain. EXAM: CHEST - 2 VIEW COMPARISON:  July 03, 2024 FINDINGS: The heart size and mediastinal contours are within normal limits. Both lungs are clear. The visualized skeletal structures  are unremarkable. IMPRESSION: No active cardiopulmonary disease. Electronically Signed   By: Suzen Dials M.D.   On: 08/13/2024 10:04     Procedures   Medications Ordered in the ED  ALPRAZolam SHEFFIELD) tablet 0.25 mg (0.25 mg Oral Given 08/13/24 1056)                                    Medical Decision Making This is a obese 33 year old female with bipolar, anxiety who presented to the emergency department for chest pain.  She is afebrile nontachycardic, normotensive.  Appears to be normal sinus rhythm on the monitor as interpreted by me with a heart rate in the low 80s.  Her EKG similarly is normal sinus rhythm without ST segment changes to indicate ischemia.  Normal intervals.  No QTc prolongation.  Her initial troponin is negative.  It sounds as though her symptoms could be secondary to panic attack/anxiety as a seemingly coincide with interpersonal/stressful events.  However she does note that she feels her heart racing/palpitating at times during these episodes.  She does appear to be normal sinus rhythm here.  Encouraged patient to follow-up with cardiology for possible Holter monitoring to exclude arrhythmia as cause of her symptoms.  Her chest x-ray without pneumonia or pneumothorax.  Wells/PERC negative for PE.  Pregnancy test is negative.  She has no leukocytosis to suggest infectious process.  No anemia that would explain her symptoms today.  She also has no significant metabolic derangements.  Will get a repeat troponin as her symptoms did start this morning, if negative will discharge with cardiology referral.  Amount and/or Complexity of Data Reviewed External Data Reviewed:     Details: It seems she has been seen several times in the ED with similar chest pain and has n negative workup Labs: ordered. Decision-making details documented in ED Course. Radiology: ordered and independent interpretation performed. ECG/medicine tests: independent interpretation  performed.  Risk Prescription drug management. Diagnosis or treatment significantly limited by social determinants of health. Risk Details: Poor access to health       Final diagnoses:  Chest pain, unspecified type    ED Discharge Orders          Ordered    Ambulatory referral to Cardiology       Comments: If you have not heard from the Cardiology office within the next 72 hours please call [phone].   08/13/24 1230               Neysa Caron PARAS, DO 08/13/24 1543

## 2024-08-13 NOTE — ED Notes (Signed)
 Patient transported to X-ray

## 2024-08-15 ENCOUNTER — Emergency Department (HOSPITAL_COMMUNITY)
Admission: EM | Admit: 2024-08-15 | Discharge: 2024-08-15 | Disposition: A | Discharge status: Home / Self Care | Payer: MEDICAID | Attending: Emergency Medicine | Admitting: Emergency Medicine

## 2024-08-15 ENCOUNTER — Emergency Department (HOSPITAL_COMMUNITY): Payer: MEDICAID

## 2024-08-15 ENCOUNTER — Encounter (HOSPITAL_COMMUNITY): Payer: Self-pay

## 2024-08-15 ENCOUNTER — Other Ambulatory Visit: Payer: Self-pay

## 2024-08-15 DIAGNOSIS — R202 Paresthesia of skin: Secondary | ICD-10-CM | POA: Diagnosis not present

## 2024-08-15 DIAGNOSIS — R200 Anesthesia of skin: Secondary | ICD-10-CM | POA: Insufficient documentation

## 2024-08-15 DIAGNOSIS — R0789 Other chest pain: Secondary | ICD-10-CM | POA: Insufficient documentation

## 2024-08-15 LAB — CBC
HCT: 38 % (ref 36.0–46.0)
Hemoglobin: 11.7 g/dL — ABNORMAL LOW (ref 12.0–15.0)
MCH: 25.5 pg — ABNORMAL LOW (ref 26.0–34.0)
MCHC: 30.8 g/dL (ref 30.0–36.0)
MCV: 83 fL (ref 80.0–100.0)
Platelets: 321 K/uL (ref 150–400)
RBC: 4.58 MIL/uL (ref 3.87–5.11)
RDW: 14.5 % (ref 11.5–15.5)
WBC: 7.3 K/uL (ref 4.0–10.5)
nRBC: 0 % (ref 0.0–0.2)

## 2024-08-15 LAB — TROPONIN I (HIGH SENSITIVITY)
Troponin I (High Sensitivity): <2 ng/L (ref <18)
Troponin I (High Sensitivity): <2 ng/L (ref <18)

## 2024-08-15 LAB — BASIC METABOLIC PANEL WITH GFR
Anion gap: 10 (ref 5–15)
BUN: 6 mg/dL (ref 6–20)
CO2: 23 mmol/L (ref 22–32)
Calcium: 8.8 mg/dL — ABNORMAL LOW (ref 8.9–10.3)
Chloride: 105 mmol/L (ref 98–111)
Creatinine, Ser: 0.85 mg/dL (ref 0.44–1.00)
GFR, Estimated: >60 mL/min (ref >60)
Glucose, Bld: 120 mg/dL — ABNORMAL HIGH (ref 70–99)
Potassium: 3.8 mmol/L (ref 3.5–5.1)
Sodium: 138 mmol/L (ref 135–145)

## 2024-08-15 LAB — HCG, SERUM, QUALITATIVE: Preg, Serum: NEGATIVE

## 2024-08-15 MED ORDER — ACETAMINOPHEN 500 MG PO TABS
1000.0000 mg | ORAL_TABLET | Freq: Once | ORAL | Status: AC
  Administered 2024-08-15: 1000 mg via ORAL
  Filled 2024-08-15: qty 2
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
## 2024-08-15 NOTE — ED Triage Notes (Addendum)
 Pt reports L sided chest pain with intermittent radiation to the L arm x2 weeks. Pt seen recently for same. Pt also endorses L knee pain. No neuro sxs.

## 2024-08-15 NOTE — Discharge Instructions (Addendum)
 Lab work and exam are reassuring today.  Please take Tylenol  and ibuprofen  as needed for pain.  It is advised to follow-up with primary care for further management of left arm weakness.  I provided the number for a family medicine center.  If any concerning symptoms arise please return to ED for further evaluation.  Symptoms would include worsening chest pain, shortness of breath, fainting, or dizziness.

## 2024-08-21 ENCOUNTER — Ambulatory Visit: Payer: MEDICAID | Attending: Physician Assistant | Admitting: Physician Assistant

## 2024-10-12 ENCOUNTER — Encounter (HOSPITAL_BASED_OUTPATIENT_CLINIC_OR_DEPARTMENT_OTHER): Payer: Self-pay
